# Patient Record
Sex: Male | Born: 1950 | Race: White | Hispanic: No | Marital: Married | Smoking: Former smoker
Health system: Southern US, Community
[De-identification: ages and names within clinical notes are randomized; demographics above are authoritative.]

## PROBLEM LIST (undated history)

## (undated) DIAGNOSIS — K219 Gastro-esophageal reflux disease without esophagitis: Secondary | ICD-10-CM

## (undated) DIAGNOSIS — D649 Anemia, unspecified: Secondary | ICD-10-CM

## (undated) DIAGNOSIS — E785 Hyperlipidemia, unspecified: Secondary | ICD-10-CM

## (undated) DIAGNOSIS — I10 Essential (primary) hypertension: Secondary | ICD-10-CM

## (undated) DIAGNOSIS — I499 Cardiac arrhythmia, unspecified: Secondary | ICD-10-CM

## (undated) DIAGNOSIS — C4490 Unspecified malignant neoplasm of skin, unspecified: Secondary | ICD-10-CM

## (undated) DIAGNOSIS — R011 Cardiac murmur, unspecified: Secondary | ICD-10-CM

## (undated) DIAGNOSIS — G319 Degenerative disease of nervous system, unspecified: Secondary | ICD-10-CM

## (undated) DIAGNOSIS — Z8719 Personal history of other diseases of the digestive system: Secondary | ICD-10-CM

## (undated) DIAGNOSIS — E119 Type 2 diabetes mellitus without complications: Secondary | ICD-10-CM

## (undated) HISTORY — DX: Gastro-esophageal reflux disease without esophagitis: K21.9

## (undated) HISTORY — DX: Unspecified malignant neoplasm of skin, unspecified: C44.90

## (undated) HISTORY — PX: ULNAR NERVE REPAIR: SHX2594

## (undated) HISTORY — PX: HEMORRHOID SURGERY: SHX153

## (undated) HISTORY — PX: CATARACT EXTRACTION, BILATERAL: SHX1313

## (undated) HISTORY — PX: APPENDECTOMY: SHX54

## (undated) HISTORY — DX: Anemia, unspecified: D64.9

## (undated) HISTORY — PX: POLYPECTOMY: SHX149

## (undated) HISTORY — PX: CHOLECYSTECTOMY: SHX55

## (undated) MED FILL — Iron Sucrose Inj 20 MG/ML (Fe Equiv): INTRAVENOUS | Qty: 10 | Status: AC

---

## 1999-04-03 ENCOUNTER — Ambulatory Visit: Admission: RE | Admit: 1999-04-03 | Discharge: 1999-04-03 | Payer: Self-pay

## 2004-06-12 ENCOUNTER — Emergency Department (HOSPITAL_COMMUNITY): Admission: EM | Admit: 2004-06-12 | Discharge: 2004-06-12 | Payer: Self-pay | Admitting: Emergency Medicine

## 2011-11-14 DIAGNOSIS — Z860100 Personal history of colon polyps, unspecified: Secondary | ICD-10-CM | POA: Insufficient documentation

## 2011-11-14 HISTORY — DX: Personal history of colon polyps, unspecified: Z86.0100

## 2011-11-14 HISTORY — DX: Personal history of colonic polyps: Z86.010

## 2012-08-28 DIAGNOSIS — I10 Essential (primary) hypertension: Secondary | ICD-10-CM

## 2012-08-28 DIAGNOSIS — M109 Gout, unspecified: Secondary | ICD-10-CM

## 2012-08-28 HISTORY — DX: Gout, unspecified: M10.9

## 2012-08-28 HISTORY — DX: Essential (primary) hypertension: I10

## 2015-10-06 DIAGNOSIS — R768 Other specified abnormal immunological findings in serum: Secondary | ICD-10-CM

## 2015-10-06 HISTORY — DX: Other specified abnormal immunological findings in serum: R76.8

## 2015-10-07 DIAGNOSIS — M65332 Trigger finger, left middle finger: Secondary | ICD-10-CM

## 2015-10-07 HISTORY — DX: Trigger finger, left middle finger: M65.332

## 2016-02-02 DIAGNOSIS — Z79899 Other long term (current) drug therapy: Secondary | ICD-10-CM

## 2016-02-02 HISTORY — DX: Other long term (current) drug therapy: Z79.899

## 2016-09-07 DIAGNOSIS — R269 Unspecified abnormalities of gait and mobility: Secondary | ICD-10-CM | POA: Insufficient documentation

## 2016-09-07 HISTORY — DX: Unspecified abnormalities of gait and mobility: R26.9

## 2016-09-08 DIAGNOSIS — G2581 Restless legs syndrome: Secondary | ICD-10-CM | POA: Insufficient documentation

## 2016-09-08 HISTORY — DX: Restless legs syndrome: G25.81

## 2017-11-06 DIAGNOSIS — R195 Other fecal abnormalities: Secondary | ICD-10-CM

## 2017-11-06 HISTORY — DX: Other fecal abnormalities: R19.5

## 2018-05-02 DIAGNOSIS — Z8719 Personal history of other diseases of the digestive system: Secondary | ICD-10-CM | POA: Insufficient documentation

## 2018-05-02 HISTORY — DX: Personal history of other diseases of the digestive system: Z87.19

## 2018-09-03 DIAGNOSIS — L304 Erythema intertrigo: Secondary | ICD-10-CM

## 2018-09-03 HISTORY — DX: Erythema intertrigo: L30.4

## 2020-03-04 DIAGNOSIS — E1169 Type 2 diabetes mellitus with other specified complication: Secondary | ICD-10-CM

## 2020-03-04 HISTORY — DX: Type 2 diabetes mellitus with other specified complication: E11.69

## 2021-10-27 DIAGNOSIS — Z8616 Personal history of COVID-19: Secondary | ICD-10-CM

## 2021-10-27 HISTORY — DX: Personal history of COVID-19: Z86.16

## 2021-11-10 ENCOUNTER — Emergency Department (HOSPITAL_COMMUNITY): Payer: Medicare PPO

## 2021-11-10 ENCOUNTER — Inpatient Hospital Stay (HOSPITAL_COMMUNITY): Payer: Medicare PPO

## 2021-11-10 ENCOUNTER — Other Ambulatory Visit: Payer: Self-pay

## 2021-11-10 ENCOUNTER — Encounter (HOSPITAL_COMMUNITY): Payer: Self-pay | Admitting: Emergency Medicine

## 2021-11-10 ENCOUNTER — Inpatient Hospital Stay (HOSPITAL_COMMUNITY)
Admission: EM | Admit: 2021-11-10 | Discharge: 2021-11-16 | DRG: 871 | Disposition: A | Discharge status: Home Health | Payer: Medicare PPO | Attending: Internal Medicine | Admitting: Internal Medicine

## 2021-11-10 DIAGNOSIS — J9601 Acute respiratory failure with hypoxia: Secondary | ICD-10-CM | POA: Diagnosis present

## 2021-11-10 DIAGNOSIS — R791 Abnormal coagulation profile: Secondary | ICD-10-CM | POA: Diagnosis present

## 2021-11-10 DIAGNOSIS — T380X5A Adverse effect of glucocorticoids and synthetic analogues, initial encounter: Secondary | ICD-10-CM | POA: Diagnosis present

## 2021-11-10 DIAGNOSIS — E785 Hyperlipidemia, unspecified: Secondary | ICD-10-CM | POA: Diagnosis present

## 2021-11-10 DIAGNOSIS — K219 Gastro-esophageal reflux disease without esophagitis: Secondary | ICD-10-CM | POA: Diagnosis present

## 2021-11-10 DIAGNOSIS — Z9109 Other allergy status, other than to drugs and biological substances: Secondary | ICD-10-CM

## 2021-11-10 DIAGNOSIS — A4189 Other specified sepsis: Principal | ICD-10-CM | POA: Diagnosis present

## 2021-11-10 DIAGNOSIS — K449 Diaphragmatic hernia without obstruction or gangrene: Secondary | ICD-10-CM | POA: Diagnosis present

## 2021-11-10 DIAGNOSIS — Z7982 Long term (current) use of aspirin: Secondary | ICD-10-CM

## 2021-11-10 DIAGNOSIS — Z993 Dependence on wheelchair: Secondary | ICD-10-CM | POA: Diagnosis not present

## 2021-11-10 DIAGNOSIS — G4733 Obstructive sleep apnea (adult) (pediatric): Secondary | ICD-10-CM | POA: Diagnosis present

## 2021-11-10 DIAGNOSIS — K2100 Gastro-esophageal reflux disease with esophagitis, without bleeding: Secondary | ICD-10-CM | POA: Diagnosis not present

## 2021-11-10 DIAGNOSIS — U071 COVID-19: Secondary | ICD-10-CM | POA: Diagnosis present

## 2021-11-10 DIAGNOSIS — Q453 Other congenital malformations of pancreas and pancreatic duct: Secondary | ICD-10-CM

## 2021-11-10 DIAGNOSIS — K8051 Calculus of bile duct without cholangitis or cholecystitis with obstruction: Secondary | ICD-10-CM | POA: Diagnosis not present

## 2021-11-10 DIAGNOSIS — G319 Degenerative disease of nervous system, unspecified: Secondary | ICD-10-CM | POA: Diagnosis present

## 2021-11-10 DIAGNOSIS — Z885 Allergy status to narcotic agent status: Secondary | ICD-10-CM

## 2021-11-10 DIAGNOSIS — R7401 Elevation of levels of liver transaminase levels: Secondary | ICD-10-CM | POA: Diagnosis present

## 2021-11-10 DIAGNOSIS — R197 Diarrhea, unspecified: Secondary | ICD-10-CM | POA: Diagnosis not present

## 2021-11-10 DIAGNOSIS — Z8719 Personal history of other diseases of the digestive system: Secondary | ICD-10-CM | POA: Diagnosis not present

## 2021-11-10 DIAGNOSIS — I480 Paroxysmal atrial fibrillation: Secondary | ICD-10-CM | POA: Diagnosis not present

## 2021-11-10 DIAGNOSIS — N400 Enlarged prostate without lower urinary tract symptoms: Secondary | ICD-10-CM | POA: Diagnosis present

## 2021-11-10 DIAGNOSIS — Z888 Allergy status to other drugs, medicaments and biological substances status: Secondary | ICD-10-CM

## 2021-11-10 DIAGNOSIS — R0902 Hypoxemia: Secondary | ICD-10-CM

## 2021-11-10 DIAGNOSIS — K8050 Calculus of bile duct without cholangitis or cholecystitis without obstruction: Secondary | ICD-10-CM | POA: Diagnosis not present

## 2021-11-10 DIAGNOSIS — R112 Nausea with vomiting, unspecified: Secondary | ICD-10-CM | POA: Diagnosis present

## 2021-11-10 DIAGNOSIS — E876 Hypokalemia: Secondary | ICD-10-CM

## 2021-11-10 DIAGNOSIS — R748 Abnormal levels of other serum enzymes: Secondary | ICD-10-CM | POA: Diagnosis not present

## 2021-11-10 DIAGNOSIS — D649 Anemia, unspecified: Secondary | ICD-10-CM | POA: Diagnosis present

## 2021-11-10 DIAGNOSIS — K8020 Calculus of gallbladder without cholecystitis without obstruction: Secondary | ICD-10-CM | POA: Diagnosis present

## 2021-11-10 DIAGNOSIS — I10 Essential (primary) hypertension: Secondary | ICD-10-CM | POA: Diagnosis present

## 2021-11-10 DIAGNOSIS — R609 Edema, unspecified: Secondary | ICD-10-CM | POA: Diagnosis not present

## 2021-11-10 DIAGNOSIS — Z7984 Long term (current) use of oral hypoglycemic drugs: Secondary | ICD-10-CM

## 2021-11-10 DIAGNOSIS — I517 Cardiomegaly: Secondary | ICD-10-CM | POA: Diagnosis present

## 2021-11-10 DIAGNOSIS — K8000 Calculus of gallbladder with acute cholecystitis without obstruction: Secondary | ICD-10-CM | POA: Diagnosis present

## 2021-11-10 DIAGNOSIS — R7989 Other specified abnormal findings of blood chemistry: Secondary | ICD-10-CM

## 2021-11-10 DIAGNOSIS — E1165 Type 2 diabetes mellitus with hyperglycemia: Secondary | ICD-10-CM | POA: Diagnosis present

## 2021-11-10 DIAGNOSIS — I4891 Unspecified atrial fibrillation: Secondary | ICD-10-CM | POA: Diagnosis present

## 2021-11-10 DIAGNOSIS — Z91040 Latex allergy status: Secondary | ICD-10-CM

## 2021-11-10 DIAGNOSIS — R001 Bradycardia, unspecified: Secondary | ICD-10-CM | POA: Diagnosis not present

## 2021-11-10 DIAGNOSIS — Z79899 Other long term (current) drug therapy: Secondary | ICD-10-CM

## 2021-11-10 DIAGNOSIS — E1169 Type 2 diabetes mellitus with other specified complication: Secondary | ICD-10-CM

## 2021-11-10 DIAGNOSIS — K8010 Calculus of gallbladder with chronic cholecystitis without obstruction: Secondary | ICD-10-CM | POA: Diagnosis not present

## 2021-11-10 DIAGNOSIS — I700 Atherosclerosis of aorta: Secondary | ICD-10-CM | POA: Diagnosis present

## 2021-11-10 HISTORY — DX: Calculus of gallbladder without cholecystitis without obstruction: K80.20

## 2021-11-10 HISTORY — DX: Essential (primary) hypertension: I10

## 2021-11-10 HISTORY — DX: Hyperlipidemia, unspecified: E78.5

## 2021-11-10 HISTORY — DX: Elevation of levels of liver transaminase levels: R74.01

## 2021-11-10 HISTORY — DX: Hypokalemia: E87.6

## 2021-11-10 HISTORY — DX: Cardiomegaly: I51.7

## 2021-11-10 HISTORY — DX: Type 2 diabetes mellitus without complications: E11.9

## 2021-11-10 HISTORY — DX: Degenerative disease of nervous system, unspecified: G31.9

## 2021-11-10 HISTORY — DX: Type 2 diabetes mellitus with other specified complication: E11.69

## 2021-11-10 LAB — CBC WITH DIFFERENTIAL/PLATELET
Abs Immature Granulocytes: 0.09 10*3/uL — ABNORMAL HIGH (ref 0.00–0.07)
Basophils Absolute: 0 10*3/uL (ref 0.0–0.1)
Basophils Relative: 0 %
Eosinophils Absolute: 0 10*3/uL (ref 0.0–0.5)
Eosinophils Relative: 0 %
HCT: 36.3 % — ABNORMAL LOW (ref 39.0–52.0)
Hemoglobin: 11.7 g/dL — ABNORMAL LOW (ref 13.0–17.0)
Immature Granulocytes: 1 %
Lymphocytes Relative: 4 %
Lymphs Abs: 0.6 10*3/uL — ABNORMAL LOW (ref 0.7–4.0)
MCH: 28.5 pg (ref 26.0–34.0)
MCHC: 32.2 g/dL (ref 30.0–36.0)
MCV: 88.5 fL (ref 80.0–100.0)
Monocytes Absolute: 1.2 10*3/uL — ABNORMAL HIGH (ref 0.1–1.0)
Monocytes Relative: 8 %
Neutro Abs: 12.8 10*3/uL — ABNORMAL HIGH (ref 1.7–7.7)
Neutrophils Relative %: 87 %
Platelets: 217 10*3/uL (ref 150–400)
RBC: 4.1 MIL/uL — ABNORMAL LOW (ref 4.22–5.81)
RDW: 14.6 % (ref 11.5–15.5)
WBC: 14.6 10*3/uL — ABNORMAL HIGH (ref 4.0–10.5)
nRBC: 0 % (ref 0.0–0.2)

## 2021-11-10 LAB — COMPREHENSIVE METABOLIC PANEL
ALT: 208 U/L — ABNORMAL HIGH (ref 0–44)
AST: 192 U/L — ABNORMAL HIGH (ref 15–41)
Albumin: 3.3 g/dL — ABNORMAL LOW (ref 3.5–5.0)
Alkaline Phosphatase: 86 U/L (ref 38–126)
Anion gap: 15 (ref 5–15)
BUN: 12 mg/dL (ref 8–23)
CO2: 28 mmol/L (ref 22–32)
Calcium: 8.4 mg/dL — ABNORMAL LOW (ref 8.9–10.3)
Chloride: 96 mmol/L — ABNORMAL LOW (ref 98–111)
Creatinine, Ser: 1.15 mg/dL (ref 0.61–1.24)
GFR, Estimated: >60 mL/min (ref >60)
Glucose, Bld: 191 mg/dL — ABNORMAL HIGH (ref 70–99)
Potassium: 3.3 mmol/L — ABNORMAL LOW (ref 3.5–5.1)
Sodium: 139 mmol/L (ref 135–145)
Total Bilirubin: 3.9 mg/dL — ABNORMAL HIGH (ref 0.3–1.2)
Total Protein: 7.4 g/dL (ref 6.5–8.1)

## 2021-11-10 LAB — FERRITIN: Ferritin: 60 ng/mL (ref 24–336)

## 2021-11-10 LAB — LACTIC ACID, PLASMA
Lactic Acid, Venous: 3.2 mmol/L (ref 0.5–1.9)
Lactic Acid, Venous: 1.3 mmol/L (ref 0.5–1.9)

## 2021-11-10 LAB — URINALYSIS, ROUTINE W REFLEX MICROSCOPIC
Glucose, UA: NEGATIVE mg/dL
Hgb urine dipstick: NEGATIVE
Ketones, ur: 40 mg/dL — AB
Leukocytes,Ua: NEGATIVE
Nitrite: NEGATIVE
Protein, ur: 30 mg/dL — AB
Specific Gravity, Urine: 1.015 (ref 1.005–1.030)
pH: 7.5 (ref 5.0–8.0)

## 2021-11-10 LAB — URINALYSIS, MICROSCOPIC (REFLEX): Bacteria, UA: NONE SEEN

## 2021-11-10 LAB — CBG MONITORING, ED: Glucose-Capillary: 181 mg/dL — ABNORMAL HIGH (ref 70–99)

## 2021-11-10 LAB — LACTATE DEHYDROGENASE: LDH: 228 U/L — ABNORMAL HIGH (ref 98–192)

## 2021-11-10 LAB — D-DIMER, QUANTITATIVE: D-Dimer, Quant: 2.44 ug/mL-FEU — ABNORMAL HIGH (ref 0.00–0.50)

## 2021-11-10 LAB — C-REACTIVE PROTEIN: CRP: 12.5 mg/dL — ABNORMAL HIGH (ref <1.0)

## 2021-11-10 LAB — GLUCOSE, CAPILLARY: Glucose-Capillary: 238 mg/dL — ABNORMAL HIGH (ref 70–99)

## 2021-11-10 LAB — FIBRINOGEN: Fibrinogen: 659 mg/dL — ABNORMAL HIGH (ref 210–475)

## 2021-11-10 LAB — TROPONIN I (HIGH SENSITIVITY)
Troponin I (High Sensitivity): 40 ng/L — ABNORMAL HIGH (ref <18)
Troponin I (High Sensitivity): 44 ng/L — ABNORMAL HIGH (ref <18)

## 2021-11-10 LAB — HEMOGLOBIN A1C
Hgb A1c MFr Bld: 7.3 % — ABNORMAL HIGH (ref 4.8–5.6)
Mean Plasma Glucose: 162.81 mg/dL

## 2021-11-10 LAB — RESP PANEL BY RT-PCR (FLU A&B, COVID) ARPGX2
Influenza A by PCR: NEGATIVE
Influenza B by PCR: NEGATIVE
SARS Coronavirus 2 by RT PCR: POSITIVE — AB

## 2021-11-10 LAB — PROCALCITONIN: Procalcitonin: 3.63 ng/mL

## 2021-11-10 LAB — PROTIME-INR
INR: 1 (ref 0.8–1.2)
Prothrombin Time: 13.4 seconds (ref 11.4–15.2)

## 2021-11-10 LAB — HEPATITIS B SURFACE ANTIGEN: Hepatitis B Surface Ag: NONREACTIVE

## 2021-11-10 LAB — BRAIN NATRIURETIC PEPTIDE: B Natriuretic Peptide: 426.9 pg/mL — ABNORMAL HIGH (ref 0.0–100.0)

## 2021-11-10 MED ORDER — SODIUM CHLORIDE 0.9% FLUSH
3.0000 mL | Freq: Two times a day (BID) | INTRAVENOUS | Status: DC
  Administered 2021-11-10 – 2021-11-16 (×12): 3 mL via INTRAVENOUS

## 2021-11-10 MED ORDER — SODIUM CHLORIDE 0.9 % IV SOLN
2.0000 g | INTRAVENOUS | Status: AC
  Administered 2021-11-10 – 2021-11-14 (×5): 2 g via INTRAVENOUS
  Filled 2021-11-10 (×6): qty 20

## 2021-11-10 MED ORDER — TIMOLOL MALEATE 0.5 % OP SOLN
1.0000 [drp] | Freq: Every day | OPHTHALMIC | Status: DC
  Administered 2021-11-10 – 2021-11-16 (×7): 1 [drp] via OPHTHALMIC
  Filled 2021-11-10 (×2): qty 5

## 2021-11-10 MED ORDER — SODIUM CHLORIDE 0.9 % IV SOLN
500.0000 mg | INTRAVENOUS | Status: DC
  Administered 2021-11-10 – 2021-11-11 (×2): 500 mg via INTRAVENOUS
  Filled 2021-11-10 (×4): qty 5

## 2021-11-10 MED ORDER — LACTATED RINGERS IV SOLN: INTRAVENOUS | Status: DC

## 2021-11-10 MED ORDER — ONDANSETRON HCL 4 MG/2ML IJ SOLN
4.0000 mg | Freq: Four times a day (QID) | INTRAMUSCULAR | Status: DC | PRN
  Administered 2021-11-10: 4 mg via INTRAVENOUS
  Filled 2021-11-10: qty 2

## 2021-11-10 MED ORDER — ALLOPURINOL 100 MG PO TABS
100.0000 mg | ORAL_TABLET | Freq: Every morning | ORAL | Status: DC
  Administered 2021-11-11 – 2021-11-16 (×6): 100 mg via ORAL
  Filled 2021-11-10 (×6): qty 1

## 2021-11-10 MED ORDER — ZINC SULFATE 220 (50 ZN) MG PO CAPS
220.0000 mg | ORAL_CAPSULE | Freq: Every day | ORAL | Status: DC
  Administered 2021-11-10 – 2021-11-16 (×7): 220 mg via ORAL
  Filled 2021-11-10 (×7): qty 1

## 2021-11-10 MED ORDER — LINAGLIPTIN 5 MG PO TABS
5.0000 mg | ORAL_TABLET | Freq: Every day | ORAL | Status: DC
  Administered 2021-11-10 – 2021-11-16 (×7): 5 mg via ORAL
  Filled 2021-11-10 (×7): qty 1

## 2021-11-10 MED ORDER — POTASSIUM CHLORIDE CRYS ER 20 MEQ PO TBCR
60.0000 meq | EXTENDED_RELEASE_TABLET | ORAL | Status: AC
  Administered 2021-11-10: 60 meq via ORAL
  Filled 2021-11-10: qty 3

## 2021-11-10 MED ORDER — GUAIFENESIN-DM 100-10 MG/5ML PO SYRP
10.0000 mL | ORAL_SOLUTION | ORAL | Status: DC | PRN
  Filled 2021-11-10: qty 10

## 2021-11-10 MED ORDER — INSULIN ASPART 100 UNIT/ML IJ SOLN
0.0000 [IU] | Freq: Three times a day (TID) | INTRAMUSCULAR | Status: DC
  Administered 2021-11-10: 2 [IU] via SUBCUTANEOUS
  Administered 2021-11-11: 3 [IU] via SUBCUTANEOUS
  Administered 2021-11-11: 2 [IU] via SUBCUTANEOUS
  Administered 2021-11-11: 1 [IU] via SUBCUTANEOUS
  Administered 2021-11-12: 5 [IU] via SUBCUTANEOUS
  Administered 2021-11-12: 1 [IU] via SUBCUTANEOUS
  Administered 2021-11-12: 2 [IU] via SUBCUTANEOUS
  Administered 2021-11-13: 1 [IU] via SUBCUTANEOUS
  Administered 2021-11-13: 3 [IU] via SUBCUTANEOUS

## 2021-11-10 MED ORDER — ACETAMINOPHEN 500 MG PO TABS
1000.0000 mg | ORAL_TABLET | Freq: Once | ORAL | Status: AC
  Administered 2021-11-10: 1000 mg via ORAL
  Filled 2021-11-10: qty 2

## 2021-11-10 MED ORDER — FUROSEMIDE 20 MG PO TABS
20.0000 mg | ORAL_TABLET | Freq: Every day | ORAL | Status: DC
  Administered 2021-11-11 – 2021-11-12 (×2): 20 mg via ORAL
  Filled 2021-11-10 (×2): qty 1

## 2021-11-10 MED ORDER — DEXAMETHASONE 6 MG PO TABS
6.0000 mg | ORAL_TABLET | Freq: Every day | ORAL | Status: DC
  Administered 2021-11-10 – 2021-11-12 (×3): 6 mg via ORAL
  Filled 2021-11-10: qty 1
  Filled 2021-11-10: qty 2
  Filled 2021-11-10: qty 1

## 2021-11-10 MED ORDER — ALBUTEROL SULFATE HFA 108 (90 BASE) MCG/ACT IN AERS
2.0000 | INHALATION_SPRAY | Freq: Four times a day (QID) | RESPIRATORY_TRACT | Status: DC
  Administered 2021-11-10 – 2021-11-15 (×17): 2 via RESPIRATORY_TRACT
  Filled 2021-11-10 (×2): qty 6.7

## 2021-11-10 MED ORDER — ASPIRIN EC 81 MG PO TBEC
81.0000 mg | DELAYED_RELEASE_TABLET | Freq: Every evening | ORAL | Status: DC
  Administered 2021-11-10 – 2021-11-13 (×4): 81 mg via ORAL
  Filled 2021-11-10 (×3): qty 1

## 2021-11-10 MED ORDER — TAMSULOSIN HCL 0.4 MG PO CAPS
0.4000 mg | ORAL_CAPSULE | Freq: Every day | ORAL | Status: DC
  Administered 2021-11-10 – 2021-11-16 (×7): 0.4 mg via ORAL
  Filled 2021-11-10 (×7): qty 1

## 2021-11-10 MED ORDER — FELODIPINE ER 5 MG PO TB24
10.0000 mg | ORAL_TABLET | Freq: Every day | ORAL | Status: DC
  Administered 2021-11-11 – 2021-11-16 (×6): 10 mg via ORAL
  Filled 2021-11-10 (×2): qty 2
  Filled 2021-11-10: qty 1
  Filled 2021-11-10 (×3): qty 2
  Filled 2021-11-10: qty 1
  Filled 2021-11-10: qty 2

## 2021-11-10 MED ORDER — ASCORBIC ACID 500 MG PO TABS
500.0000 mg | ORAL_TABLET | Freq: Every day | ORAL | Status: DC
  Administered 2021-11-10 – 2021-11-16 (×7): 500 mg via ORAL
  Filled 2021-11-10 (×7): qty 1

## 2021-11-10 MED ORDER — IRBESARTAN 300 MG PO TABS
300.0000 mg | ORAL_TABLET | Freq: Every day | ORAL | Status: DC
  Administered 2021-11-11 – 2021-11-12 (×2): 300 mg via ORAL
  Filled 2021-11-10 (×3): qty 1

## 2021-11-10 MED ORDER — ONDANSETRON HCL 4 MG PO TABS: 4.0000 mg | ORAL_TABLET | Freq: Four times a day (QID) | ORAL | Status: DC | PRN

## 2021-11-10 MED ORDER — CLONIDINE HCL 0.2 MG PO TABS
0.2000 mg | ORAL_TABLET | Freq: Two times a day (BID) | ORAL | Status: DC
  Administered 2021-11-10 – 2021-11-12 (×6): 0.2 mg via ORAL
  Filled 2021-11-10 (×7): qty 1

## 2021-11-10 MED ORDER — ENOXAPARIN SODIUM 40 MG/0.4ML IJ SOSY
40.0000 mg | PREFILLED_SYRINGE | Freq: Every day | INTRAMUSCULAR | Status: DC
Start: 2021-11-10 — End: 2021-11-14
  Administered 2021-11-10 – 2021-11-14 (×5): 40 mg via SUBCUTANEOUS
  Filled 2021-11-10 (×5): qty 0.4

## 2021-11-10 MED ORDER — IOHEXOL 350 MG/ML SOLN
100.0000 mL | Freq: Once | INTRAVENOUS | Status: AC | PRN
  Administered 2021-11-10: 100 mL via INTRAVENOUS

## 2021-11-10 MED ORDER — PANTOPRAZOLE SODIUM 40 MG PO TBEC
40.0000 mg | DELAYED_RELEASE_TABLET | Freq: Every day | ORAL | Status: DC
  Administered 2021-11-10 – 2021-11-16 (×7): 40 mg via ORAL
  Filled 2021-11-10 (×7): qty 1

## 2021-11-10 MED ORDER — INSULIN ASPART 100 UNIT/ML IJ SOLN
0.0000 [IU] | Freq: Every day | INTRAMUSCULAR | Status: DC
  Administered 2021-11-11 – 2021-11-13 (×3): 3 [IU] via SUBCUTANEOUS
  Administered 2021-11-14: 2 [IU] via SUBCUTANEOUS
  Administered 2021-11-15: 4 [IU] via SUBCUTANEOUS
  Administered 2021-11-15: 5 [IU] via SUBCUTANEOUS

## 2021-11-10 MED ORDER — GADOBUTROL 1 MMOL/ML IV SOLN
10.0000 mL | Freq: Once | INTRAVENOUS | Status: AC | PRN
  Administered 2021-11-10: 10 mL via INTRAVENOUS

## 2021-11-10 MED ORDER — LACTATED RINGERS IV BOLUS (SEPSIS)
1000.0000 mL | Freq: Once | INTRAVENOUS | Status: AC
  Administered 2021-11-10: 1000 mL via INTRAVENOUS

## 2021-11-10 MED ORDER — BENZONATATE 100 MG PO CAPS
100.0000 mg | ORAL_CAPSULE | Freq: Once | ORAL | Status: AC
  Administered 2021-11-10: 100 mg via ORAL
  Filled 2021-11-10: qty 1

## 2021-11-10 MED ORDER — CARVEDILOL 25 MG PO TABS
25.0000 mg | ORAL_TABLET | Freq: Two times a day (BID) | ORAL | Status: DC
  Administered 2021-11-10 – 2021-11-12 (×6): 25 mg via ORAL
  Filled 2021-11-10 (×5): qty 1
  Filled 2021-11-10: qty 2
  Filled 2021-11-10: qty 1

## 2021-11-10 NOTE — ED Triage Notes (Addendum)
Pt reports positive home covid test on Monday. Pt reports nasal congestion, headache, fever, SpO2 88% with home pulse oximeter, although denies shortness of breath. However, when talking to family member, she states that he had covid on 12/25, took five day course of Paxlovid, symptoms resolved, but since Monday patient has developed nausea, congestion, chest tightness, and headache.

## 2021-11-10 NOTE — H&P (Addendum)
History and Physical    Anthony Mueller UUV:[id number] DOB: 1951/02/17 DOA: 11/10/2021  Referring MD/NP/PA: Marye Round, MD PCP: Pcp, No  Patient coming from: Home  Chief Complaint: Cough and shortness of breath  I have personally briefly reviewed patient's old medical records in [place]   HPI: Anthony Mueller is a 71 y.o. 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  History is obtained from patient and family present at bedside.  He had tested positive initially on 12/25 with an at home test.  Reported having nonproductive cough.  He had notified his primary care provider and was started on Paxlovid and completed a 5-day course of the medication which he completed.  He initially started to feel better, but noted return of symptoms shortly thereafter.  His wife states that his cough progressively worsened although it is not productive.  Noted associated symptoms nausea, vomiting, diarrhea, dizziness, generalized weakness, and fevers up to 100 F yesterday at home.  He had been vaccinated and boosted x3.  Patient denied any complaints of any significant abdominal pain during this time.  They thought possibly had a urinary tract infection  ED course: Upon admission into the emergency department patient was seen to be febrile up to 101.6 F, pulse t 95-100, respirations 10-23, blood pressures elevated up to 174/77, and O2 saturations noted to be as low as 88% with improvement on 2 L nasal cannula oxygen.  Labs significant for WBC 14.6, hemoglobin 11.7, potassium 3.3, and lactic acid 1.3.  Urinalysis showed no signs of infection. Chest x-ray noted moderate cardiac enlargement increased from previous x-rays from 04/2016 with large hiatal hernia.  He had been treated with acetaminophen, Tessalon Perles, Rocephin,  azithromycin, and 1 L lactated Ringer's.   Review of Systems  Constitutional:  Positive for fever and malaise/fatigue.  HENT:  Positive for congestion.   Eyes:  Negative for photophobia and pain.  Respiratory:  Positive for cough, shortness of breath and wheezing.   Cardiovascular:  Negative for chest pain.  Gastrointestinal:  Positive for diarrhea, nausea and vomiting. Negative for abdominal pain.  Genitourinary:  Negative for dysuria.  Neurological:  Negative for weakness.  Psychiatric/Behavioral:  Negative for substance abuse.    Past Medical History:  Diagnosis Date   Cerebellar degeneration ([place])    Diabetes mellitus without complication ([place])    Hyperlipidemia    Hypertension      reports that he has never smoked. He has never used smokeless tobacco. He reports that he does not currently use alcohol. He reports that he does not use drugs.  Allergies  Allergen Reactions   Liraglutide     Other reaction(s): Other (See Comments) CAN NOT TAKE BECAUSE A SIDE EFFECT IS PANCREATITIS AND HE HAS A HISTORY Contraindicated due to pancreatitis    Hydrocodone     Other reaction(s): Other (See Comments) MAKES FEEL SPACEY AND LIGHT HEADED   Hydrocodone-Acetaminophen     Other reaction(s): Other (See Comments) Feels " orbity"   Other     Other reaction(s): Other (See Comments) BANDAID  CAUSES RASH   Tape Other (See Comments)    Skin irritation, welts   Latex Rash    Band-aids   Metronidazole Nausea And Vomiting    Other reaction(s): GI Upset (intolerance)  AND DEPRESSION ;  TABLETS Does fine w/ IV Flagyl     History  reviewed. No pertinent family history.  Prior to Admission medications   Medication Sig Start Date End Date Taking? Authorizing Provider  allopurinol (ZYLOPRIM) 100 MG tablet Take 100 mg by mouth in the morning.   Yes [provider]  allopurinol (ZYLOPRIM) 300 MG tablet Take 300 mg by mouth every evening. 10/29/21  Yes [provider]   aspirin EC 81 MG tablet Take 81 mg by mouth every evening. Swallow whole.   Yes [provider]  candesartan (ATACAND) 32 MG tablet Take 32 mg by mouth daily. 09/13/21  Yes [provider]  carvedilol (COREG) 25 MG tablet Take 25 mg by mouth 2 (two) times daily. 10/19/21  Yes [provider]  cloNIDine (CATAPRES) 0.2 MG tablet Take 0.2 mg by mouth 2 (two) times daily. 10/28/21  Yes [provider]  felodipine (PLENDIL) 10 MG 24 hr tablet Take 10 mg by mouth daily. 10/28/21  Yes [provider]  Ferrous Sulfate (IRON SLOW RELEASE) 143 (45 Fe) MG TBCR Take 143 mg by mouth every evening.   Yes [provider]  furosemide (LASIX) 20 MG tablet Take 20 mg by mouth daily. 08/30/21  Yes [provider]  lovastatin (MEVACOR) 40 MG tablet Take 40 mg by mouth every evening. 09/20/21  Yes [provider]  metFORMIN (GLUCOPHAGE) 500 MG tablet Take 1,000 mg by mouth 2 (two) times daily. 08/17/21  Yes [provider]  omeprazole (PRILOSEC) 20 MG capsule Take 20 mg by mouth daily. 11/04/21  Yes [provider]  tamsulosin (FLOMAX) 0.4 MG CAPS capsule Take 0.4 mg by mouth daily. 08/30/21  Yes [provider]  timolol (TIMOPTIC) 0.5 % ophthalmic solution Place 1 drop into both eyes daily. 09/13/21  Yes [provider]  TRADJENTA 5 MG TABS tablet Take 5 mg by mouth daily. 09/20/21  Yes [provider]    Physical Exam:  Constitutional: Elderly male who appears to be ill in no acute distress Vitals:   11/10/21 0945 11/10/21 1000 11/10/21 1015 11/10/21 1045  BP: (!) 147/81 132/85 (!) 135/94 (!) 147/96  Pulse: 98 95 100 97  Resp: 10 (!) 23 18 14   Temp:   99.4 F (37.4 C)   TempSrc:   Oral   SpO2: (!) 88% 91% 93% 95%  Weight:      Height:       Eyes: Disconjugate gaze appreciated ENMT: Mucous membranes are moist. Posterior pharynx clear of any exudate or lesions.  Neck: normal, supple, no  masses, no thyromegaly Respiratory: Lungs sound relatively clear with mild expiratory wheeze appreciated.  No significant rales or rhonchi.  Currently O2 saturations maintained on 2 L nasal cannula oxygen. Cardiovascular: Regular rate and rhythm, no murmurs / rubs / gallops.  1+ pitting lower extremity edema.   Abdomen: Protuberant abdomen without significant tenderness to palpation.  Bowel sounds present. Musculoskeletal: no clubbing / cyanosis.  Skin: no rashes, lesions, ulcers. No induration Neurologic: CN 2-12 grossly intact.  Psychiatric: Normal judgment and insight. Alert and oriented x 3. Normal mood.     Labs on Admission: I have personally reviewed following labs and imaging studies  CBC: Recent Labs  Lab 11/10/21 0806  WBC 14.6*  NEUTROABS 12.8*  HGB 11.7*  HCT 36.3*  MCV 88.5  PLT 914   Basic Metabolic Panel: Recent Labs  Lab 11/10/21 0806  NA 139  K 3.3*  CL 96*  CO2 28  GLUCOSE 191*  BUN 12  CREATININE 1.15  CALCIUM 8.4*  GFR: Estimated Creatinine Clearance: 77.8 mL/min (by C-G formula based on SCr of 1.15 mg/dL). Liver Function Tests: Recent Labs  Lab 11/10/21 0806  AST 192*  ALT 208*  ALKPHOS 86  BILITOT 3.9*  PROT 7.4  ALBUMIN 3.3*   No results for input(s): LIPASE, AMYLASE in the last 168 hours. No results for input(s): AMMONIA in the last 168 hours. Coagulation Profile: Recent Labs  Lab 11/10/21 0806  INR 1.0   Cardiac Enzymes: No results for input(s): CKTOTAL, CKMB, CKMBINDEX, TROPONINI in the last 168 hours. BNP (last 3 results) No results for input(s): PROBNP in the last 8760 hours. HbA1C: No results for input(s): HGBA1C in the last 72 hours. CBG: No results for input(s): GLUCAP in the last 168 hours. Lipid Profile: No results for input(s): CHOL, HDL, LDLCALC, TRIG, CHOLHDL, LDLDIRECT in the last 72 hours. Thyroid Function Tests: No results for input(s): TSH, T4TOTAL, FREET4, T3FREE, THYROIDAB in the last 72 hours. Anemia  Panel: No results for input(s): VITAMINB12, FOLATE, FERRITIN, TIBC, IRON, RETICCTPCT in the last 72 hours. Urine analysis: No results found for: COLORURINE, APPEARANCEUR, LABSPEC, PHURINE, GLUCOSEU, HGBUR, BILIRUBINUR, KETONESUR, PROTEINUR, UROBILINOGEN, NITRITE, LEUKOCYTESUR Sepsis Labs: Recent Results (from the past 240 hour(s))  Resp Panel by RT-PCR (Flu A&B, Covid) Nasopharyngeal Swab     Status: Abnormal   Collection Time: 11/10/21  7:56 AM   Specimen: Nasopharyngeal Swab; Nasopharyngeal(NP) swabs in vial transport medium  Result Value Ref Range Status   SARS Coronavirus 2 by RT PCR POSITIVE (A) NEGATIVE Final    Comment: (NOTE) SARS-CoV-2 target nucleic acids are DETECTED.  The SARS-CoV-2 RNA is generally detectable in upper respiratory specimens during the acute phase of infection. Positive results are indicative of the presence of the identified virus, but do not rule out bacterial infection or co-infection with other pathogens not detected by the test. Clinical correlation with patient history and other diagnostic information is necessary to determine patient infection status. The expected result is Negative.  Fact Sheet for Patients: EntrepreneurPulse.com.au  Fact Sheet for Healthcare Providers: IncredibleEmployment.be  This test is not yet approved or cleared by the [place] FDA and  has been authorized for detection and/or diagnosis of SARS-CoV-2 by FDA under an Emergency Use Authorization (EUA).  This EUA will remain in effect (meaning this test can be used) for the duration of  the COVID-19 declaration under Section 564(b)(1) of the A ct, 21 U.S.C. section 360bbb-3(b)(1), unless the authorization is terminated or revoked sooner.     Influenza A by PCR NEGATIVE NEGATIVE Final   Influenza B by PCR NEGATIVE NEGATIVE Final    Comment: (NOTE) The Xpert Xpress SARS-CoV-2/FLU/RSV plus assay is intended as an aid in the diagnosis  of influenza from Nasopharyngeal swab specimens and should not be used as a sole basis for treatment. Nasal washings and aspirates are unacceptable for Xpert Xpress SARS-CoV-2/FLU/RSV testing.  Fact Sheet for Patients: EntrepreneurPulse.com.au  Fact Sheet for Healthcare Providers: IncredibleEmployment.be  This test is not yet approved or cleared by the [place] FDA and has been authorized for detection and/or diagnosis of SARS-CoV-2 by FDA under an Emergency Use Authorization (EUA). This EUA will remain in effect (meaning this test can be used) for the duration of the COVID-19 declaration under Section 564(b)(1) of the Act, 21 U.S.C. section 360bbb-3(b)(1), unless the authorization is terminated or revoked.  Performed at [facility], [place] [address], [place], [place] [address]      Radiological Exams on Admission: DG Chest 2 View  Result Date: 11/10/2021 CLINICAL DATA:  Sepsis, shortness of breath and weakness. EXAM: CHEST - 2 VIEW COMPARISON:  05/19/2016 and CT chest 01/30/2015. FINDINGS: Trachea is midline. Heart is enlarged, increased from 05/19/2016. No airspace consolidation or pleural fluid. Large hiatal hernia. IMPRESSION: 1. No acute findings. 2. Moderate cardiac enlargement, increased from 05/19/2016, despite differences in technique. 3. Large hiatal hernia. Electronically Signed   By: Lorin Picket M.D.   On: 11/10/2021 08:23    EKG: Independently reviewed.  96 bpm with significant background artifact  Assessment/Plan Sepsis secondary to acute respiratory failure with hypoxia due to COVID-19: Patient presents after being initially diagnosed with COVID-19 at the end of December and had completed 5-day course of Paxlovid, but developed rebound symptoms with nausea, vomiting, diarrhea, and cough.  Fever up to 101.6 F with tachypnea and O2 saturations noted to be as low as 88% on room air with improvement on 2 L of nasal cannula  oxygen.  Labs noted WBC elevated at 14.6 meeting SIRS criteria.  Chest x-ray was otherwise noted to be clear with moderate cardiac enlargement.  COVID-19 screening was still positive.  Blood cultures have been obtained and patient has been started on empiric antibiotics. -Admit to telemetry bed -COVID-19 order set utilized -Continuous pulse oximetry to maintain O2 saturation greater than 92% -Check inflammatory markers -Follow-up CT angiogram of the chest -Albuterol inhaler -Decadron 6 mg daily -Vitamin C and -Acetaminophen as needed for fever -Antitussives as needed for cough -Continue empiric antibiotics of Rocephin and azithromycin   Nausea, vomiting, diarrhea: Acute.  Symptoms of nausea, vomiting, and diarrhea were thought initially to be secondary to possible rebound Covid-19 symptoms. -Monitor intake and output -Antiemetics as needed  Cardiomegaly: Acute.  Patient was insulin dentally noted to have an enlarged cardiac silhouette.  No prior history of heart failure reported. -Follow-up BNP -Check echocardiogram  Cholelithiasis: Acute.  Abdominal ultrasound with note of 11 mm density at the gallbladder neck with dilatation of the common bile duct concerning for possible obstruction and thickening of the gallbladder wall to suggest chronic cholecystitis without sonographic signs of acute cholecystitis.  Question possibility of obstruction as the reason for elevated liver enzymes. -Check MRCP -Dr. Ardis Hughs of [facility] consulted and states they will formally see in a.m.  Hypokalemia: Acute.  Potassium mildly low at 3.3.  Suspect secondary to patient's reports of nausea, vomiting, and diarrhea. -Give 60 mEq potassium chloride p.o -continue to monitor and replace as needed  Essential hypertension: Home blood pressure medication regimen includes candesartan 32 mg daily, Coreg 25 mg twice daily, clonidine 0.2 mg twice daily, felodipine 10 mg daily, and furosemide 20 mg daily. -Continue  current medication regimen  Diabetes mellitus type 2: On admission glucose elevated at 191.  Home medication regimen includes Tradjenta and metformin -Hypoglycemic protocols -Hold metformin -Continue Tradjenta -CBGs before every meal with sensitive SSI -Adjust insulin regimen as needed while on steroids  Cerebellar degeneration: At baseline patient is wheelchair-bound due to the history of cerebellar degeneration family cares for him at home with use of [employer] lifts. -PT consulted to eval and treat  Hyperlipidemia -Held statin due to elevated liver enzyme  Transaminitis and hyperbilirubinemia  cholelithiasis: On admission AST 192, ALT 208, and total bilirubin 3.9.  Suspect possibly secondary to acute blockage. -Recheck CMP tomorrow  BPH -Continue Flomax  GERD  hiatal hernia: Patient was noted to have large hiatal hernia on chest x-ray.  He also has history of dysphagia related to esophageal stricture requiring dilation. -Continue  pharmacy substitution of Protonix  DVT prophylaxis: Lovenox Code Status: Full Family Communication: Wife updated at bedside Disposition Plan: Hopefully discharge home once medically stable Consults called: GI Admission status: Inpatient, to require more than 2 midnight stay  Norval Morton MD [facility]   If 7PM-7AM, please contact night-coverage   11/10/2021, 11:23 AM

## 2021-11-10 NOTE — ED Notes (Signed)
Lactic acid 3.2

## 2021-11-10 NOTE — ED Provider Notes (Signed)
[facility] EMERGENCY DEPARTMENT Provider Note   CSN: [id number] Arrival date & time: 11/10/21  0744     History  Chief Complaint  Patient presents with   Fever    Anthony Mueller is a 71 y.o. male.   Fever  Patient has a history of diabetes, hypertension, hyperlipidemia, obstructive sleep apnea, anal neoplasM and a chronic neurologic condition that has left him wheelchair-bound.  Patient presents with increasing weakness and cough.  Patient symptoms started this past week.  Initially he tested positive for COVID on the 25th.  He took a 5-day course of paxlovid.  His symptoms started to get better but in the last 5 days they have returned..  Patient has been having persistent nasal congestion headaches and fevers.  He has become increasingly weak.  Normally he is able to push himself in the wheelchair but not today.  He also had difficulty just getting in the car.  They have been monitoring his oxygen at home and it has been dropping into the 80s so he came to the ED for evaluation.  Home Medications Prior to Admission medications   Medication Sig Start Date End Date Taking? Authorizing Provider  allopurinol (ZYLOPRIM) 100 MG tablet Take 100 mg by mouth in the morning.   Yes [provider]  allopurinol (ZYLOPRIM) 300 MG tablet Take 300 mg by mouth every evening. 10/29/21  Yes [provider]  aspirin EC 81 MG tablet Take 81 mg by mouth every evening. Swallow whole.   Yes [provider]  candesartan (ATACAND) 32 MG tablet Take 32 mg by mouth daily. 09/13/21  Yes [provider]  carvedilol (COREG) 25 MG tablet Take 25 mg by mouth 2 (two) times daily. 10/19/21  Yes [provider]  cloNIDine (CATAPRES) 0.2 MG tablet Take 0.2 mg by mouth 2 (two) times daily. 10/28/21  Yes [provider]  felodipine (PLENDIL) 10 MG 24 hr tablet Take 10 mg by mouth daily. 10/28/21  Yes [provider]  Ferrous Sulfate (IRON  SLOW RELEASE) 143 (45 Fe) MG TBCR Take 143 mg by mouth every evening.   Yes [provider]  furosemide (LASIX) 20 MG tablet Take 20 mg by mouth daily. 08/30/21  Yes [provider]  lovastatin (MEVACOR) 40 MG tablet Take 40 mg by mouth every evening. 09/20/21  Yes [provider]  metFORMIN (GLUCOPHAGE) 500 MG tablet Take 1,000 mg by mouth 2 (two) times daily. 08/17/21  Yes [provider]  omeprazole (PRILOSEC) 20 MG capsule Take 20 mg by mouth daily. 11/04/21  Yes [provider]  tamsulosin (FLOMAX) 0.4 MG CAPS capsule Take 0.4 mg by mouth daily. 08/30/21  Yes [provider]  timolol (TIMOPTIC) 0.5 % ophthalmic solution Place 1 drop into both eyes daily. 09/13/21  Yes [provider]  TRADJENTA 5 MG TABS tablet Take 5 mg by mouth daily. 09/20/21  Yes [provider]      Allergies    Liraglutide, Hydrocodone, Hydrocodone-acetaminophen, Other, Tape, Latex, and Metronidazole    Review of Systems   Review of Systems  Constitutional:  Positive for fever.   Physical Exam Updated Vital Signs BP (!) 147/96    Pulse 97    Temp 99.4 F (37.4 C) (Oral)    Resp 14    Ht 1.854 m (6\' 1" )    Wt 113.4 kg    SpO2 95%    BMI 32.98 kg/m  Physical Exam Vitals and nursing note reviewed.  Constitutional:  General: He is not in acute distress.    Appearance: He is well-developed.     Comments: Oxygen saturation in the low 90s at rest at the bedside  HENT:     Head: Normocephalic and atraumatic.     Right Ear: External ear normal.     Left Ear: External ear normal.  Eyes:     General: No scleral icterus.       Right eye: No discharge.        Left eye: No discharge.     Conjunctiva/sclera: Conjunctivae normal.  Neck:     Trachea: No tracheal deviation.  Cardiovascular:     Rate and Rhythm: Normal rate and regular rhythm.  Pulmonary:     Effort: Pulmonary effort is normal. No respiratory distress.     Breath sounds:  Normal breath sounds. No stridor. No wheezing or rales.     Comments: Frequent coughing Abdominal:     General: Bowel sounds are normal. There is no distension.     Palpations: Abdomen is soft.     Tenderness: There is no abdominal tenderness. There is no guarding or rebound.  Musculoskeletal:        General: No tenderness or deformity.     Cervical back: Neck supple.  Skin:    General: Skin is warm and dry.     Findings: No rash.  Neurological:     General: No focal deficit present.     Mental Status: He is alert.     Cranial Nerves: No cranial nerve deficit (no facial droop, extraocular movements intact, no slurred speech).     Sensory: No sensory deficit.     Motor: Weakness (Generalized) present. No abnormal muscle tone or seizure activity.     Coordination: Coordination normal.  Psychiatric:        Mood and Affect: Mood normal.    ED Results / Procedures / Treatments   Labs (all labs ordered are listed, but only abnormal results are displayed) Labs Reviewed  RESP PANEL BY RT-PCR (FLU A&B, COVID) ARPGX2 - Abnormal; Notable for the following components:      Result Value   SARS Coronavirus 2 by RT PCR POSITIVE (*)    All other components within normal limits  COMPREHENSIVE METABOLIC PANEL - Abnormal; Notable for the following components:   Potassium 3.3 (*)    Chloride 96 (*)    Glucose, Bld 191 (*)    Calcium 8.4 (*)    Albumin 3.3 (*)    AST 192 (*)    ALT 208 (*)    Total Bilirubin 3.9 (*)    All other components within normal limits  LACTIC ACID, PLASMA - Abnormal; Notable for the following components:   Lactic Acid, Venous 3.2 (*)    All other components within normal limits  CBC WITH DIFFERENTIAL/PLATELET - Abnormal; Notable for the following components:   WBC 14.6 (*)    RBC 4.10 (*)    Hemoglobin 11.7 (*)    HCT 36.3 (*)    Neutro Abs 12.8 (*)    Lymphs Abs 0.6 (*)    Monocytes Absolute 1.2 (*)    Abs Immature Granulocytes 0.09 (*)    All other  components within normal limits  CULTURE, BLOOD (ROUTINE X 2)  CULTURE, BLOOD (ROUTINE X 2)  PROTIME-INR  LACTIC ACID, PLASMA  URINALYSIS, ROUTINE W REFLEX MICROSCOPIC    EKG EKG Interpretation  Date/Time:  Wednesday November 10 2021 07:54:00 EST Ventricular Rate:  96 PR Interval:  300 QRS Duration: 90 QT Interval:  334 QTC Calculation: 421 R Axis:   64 Text Interpretation: Undetermined rhythm ST & T wave abnormality, consider inferolateral ischemia Abnormal ECG Poor data quality Confirmed by Dorie Rank [phone]) on 11/10/2021 8:58:13 AM  Radiology DG Chest 2 View  Result Date: 11/10/2021 CLINICAL DATA:  Sepsis, shortness of breath and weakness. EXAM: CHEST - 2 VIEW COMPARISON:  05/19/2016 and CT chest 01/30/2015. FINDINGS: Trachea is midline. Heart is enlarged, increased from 05/19/2016. No airspace consolidation or pleural fluid. Large hiatal hernia. IMPRESSION: 1. No acute findings. 2. Moderate cardiac enlargement, increased from 05/19/2016, despite differences in technique. 3. Large hiatal hernia. Electronically Signed   By: Lorin Picket M.D.   On: 11/10/2021 08:23    Procedures Procedures    Medications Ordered in ED Medications  lactated ringers infusion ( Intravenous New Bag/Given 11/10/21 1029)  lactated ringers bolus 1,000 mL (1,000 mLs Intravenous New Bag/Given 11/10/21 1027)  cefTRIAXone (ROCEPHIN) 2 g in sodium chloride 0.9 % 100 mL IVPB (2 g Intravenous New Bag/Given 11/10/21 1019)  azithromycin (ZITHROMAX) 500 mg in sodium chloride 0.9 % 250 mL IVPB (500 mg Intravenous New Bag/Given 11/10/21 1033)  acetaminophen (TYLENOL) tablet 1,000 mg (1,000 mg Oral Given 11/10/21 0757)  benzonatate (TESSALON) capsule 100 mg (100 mg Oral Given 11/10/21 1030)    ED Course/ Medical Decision Making/ A&P Clinical Course as of 11/10/21 1636  Wed Nov 10, 2021  1006 Oxygen saturation decreasing into the high 80s.  Now on 2 l [place]. [JK]  1016 CBC shows leukocytosis of 14.  Metabolic panel  shows elevated LFTs and hyperbilirubinemia [JK]  1020 Chest x-ray images and report reviewed.  No acute pneumonia noted.  Cardiomegaly and hiatal hernia noted [JK]  1057 COVID test positive [JK]  1130 Discussed with Dr Tamala Julian.  Will admit [JK]    Clinical Course User Index [JK] Dorie Rank, MD                           Medical Decision Making Problems Addressed: COVID-19 virus infection: complicated acute illness or injury    Details: Patient presents withPersistent cough and new oxygen requirement after COVID virus infection.  Patient is outside any acute treatment window.  No definite pneumonia on chest x-ray.  However with his persistent cough and oxygen requirement suspect either subclinical pneumonia or viral COVID-pneumonia causing his hypoxia.  I have started on empiric antibiotics with his elevated lactic acid level and fever.  No signs of severe sepsis at this time.  No hypotension.  Patient has been treated with IV antibiotics and IV fluids. Elevated LFTs: acute illness or injury    Details: Elevated LFTs noted.  Patient denies any abdominal tenderness.  We will proceed withAnd ultrasound.  Doubt cholecystitis cholelithiasis Exam at this time but will follow up on imaging results. Hypoxia: acute illness or injury    Details: New onset most likely due to his COVID infection.  Will order CT angiogram to rule out PE considering hisNew oxygen requirement and relatively normal chest x-ray  Amount and/or Complexity of Data Reviewed Labs: ordered. Decision-making details documented in ED Course. Radiology: ordered and independent interpretation performed. Decision-making details documented in ED Course. ECG/medicine tests: ordered. Decision-making details documented in ED Course.  Risk Drug therapy requiring intensive monitoring for toxicity. Decision regarding hospitalization.          Final Clinical Impression(s) / ED Diagnoses Final diagnoses:  COVID-19 virus infection  Hypoxia  Elevated LFTs     Dorie Rank, MD 11/10/21 [phone]

## 2021-11-11 ENCOUNTER — Inpatient Hospital Stay (HOSPITAL_COMMUNITY): Payer: Medicare PPO

## 2021-11-11 ENCOUNTER — Encounter (HOSPITAL_COMMUNITY): Payer: Self-pay | Admitting: Internal Medicine

## 2021-11-11 DIAGNOSIS — I517 Cardiomegaly: Secondary | ICD-10-CM | POA: Diagnosis not present

## 2021-11-11 DIAGNOSIS — U071 COVID-19: Secondary | ICD-10-CM | POA: Diagnosis not present

## 2021-11-11 DIAGNOSIS — R609 Edema, unspecified: Secondary | ICD-10-CM

## 2021-11-11 DIAGNOSIS — K8010 Calculus of gallbladder with chronic cholecystitis without obstruction: Secondary | ICD-10-CM

## 2021-11-11 DIAGNOSIS — K8050 Calculus of bile duct without cholangitis or cholecystitis without obstruction: Secondary | ICD-10-CM

## 2021-11-11 DIAGNOSIS — R748 Abnormal levels of other serum enzymes: Secondary | ICD-10-CM | POA: Diagnosis not present

## 2021-11-11 DIAGNOSIS — K2100 Gastro-esophageal reflux disease with esophagitis, without bleeding: Secondary | ICD-10-CM

## 2021-11-11 DIAGNOSIS — R7989 Other specified abnormal findings of blood chemistry: Secondary | ICD-10-CM | POA: Diagnosis not present

## 2021-11-11 LAB — COMPREHENSIVE METABOLIC PANEL
ALT: 136 U/L — ABNORMAL HIGH (ref 0–44)
AST: 79 U/L — ABNORMAL HIGH (ref 15–41)
Albumin: 2.6 g/dL — ABNORMAL LOW (ref 3.5–5.0)
Alkaline Phosphatase: 64 U/L (ref 38–126)
Anion gap: 11 (ref 5–15)
BUN: 14 mg/dL (ref 8–23)
CO2: 29 mmol/L (ref 22–32)
Calcium: 7.8 mg/dL — ABNORMAL LOW (ref 8.9–10.3)
Chloride: 98 mmol/L (ref 98–111)
Creatinine, Ser: 1.18 mg/dL (ref 0.61–1.24)
GFR, Estimated: >60 mL/min (ref >60)
Glucose, Bld: 222 mg/dL — ABNORMAL HIGH (ref 70–99)
Potassium: 3.7 mmol/L (ref 3.5–5.1)
Sodium: 138 mmol/L (ref 135–145)
Total Bilirubin: 1.5 mg/dL — ABNORMAL HIGH (ref 0.3–1.2)
Total Protein: 6 g/dL — ABNORMAL LOW (ref 6.5–8.1)

## 2021-11-11 LAB — ECHOCARDIOGRAM COMPLETE
AR max vel: 3.25 cm2
AV Peak grad: 6.1 mmHg
Ao pk vel: 1.24 m/s
Area-P 1/2: 7.82 cm2
Height: 73 in
MV M vel: 4.89 m/s
MV Peak grad: 95.6 mmHg
S' Lateral: 3.4 cm
Weight: 4000 oz

## 2021-11-11 LAB — CBC WITH DIFFERENTIAL/PLATELET
Abs Immature Granulocytes: 0.1 10*3/uL — ABNORMAL HIGH (ref 0.00–0.07)
Basophils Absolute: 0 10*3/uL (ref 0.0–0.1)
Basophils Relative: 0 %
Eosinophils Absolute: 0 10*3/uL (ref 0.0–0.5)
Eosinophils Relative: 0 %
HCT: 28.1 % — ABNORMAL LOW (ref 39.0–52.0)
Hemoglobin: 9.4 g/dL — ABNORMAL LOW (ref 13.0–17.0)
Immature Granulocytes: 1 %
Lymphocytes Relative: 7 %
Lymphs Abs: 0.9 10*3/uL (ref 0.7–4.0)
MCH: 29.5 pg (ref 26.0–34.0)
MCHC: 33.5 g/dL (ref 30.0–36.0)
MCV: 88.1 fL (ref 80.0–100.0)
Monocytes Absolute: 0.8 10*3/uL (ref 0.1–1.0)
Monocytes Relative: 6 %
Neutro Abs: 11.2 10*3/uL — ABNORMAL HIGH (ref 1.7–7.7)
Neutrophils Relative %: 86 %
Platelets: 171 10*3/uL (ref 150–400)
RBC: 3.19 MIL/uL — ABNORMAL LOW (ref 4.22–5.81)
RDW: 14.8 % (ref 11.5–15.5)
WBC: 13 10*3/uL — ABNORMAL HIGH (ref 4.0–10.5)
nRBC: 0 % (ref 0.0–0.2)

## 2021-11-11 LAB — LIPASE, BLOOD: Lipase: 46 U/L (ref 11–51)

## 2021-11-11 LAB — PHOSPHORUS: Phosphorus: 2.7 mg/dL (ref 2.5–4.6)

## 2021-11-11 LAB — GLUCOSE, CAPILLARY
Glucose-Capillary: 174 mg/dL — ABNORMAL HIGH (ref 70–99)
Glucose-Capillary: 148 mg/dL — ABNORMAL HIGH (ref 70–99)
Glucose-Capillary: 228 mg/dL — ABNORMAL HIGH (ref 70–99)
Glucose-Capillary: 251 mg/dL — ABNORMAL HIGH (ref 70–99)

## 2021-11-11 LAB — MAGNESIUM
Magnesium: 1.1 mg/dL — ABNORMAL LOW (ref 1.7–2.4)
Magnesium: 1.4 mg/dL — ABNORMAL LOW (ref 1.7–2.4)

## 2021-11-11 LAB — D-DIMER, QUANTITATIVE: D-Dimer, Quant: 2.22 ug/mL-FEU — ABNORMAL HIGH (ref 0.00–0.50)

## 2021-11-11 LAB — C-REACTIVE PROTEIN: CRP: 16.9 mg/dL — ABNORMAL HIGH (ref <1.0)

## 2021-11-11 MED ORDER — MAGNESIUM SULFATE 50 % IJ SOLN: 4.0000 g | Freq: Once | INTRAVENOUS | Status: DC

## 2021-11-11 MED ORDER — MAGNESIUM SULFATE 4 GM/100ML IV SOLN
4.0000 g | Freq: Once | INTRAVENOUS | Status: AC
  Administered 2021-11-11: 4 g via INTRAVENOUS
  Filled 2021-11-11: qty 100

## 2021-11-11 MED ORDER — MAGNESIUM SULFATE 2 GM/50ML IV SOLN
2.0000 g | Freq: Once | INTRAVENOUS | Status: AC
  Administered 2021-11-11: 2 g via INTRAVENOUS
  Filled 2021-11-11: qty 50

## 2021-11-11 NOTE — Assessment & Plan Note (Signed)
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

## 2021-11-11 NOTE — Assessment & Plan Note (Addendum)
-   Patient tested positive with COVID-19 on 12/25, has completed 5-day course of Paxlovid.  -CTA negative for acute PE, venous Dopplers negative for DVT. -Continue prednisone taper,  -much improved, currently not requiring any O2, O2 sats 99 200% on room air.  Did not meet criteria for home O2.

## 2021-11-11 NOTE — Hospital Course (Addendum)
Patient is a 71 year old male with hypertension, hyperlipidemia, diabetes mellitus type 2, OSA on CPAP, and cerebellar degeneration who is wheelchair-bound at baseline presented with shortness of breath, fevers, chills, worsening cough.  Also reported nausea vomiting diarrhea, dizziness, generalized weakness.  Febrile at the time of admission 101.6.  Currently, pulse 95 200, BP 174/77.  Hypoxia with O2 sats 88% on room air. Patient had tested positive for COVID on 12/25 and had completed 5-day course of Paxlovid.  Also noted to have transaminitis, concern for cholecystitis on abdominal ultrasound and MRCP.  GI, general surgery consulted.  No plans for surgical intervention, recommended outpatient follow-up Noted to have atrial fibrillation with bradycardia, sinus pauses during hospitalization.  Cardiology consulted. Started on apixaban Currently off O2, feels back to baseline.

## 2021-11-11 NOTE — Consult Note (Signed)
Anthony Mueller 1950/12/15  [id number].    Requesting MD: Dr. 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  He was started on Paxlovid by his PCP and began feeling better.  He then redeveloped symptoms of congestion, low grade fevers between 99-100.6, dizziness which led to some nausea, but no vomiting, diarrhea or SOB, despite O2 sats in the 85-88% range.  Because of this new findings, his PCP recommended he present to the ED for evaluation.  Upon arrival, he was still COVID +, his CTA of the chest was unremarkable, but he was noted to have a temp of 101.6, WBC of 13, and some elevation of his LFTs (TB, AST/ALT).  He underwent an [place] that confirmed cholelithiasis with some possible wall thickening and biliary obstruction could not be ruled out.  He then underwent an MRCP with no evidence of obstruction and really equivocal for cholecystitis.  The patient denies any biliary symptoms such as post prandial pain or nausea.  He has been eating well with no issues.  He denies any abdominal pain whatsoever as well.  His LFTs are down trending today.  His LFTs in care everywhere were normal in Oct of 2022.  We have been asked to see him for further evaluation.  ROS: ROS: Please see HPI, otherwise all other systems have been reviewed and are negative.  History reviewed. No pertinent family history.  Past Medical History:  Diagnosis Date   Cerebellar degeneration ([place])    Diabetes mellitus without complication ([place])    Hyperlipidemia    Hypertension     History reviewed. No pertinent surgical history.  Social History:  reports that he has never smoked. He has never used smokeless tobacco. He reports that he does not currently use alcohol. He reports that he does not use  drugs.  Allergies:  Allergies  Allergen Reactions   Liraglutide     Other reaction(s): Other (See Comments) CAN NOT TAKE BECAUSE A SIDE EFFECT IS PANCREATITIS AND HE HAS A HISTORY Contraindicated due to pancreatitis    Hydrocodone     Other reaction(s): Other (See Comments) MAKES FEEL SPACEY AND LIGHT HEADED   Hydrocodone-Acetaminophen     Other reaction(s): Other (See Comments) Feels " orbity"   Other     Other reaction(s): Other (See Comments) BANDAID  CAUSES RASH   Tape Other (See Comments)    Skin irritation, welts   Latex Rash    Band-aids   Metronidazole Nausea And Vomiting    Other reaction(s): GI Upset (intolerance)  AND DEPRESSION ;  TABLETS Does fine w/ IV Flagyl     Medications Prior to Admission  Medication Sig Dispense Refill   allopurinol (ZYLOPRIM) 100 MG tablet Take 100 mg by mouth in the morning.     allopurinol (ZYLOPRIM) 300 MG tablet Take 300 mg by mouth every evening.     aspirin EC 81 MG tablet Take 81 mg by mouth every evening. Swallow whole.     candesartan (ATACAND) 32 MG tablet Take 32 mg by mouth daily.     carvedilol (COREG) 25 MG tablet Take 25 mg by mouth 2 (two) times daily.     cloNIDine (CATAPRES) 0.2 MG tablet Take 0.2 mg by mouth 2 (two) times daily.     felodipine (  PLENDIL) 10 MG 24 hr tablet Take 10 mg by mouth daily.     Ferrous Sulfate (IRON SLOW RELEASE) 143 (45 Fe) MG TBCR Take 143 mg by mouth every evening.     furosemide (LASIX) 20 MG tablet Take 20 mg by mouth daily.     lovastatin (MEVACOR) 40 MG tablet Take 40 mg by mouth every evening.     metFORMIN (GLUCOPHAGE) 500 MG tablet Take 1,000 mg by mouth 2 (two) times daily.     omeprazole (PRILOSEC) 20 MG capsule Take 20 mg by mouth daily.     tamsulosin (FLOMAX) 0.4 MG CAPS capsule Take 0.4 mg by mouth daily.     timolol (TIMOPTIC) 0.5 % ophthalmic solution Place 1 drop into both eyes daily.     TRADJENTA 5 MG TABS tablet Take 5 mg by mouth daily.       Physical Exam: Blood  pressure 137/81, pulse 72, temperature 97.6 F (36.4 C), temperature source Oral, resp. rate 18, height 6\' 1"  (1.854 m), weight 113.4 kg, SpO2 94 %. General: pleasant, WD, WN white male who is laying in bed in NAD HEENT: head is normocephalic, atraumatic.  Sclera are noninjected.  PERRL.  Ears and nose without any masses or lesions.  Mouth is pink and moist Heart: regular, rate, and rhythm.  Normal s1,s2. No obvious murmurs, gallops, or rubs noted.  Palpable radial and pedal pulses bilaterally Lungs: CTAB, no wheezes, rhonchi, or rales noted.  Respiratory effort nonlabored on 2L [place] Abd: soft, completely NT, ND, +BS, no masses, hernias, or organomegaly.  + rectus diastasis. MS: all 4 extremities are symmetrical with no cyanosis, clubbing, or edema. Skin: warm and dry with no masses, lesions, or rashes Neuro: Cranial nerves 2-12 grossly intact, sensation is normal throughout Psych: A&Ox3 with an appropriate affect.   Results for orders placed or performed during the hospital encounter of 11/10/21 (from the past 48 hour(s))  Resp Panel by RT-PCR (Flu A&B, Covid) Nasopharyngeal Swab     Status: Abnormal   Collection Time: 11/10/21  7:56 AM   Specimen: Nasopharyngeal Swab; Nasopharyngeal(NP) swabs in vial transport medium  Result Value Ref Range   SARS Coronavirus 2 by RT PCR POSITIVE (A) NEGATIVE    Comment: (NOTE) SARS-CoV-2 target nucleic acids are DETECTED.  The SARS-CoV-2 RNA is generally detectable in upper respiratory specimens during the acute phase of infection. Positive results are indicative of the presence of the identified virus, but do not rule out bacterial infection or co-infection with other pathogens not detected by the test. Clinical correlation with patient history and other diagnostic information is necessary to determine patient infection status. The expected result is Negative.  Fact Sheet for Patients: EntrepreneurPulse.com.au  Fact Sheet for  Healthcare Providers: IncredibleEmployment.be  This test is not yet approved or cleared by the [place] FDA and  has been authorized for detection and/or diagnosis of SARS-CoV-2 by FDA under an Emergency Use Authorization (EUA).  This EUA will remain in effect (meaning this test can be used) for the duration of  the COVID-19 declaration under Section 564(b)(1) of the A ct, 21 U.S.C. section 360bbb-3(b)(1), unless the authorization is terminated or revoked sooner.     Influenza A by PCR NEGATIVE NEGATIVE   Influenza B by PCR NEGATIVE NEGATIVE    Comment: (NOTE) The Xpert Xpress SARS-CoV-2/FLU/RSV plus assay is intended as an aid in the diagnosis of influenza from Nasopharyngeal swab specimens and should not be used as a sole basis for treatment. Nasal washings  and aspirates are unacceptable for Xpert Xpress SARS-CoV-2/FLU/RSV testing.  Fact Sheet for Patients: EntrepreneurPulse.com.au  Fact Sheet for Healthcare Providers: IncredibleEmployment.be  This test is not yet approved or cleared by the [place] FDA and has been authorized for detection and/or diagnosis of SARS-CoV-2 by FDA under an Emergency Use Authorization (EUA). This EUA will remain in effect (meaning this test can be used) for the duration of the COVID-19 declaration under Section 564(b)(1) of the Act, 21 U.S.C. section 360bbb-3(b)(1), unless the authorization is terminated or revoked.  Performed at [facility], [place] [address] [place], [place] [address]   Comprehensive metabolic panel     Status: Abnormal   Collection Time: 11/10/21  8:06 AM  Result Value Ref Range   Sodium 139 135 - 145 mmol/L   Potassium 3.3 (L) 3.5 - 5.1 mmol/L   Chloride 96 (L) 98 - 111 mmol/L   CO2 28 22 - 32 mmol/L   Glucose, Bld 191 (H) 70 - 99 mg/dL    Comment: Glucose reference range applies only to samples taken after fasting for at least 8 hours.   BUN 12 8 -  23 mg/dL   Creatinine, Ser 1.15 0.61 - 1.24 mg/dL   Calcium 8.4 (L) 8.9 - 10.3 mg/dL   Total Protein 7.4 6.5 - 8.1 g/dL   Albumin 3.3 (L) 3.5 - 5.0 g/dL   AST 192 (H) 15 - 41 U/L   ALT 208 (H) 0 - 44 U/L   Alkaline Phosphatase 86 38 - 126 U/L   Total Bilirubin 3.9 (H) 0.3 - 1.2 mg/dL   GFR, Estimated >60 >60 mL/min    Comment: (NOTE) Calculated using the CKD-EPI Creatinine Equation (2021)    Anion gap 15 5 - 15    Comment: Performed at [facility], [place] [address] [place], [place] [address]  Lactic acid, plasma     Status: Abnormal   Collection Time: 11/10/21  8:06 AM  Result Value Ref Range   Lactic Acid, Venous 3.2 (HH) 0.5 - 1.9 mmol/L    Comment: CRITICAL RESULT CALLED TO, READ BACK BY AND VERIFIED WITH: S.COOKE,RN 11/10/2021 0850 DAVISB Performed at [facility], [place] [address], [place], [place] [address]   CBC with Differential     Status: Abnormal   Collection Time: 11/10/21  8:06 AM  Result Value Ref Range   WBC 14.6 (H) 4.0 - 10.5 K/uL   RBC 4.10 (L) 4.22 - 5.81 MIL/uL   Hemoglobin 11.7 (L) 13.0 - 17.0 g/dL   HCT 36.3 (L) 39.0 - 52.0 %   MCV 88.5 80.0 - 100.0 fL   MCH 28.5 26.0 - 34.0 pg   MCHC 32.2 30.0 - 36.0 g/dL   RDW 14.6 11.5 - 15.5 %   Platelets 217 150 - 400 K/uL   nRBC 0.0 0.0 - 0.2 %   Neutrophils Relative % 87 %   Neutro Abs 12.8 (H) 1.7 - 7.7 K/uL   Lymphocytes Relative 4 %   Lymphs Abs 0.6 (L) 0.7 - 4.0 K/uL   Monocytes Relative 8 %   Monocytes Absolute 1.2 (H) 0.1 - 1.0 K/uL   Eosinophils Relative 0 %   Eosinophils Absolute 0.0 0.0 - 0.5 K/uL   Basophils Relative 0 %   Basophils Absolute 0.0 0.0 - 0.1 K/uL   Immature Granulocytes 1 %   Abs Immature Granulocytes 0.09 (H) 0.00 - 0.07 K/uL    Comment: Performed at [facility]  [address] [place], [place] [address]  Protime-INR     Status: None   Collection Time: 11/10/21  8:06 AM  Result Value Ref Range   Prothrombin Time 13.4 11.4 - 15.2 seconds   INR 1.0 0.8 - 1.2     Comment: (NOTE) INR goal varies based on device and disease states. Performed at [facility], [place] [address] [place], [place] [address]   Culture, blood (Routine x 2)     Status: None (Preliminary result)   Collection Time: 11/10/21  8:06 AM   Specimen: BLOOD  Result Value Ref Range   Specimen Description BLOOD LEFT ANTECUBITAL    Special Requests      BOTTLES DRAWN AEROBIC AND ANAEROBIC Blood Culture results may not be optimal due to an excessive volume of blood received in culture bottles   Culture      NO GROWTH < 12 HOURS Performed at [facility] [address], [place], [place] [address]    Report Status PENDING   Lactic acid, plasma     Status: None   Collection Time: 11/10/21 10:17 AM  Result Value Ref Range   Lactic Acid, Venous 1.3 0.5 - 1.9 mmol/L    Comment: Performed at [facility] [address] [place], [place] [address]  Culture, blood (Routine x 2)     Status: None (Preliminary result)   Collection Time: 11/10/21 10:17 AM   Specimen: BLOOD RIGHT FOREARM  Result Value Ref Range   Specimen Description BLOOD RIGHT FOREARM    Special Requests      BOTTLES DRAWN AEROBIC AND ANAEROBIC Blood Culture adequate volume   Culture      NO GROWTH < 12 HOURS Performed at [facility], [place] [address] [place], [place] [address]    Report Status PENDING   Urinalysis, Routine w reflex microscopic     Status: Abnormal   Collection Time: 11/10/21 11:58 AM  Result Value Ref Range   Color, Urine YELLOW YELLOW   APPearance CLEAR CLEAR   Specific Gravity, Urine 1.015 1.005 - 1.030   pH 7.5 5.0 - 8.0   Glucose, UA NEGATIVE NEGATIVE mg/dL   Hgb urine dipstick NEGATIVE NEGATIVE   Bilirubin Urine MODERATE (A) NEGATIVE   Ketones, ur 40 (A) NEGATIVE mg/dL   Protein, ur 30 (A) NEGATIVE mg/dL   Nitrite NEGATIVE NEGATIVE   Leukocytes,Ua NEGATIVE NEGATIVE    Comment: Performed at [facility] [address] [place], [place] [address]  Urinalysis,  Microscopic (reflex)     Status: None   Collection Time: 11/10/21 11:58 AM  Result Value Ref Range   RBC / HPF 0-5 0 - 5 RBC/hpf   WBC, UA 0-5 0 - 5 WBC/hpf   Bacteria, UA NONE SEEN NONE SEEN   Squamous Epithelial / LPF 0-5 0 - 5    Comment: Performed at [facility], [place] [address], [place],  [address]  Brain natriuretic peptide     Status: Abnormal   Collection Time: 11/10/21 12:14 PM  Result Value Ref Range   B Natriuretic Peptide 426.9 (H) 0.0 - 100.0 pg/mL    Comment: Performed at [facility] [address], [place],  [address]  C-reactive protein     Status: Abnormal   Collection Time: 11/10/21 12:14 PM  Result Value Ref Range   CRP 12.5 (H) <1.0 mg/dL    Comment: Performed at [facility] [address], [place], [place]  [address]  D-dimer, quantitative     Status: Abnormal   Collection Time: 11/10/21 12:14 PM  Result Value Ref Range   D-Dimer, Quant 2.44 (H) 0.00 - 0.50 ug/mL-FEU    Comment: (NOTE) At the manufacturer cut-off value of 0.5 g/mL FEU, this assay has a negative predictive value of 95-100%.This assay is intended for use in conjunction with a clinical pretest probability (PTP) assessment model to exclude pulmonary embolism (PE) and deep venous thrombosis (DVT) in outpatients suspected of PE or DVT. Results should be correlated with clinical presentation. Performed at [facility], [place] [address], [place], [place] [address]   Fibrinogen     Status: Abnormal   Collection Time: 11/10/21 12:14 PM  Result Value Ref Range   Fibrinogen 659 (H) 210 - 475 mg/dL    Comment: (NOTE) Fibrinogen results may be underestimated in patients receiving thrombolytic therapy. Performed at [facility], [place] [address] [place], [place] [address]   Ferritin     Status: None   Collection Time: 11/10/21 12:14 PM  Result Value Ref Range   Ferritin 60 24 - 336 ng/mL    Comment: Performed at [facility] [address],  [place], [place] [address]  Hepatitis B surface antigen     Status: None   Collection Time: 11/10/21 12:14 PM  Result Value Ref Range   Hepatitis B Surface Ag NON REACTIVE NON REACTIVE    Comment: Performed at [facility] [address] [place], [place] [address]  Procalcitonin     Status: None   Collection Time: 11/10/21 12:14 PM  Result Value Ref Range   Procalcitonin 3.63 ng/mL    Comment:        Interpretation: PCT > 2 ng/mL: Systemic infection (sepsis) is likely, unless other causes are known. (NOTE)       Sepsis PCT Algorithm           Lower Respiratory Tract                                      Infection PCT Algorithm    ----------------------------     ----------------------------         PCT < 0.25 ng/mL                PCT < 0.10 ng/mL          Strongly encourage             Strongly discourage   discontinuation of antibiotics    initiation of antibiotics    ----------------------------     -----------------------------       PCT 0.25 - 0.50 ng/mL            PCT 0.10 - 0.25 ng/mL               OR       >80% decrease in PCT            Discourage initiation of                                            antibiotics      Encourage discontinuation           of antibiotics    ----------------------------     -----------------------------  PCT >= 0.50 ng/mL              PCT 0.26 - 0.50 ng/mL               AND       <80% decrease in PCT              Encourage initiation of                                             antibiotics       Encourage continuation           of antibiotics    ----------------------------     -----------------------------        PCT >= 0.50 ng/mL                  PCT > 0.50 ng/mL               AND         increase in PCT                  Strongly encourage                                      initiation of antibiotics    Strongly encourage escalation           of antibiotics                                      -----------------------------                                           PCT <= 0.25 ng/mL                                                 OR                                        > 80% decrease in PCT                                      Discontinue / Do not initiate                                             antibiotics  Performed at [facility] [address] [place], [place] [address]   Lactate dehydrogenase     Status: Abnormal   Collection Time: 11/10/21 12:14 PM  Result Value Ref Range   LDH 228 (H) 98 - 192 U/L    Comment: Performed at [facility], [place] [address] [place], [place] [address]  Troponin  I (High Sensitivity)     Status: Abnormal   Collection Time: 11/10/21 12:14 PM  Result Value Ref Range   Troponin I (High Sensitivity) 40 (H) <18 ng/L    Comment: (NOTE) Elevated high sensitivity troponin I (hsTnI) values and significant  changes across serial measurements may suggest ACS but many other  chronic and acute conditions are known to elevate hsTnI results.  Refer to the Links section for chest pain algorithms and additional  guidance. Performed at [facility], [place] [address] [place], [place] [address]   Hemoglobin A1c     Status: Abnormal   Collection Time: 11/10/21 12:14 PM  Result Value Ref Range   Hgb A1c MFr Bld 7.3 (H) 4.8 - 5.6 %    Comment: (NOTE) Pre diabetes:          5.7%-6.4%  Diabetes:              >6.4%  Glycemic control for   <7.0% adults with diabetes    Mean Plasma Glucose 162.81 mg/dL    Comment: Performed at [facility] [address], [place], [place] [address]  CBG monitoring, ED     Status: Abnormal   Collection Time: 11/10/21 12:28 PM  Result Value Ref Range   Glucose-Capillary 181 (H) 70 - 99 mg/dL    Comment: Glucose reference range applies only to samples taken after fasting for at least 8 hours.  Troponin I (High Sensitivity)     Status: Abnormal   Collection Time: 11/10/21  1:24  PM  Result Value Ref Range   Troponin I (High Sensitivity) 44 (H) <18 ng/L    Comment: (NOTE) Elevated high sensitivity troponin I (hsTnI) values and significant  changes across serial measurements may suggest ACS but many other  chronic and acute conditions are known to elevate hsTnI results.  Refer to the "Links" section for chest pain algorithms and additional  guidance. Performed at [facility], [place] [address], [place], [place] [address]   Glucose, capillary     Status: Abnormal   Collection Time: 11/10/21 10:32 PM  Result Value Ref Range   Glucose-Capillary 238 (H) 70 - 99 mg/dL    Comment: Glucose reference range applies only to samples taken after fasting for at least 8 hours.  CBC with Differential/Platelet     Status: Abnormal   Collection Time: 11/11/21  3:50 AM  Result Value Ref Range   WBC 13.0 (H) 4.0 - 10.5 K/uL   RBC 3.19 (L) 4.22 - 5.81 MIL/uL   Hemoglobin 9.4 (L) 13.0 - 17.0 g/dL   HCT 28.1 (L) 39.0 - 52.0 %   MCV 88.1 80.0 - 100.0 fL   MCH 29.5 26.0 - 34.0 pg   MCHC 33.5 30.0 - 36.0 g/dL   RDW 14.8 11.5 - 15.5 %   Platelets 171 150 - 400 K/uL   nRBC 0.0 0.0 - 0.2 %   Neutrophils Relative % 86 %   Neutro Abs 11.2 (H) 1.7 - 7.7 K/uL   Lymphocytes Relative 7 %   Lymphs Abs 0.9 0.7 - 4.0 K/uL   Monocytes Relative 6 %   Monocytes Absolute 0.8 0.1 - 1.0 K/uL   Eosinophils Relative 0 %   Eosinophils Absolute 0.0 0.0 - 0.5 K/uL   Basophils Relative 0 %   Basophils Absolute 0.0 0.0 - 0.1 K/uL   Immature Granulocytes 1 %   Abs Immature Granulocytes 0.10 (H) 0.00 - 0.07 K/uL    Comment: Performed  at [facility], [place] [address] [place], [place] [address]  Comprehensive metabolic panel     Status: Abnormal   Collection Time: 11/11/21  3:50 AM  Result Value Ref Range   Sodium 138 135 - 145 mmol/L   Potassium 3.7 3.5 - 5.1 mmol/L   Chloride 98 98 - 111 mmol/L   CO2 29 22 - 32 mmol/L   Glucose, Bld 222 (H) 70 - 99 mg/dL    Comment: Glucose reference  range applies only to samples taken after fasting for at least 8 hours.   BUN 14 8 - 23 mg/dL   Creatinine, Ser 1.18 0.61 - 1.24 mg/dL   Calcium 7.8 (L) 8.9 - 10.3 mg/dL   Total Protein 6.0 (L) 6.5 - 8.1 g/dL   Albumin 2.6 (L) 3.5 - 5.0 g/dL   AST 79 (H) 15 - 41 U/L   ALT 136 (H) 0 - 44 U/L   Alkaline Phosphatase 64 38 - 126 U/L   Total Bilirubin 1.5 (H) 0.3 - 1.2 mg/dL   GFR, Estimated >60 >60 mL/min    Comment: (NOTE) Calculated using the CKD-EPI Creatinine Equation (2021)    Anion gap 11 5 - 15    Comment: Performed at [facility] [address], [place], [place] [address]  C-reactive protein     Status: Abnormal   Collection Time: 11/11/21  3:50 AM  Result Value Ref Range   CRP 16.9 (H) <1.0 mg/dL    Comment: Performed at [facility] [address], [place], [place] [address]  D-dimer, quantitative     Status: Abnormal   Collection Time: 11/11/21  3:50 AM  Result Value Ref Range   D-Dimer, Quant 2.22 (H) 0.00 - 0.50 ug/mL-FEU    Comment: (NOTE) At the manufacturer cut-off value of 0.5 g/mL FEU, this assay has a negative predictive value of 95-100%.This assay is intended for use in conjunction with a clinical pretest probability (PTP) assessment model to exclude pulmonary embolism (PE) and deep venous thrombosis (DVT) in outpatients suspected of PE or DVT. Results should be correlated with clinical presentation. Performed at [facility], [place] [address], [place], [place] [address]   Magnesium     Status: Abnormal   Collection Time: 11/11/21  3:50 AM  Result Value Ref Range   Magnesium 1.1 (L) 1.7 - 2.4 mg/dL    Comment: Performed at [facility] [address], [place], [place] [address]  Phosphorus     Status: None   Collection Time: 11/11/21  3:50 AM  Result Value Ref Range   Phosphorus 2.7 2.5 - 4.6 mg/dL    Comment: Performed at [facility] [address] [place], [place] [address]  Lipase, blood     Status: None   Collection  Time: 11/11/21  3:50 AM  Result Value Ref Range   Lipase 46 11 - 51 U/L    Comment: Performed at [facility] [address] [place], [place] [address]  Glucose, capillary     Status: Abnormal   Collection Time: 11/11/21  7:59 AM  Result Value Ref Range   Glucose-Capillary 174 (H) 70 - 99 mg/dL    Comment: Glucose reference range applies only to samples taken after fasting for at least 8 hours.   DG Chest 2 View  Result Date: 11/10/2021 CLINICAL DATA:  Sepsis, shortness of breath and weakness. EXAM: CHEST - 2 VIEW COMPARISON:  05/19/2016 and CT chest 01/30/2015.  FINDINGS: Trachea is midline. Heart is enlarged, increased from 05/19/2016. No airspace consolidation or pleural fluid. Large hiatal hernia. IMPRESSION: 1. No acute findings. 2. Moderate cardiac enlargement, increased from 05/19/2016, despite differences in technique. 3. Large hiatal hernia. Electronically Signed   By: Lorin Picket M.D.   On: 11/10/2021 08:23   CT Angio Chest PE W and/or Wo Contrast  Result Date: 11/10/2021 CLINICAL DATA:  Acute respiratory failure with hypoxia. Pulmonary embolism (PE) suspected, high prob EXAM: CT ANGIOGRAPHY CHEST WITH CONTRAST TECHNIQUE: Multidetector CT imaging of the chest was performed using the standard protocol during bolus administration of intravenous contrast. Multiplanar CT image reconstructions and MIPs were obtained to evaluate the vascular anatomy. RADIATION DOSE REDUCTION: This exam was performed according to the departmental dose-optimization program which includes automated exposure control, adjustment of the mA and/or kV according to patient size and/or use of iterative reconstruction technique. CONTRAST:  176mL OMNIPAQUE IOHEXOL 350 MG/ML SOLN COMPARISON:  Chest radiograph from earlier today. FINDINGS: Cardiovascular: The study is moderate quality for the evaluation of pulmonary embolism, with some motion degradation. There are no filling defects in the central, lobar, segmental  or subsegmental pulmonary artery branches to suggest acute pulmonary embolism. Atherosclerotic nonaneurysmal thoracic aorta. Normal caliber pulmonary arteries. Mild cardiomegaly. No significant pericardial fluid/thickening. Mediastinum/Nodes: No discrete thyroid nodules. Unremarkable esophagus. No pathologically enlarged axillary, mediastinal or hilar lymph nodes. Lungs/Pleura: No pneumothorax. No pleural effusion. Mild hypoventilatory changes in the dependent lungs bilaterally. No acute consolidative airspace disease, lung masses or significant pulmonary nodules. Upper abdomen: Large hiatal hernia. Stomach is nondistended. Cholelithiasis. Musculoskeletal: No aggressive appearing focal osseous lesions. Mild thoracic spondylosis. Review of the MIP images confirms the above findings. IMPRESSION: 1. No evidence of acute pulmonary embolism. 2. Mild hypoventilatory changes in the dependent lungs bilaterally. Otherwise no active pulmonary disease. 3. Mild cardiomegaly. 4. Large hiatal hernia. 5. Cholelithiasis. 6. Aortic Atherosclerosis (ICD10-I70.0). Electronically Signed   By: Ilona Sorrel M.D.   On: 11/10/2021 16:45   MR ABDOMEN MRCP W WO CONTAST  Result Date: 11/10/2021 CLINICAL DATA:  Inpatient. Cholelithiasis, gallbladder wall thickening and dilated common bile duct. Acute respiratory failure with hypoxia. Abnormal liver function tests. EXAM: MRI ABDOMEN WITHOUT AND WITH CONTRAST (INCLUDING MRCP) TECHNIQUE: Multiplanar multisequence MR imaging of the abdomen was performed both before and after the administration of intravenous contrast. Heavily T2-weighted images of the biliary and pancreatic ducts were obtained, and three-dimensional MRCP images were rendered by post processing. CONTRAST:  9mL GADAVIST GADOBUTROL 1 MMOL/ML IV SOLN COMPARISON:  11/10/2021 abdominal sonogram and chest CT angiogram. FINDINGS: Lower chest: Compressive atelectasis from the large hiatal hernia at the medial lung bases.  Hepatobiliary: Normal liver size and configuration. No significant hepatic steatosis. No liver mass. Right ring 8 mm gallstone in the gallbladder with borderline mild gallbladder distension and borderline mild diffuse gallbladder wall thickening. No pericholecystic fluid. No biliary ductal dilatation. Common bile duct diameter 6 mm. No compelling evidence of choledocholithiasis on the motion degraded MRCP sequence. No biliary masses, strictures or beading. 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 Pancreas: No pancreatic mass or duct dilation.  No pancreas divisum. Spleen: Normal size. No mass. Adrenals/Urinary Tract: Normal adrenals. No hydronephrosis. Subcentimeter simple posterior upper right renal cortical cyst. No suspicious renal masses. Stomach/Bowel: Large hiatal hernia. Stomach is nondistended and otherwise normal. Visualized small and large bowel is normal caliber, with no bowel wall thickening. Vascular/Lymphatic: Normal caliber abdominal aorta. Patent portal, splenic, hepatic and renal veins. No pathologically enlarged lymph nodes in the abdomen.  Other: No abdominal ascites or focal fluid collection. Musculoskeletal: No aggressive appearing focal osseous lesions. IMPRESSION: 1. Cholelithiasis. Borderline mild gallbladder distension and borderline mild diffuse gallbladder wall thickening. No pericholecystic fluid. Findings are equivocal for acute cholecystitis. Hepatobiliary scintigraphy study may be obtained as clinically warranted. 2. No biliary ductal dilatation. CBD diameter 6 mm. No compelling evidence of choledocholithiasis on the motion degraded MRCP sequence. 3. 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 4. Large hiatal hernia. Electronically Signed   By: Ilona Sorrel M.D.   On: 11/10/2021 20:56   US Abdomen Limited RUQ (LIVER/GB)  Result Date: 11/10/2021 CLINICAL DATA:  Abnormal liver function tests EXAM: ULTRASOUND ABDOMEN LIMITED RIGHT UPPER QUADRANT COMPARISON:   01/28/2015 FINDINGS: Gallbladder: There is 11 mm hyperechoic focus in the dependent portion of gallbladder neck. There is no demonstrable acoustic shadowing. There is no demonstrable intraluminal mobility in the submitted images. There is wall thickening in gallbladder measuring up to 4.7 mm. There is no fluid around the gallbladder. Technologist did not identify definite focal tenderness. Common bile duct: Diameter: 9 mm. Distal common bile duct is not adequately visualized. There is no dilation of intrahepatic bile ducts. Liver: There is increased echogenicity. No focal abnormality is seen in the visualized portions of liver. Portal vein is patent on color Doppler imaging with normal direction of blood flow towards the liver. Other: None. IMPRESSION: There is 11 mm echogenic focus in the dependent portion of neck of the gallbladder suggesting possible gallbladder stone or sludge. There is no definite demonstrable intraluminal mobility of this echogenic structure. There is wall thickening in gallbladder, possibly suggesting chronic cholecystitis. There are no definite sonographic signs of acute cholecystitis. There is prominence of proximal common bile duct measuring 9 mm. Please correlate with laboratory findings to evaluate for possible obstruction of distal common bile duct. Increased echogenicity liver suggests fatty infiltration. Electronically Signed   By: Elmer Picker M.D.   On: 11/10/2021 12:38      Assessment/Plan Cholelithiasis, transaminitis The patient has been seen and examined as well as his chart reviewed and care everywhere.  MRCP/US findings noted.  His LFTs are currently down trending.  The patient has no clinical symptoms of physical exam findings concerning for cholecystitis.  His abdominal exam is completely benign.  It is unclear if his LFT elevation is secondary to passing a stone, or secondary to his COVID/medication/illness.  Given he has been eating well with no symptoms and  he is otherwise asymptomatic on exam, no further work up such as a HIDA scan is warranted at this time.  We would recommend continue medical treatment for his COVID.  We recommend the patient can follow up with [place] in the office in 1 month to re-evaluate him and make sure he is not having any biliary symptoms in the interim.  From a surgical standpoint, he may have a diet as tolerates.  No further surgical work up or evaluation warranted at this time.  We will sign off.   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

## 2021-11-11 NOTE — Plan of Care (Signed)
°  Problem: Education: Goal: Knowledge of risk factors and measures for prevention of condition will improve Outcome: Progressing   Problem: Respiratory: Goal: Will maintain a patent airway Outcome: Progressing   Problem: Coping: Goal: Psychosocial and spiritual needs will be supported Outcome: Progressing

## 2021-11-11 NOTE — Assessment & Plan Note (Addendum)
-   NIDDM, on Tradjenta and metformin outpatient -Hemoglobin A1c 7.3. -Patient was placed on insulin while inpatient.  He is on prednisone taper and CBGs are expected to improve. Patient recommended to continue glipizide 2.5 mg daily while on prednisone.  He checks his CBGs daily in a.m. and with meals.

## 2021-11-11 NOTE — Assessment & Plan Note (Addendum)
-   No acute abdominal pain, has transaminitis.  Abd US showed possible obstruction and thickening of gallbladder wall to suggest chronic cholecystitis -MRCP showed cholelithiasis, mild gallbladder distention and borderline mild diffuse gallbladder wall thickening, findings equivocal for acute cholecystitis.  No biliary duct dilatation - No plan for surgical intervention at this time. -Outpatient follow-up with general surgery

## 2021-11-11 NOTE — Assessment & Plan Note (Addendum)
-   Presented with hypoxia, O2 sats 88% on room air, leukocytosis, shortness of breath, nonproductive cough, fevers.  Patient is vaccinated and had boosters for COVID.  Chest x-ray showed moderate cardiac enlargement, increased from previous x-rays, large hiatal hernia.  UA negative for UTI. -Tested COVID-19 positive on 12/25, has completed 5-day course of Paxlovid outpatient. -Received IV Lasix 40 mg on 1/14 and 1/15.  Now off O2, lung sounds clear.  Resume outpatient oral Lasix 20 mg daily -Transitioned to oral prednisone with a taper upon discharge

## 2021-11-11 NOTE — Assessment & Plan Note (Addendum)
-   Likely due to possible rebound COVID-19 symptoms, improving -Improved, tolerating diet, continue supportive treatment

## 2021-11-11 NOTE — Progress Notes (Signed)
Placed patient on CPAP for the night via auto-mode with oxygen set at 3lpm.   

## 2021-11-11 NOTE — Progress Notes (Incomplete)
{  Select Note:[id number]} 

## 2021-11-11 NOTE — Consult Note (Signed)
Referring Provider: [facility] PCP: Pcp, No  Gastroenterologist: Dr. Jonathon Mueller Reason for consultation:    abnormal liver chemistries.                ASSESSMENT / PLAN   #   Abnormal liver chemistries , mixed pattern but mainly hepatocellular. He has cholelithiasis. MRCP was equivocal for acute cholecystitis but no concern for CBD obstruction in the motion degraded study. General Surgery has evaluated.    Of note his LFTs have historically been normal in Care Everywhere.  Suspect liver test abnormalities related to drug induced liver injury related to Paxlovid ( completed 5 days course last week). Additionally COVID19 possibly contributing to liver test abnormalities. In absence of abdominal pain and unremarkable abdominal exam acute cholecystitis seems unlikely but will defer to [facility].  --Liver enzymes and bilirubin improved overnight ( probably due in part to IVF hydration). Will check am LFTs.  --Unsure why INR elevated. No concerns for ALF. Mentation is normal.  --Avoid hepatoxic medications  # COVID19 / acute respiratory failure / sepsis. Completed course of Paxlovid last week. Febrile but improving. WBC count improved. On empiric antibiotics.   #  Chronic  Anthony Mueller anemia. Baseline Hgb mid 12 ( Care Everywhere). Hgb was down 1 gram below baseline on admission ( 11.7). Hgb 9.4 today after IVF. He is already established with GI - [place]   # Elevated INR, 2.22, elevated fibrinogen and D-dimer. CTA chest without evidence for PE  # Anal mass, benign. Plan is for surgical resection by Colorectal Surgeon Dr. Drue Mueller.   # Additional medical history listed below.   HISTORY OF PRESENT ILLNESS                                                                                                                         Chief Complaint:  none from patient.   Anthony Mueller is a 71 y.o. male with a past medical history significant for HTN, HLD, DM2, GERD, large hiatal hernia, IDA,  duodenal diverticulum, pancreatic divisum,colon polyps,  diverticulosis, cholelithiasis, OSA on CPAP,  cerebellar degeneration, wheelchair boundSee PMH for any additional medical problems.    ED course:  Patient presented to ED yesterday with positive home COVID test, nasal congestion, headaches and fever. Sp 02 88% with home pulse oximeter. Lactic acid elevated.  SARS positive. AST 192, ALT 208, Tbili 3.9, normal alk phos WBC 14.6, hgb 11.7. Ferritin 60. Troponin 40 , CRP 12.6. UA not suspicious. CXR >> large HH. Gives IVF, IV antibiotics. Admitted with sepsis / respiratory failure with hypoxia from COVID19.    RUQ [place] There is 11 mm echogenic focus in the dependent portion of neck of the gallbladder suggesting possible gallbladder stone or sludge. There is no definite demonstrable intraluminal mobility of this echogenic structure. There is wall thickening in gallbladder, possibly suggesting chronic cholecystitis. There are no definite sonographic signs of acute cholecystitis. There is prominence of proximal common bile duct measuring 9 mm. Please correlate  with laboratory findings to evaluate for possible obstruction    MRCP  IMPRESSION: 1. Cholelithiasis. Borderline mild gallbladder distension and borderline mild diffuse gallbladder wall thickening. No pericholecystic fluid. Findings are equivocal for acute cholecystitis. Hepatobiliary scintigraphy study may be obtained as clinically warranted. 2. No biliary ductal dilatation. CBD diameter 6 mm. No compelling evidence of choledocholithiasis on the motion degraded MRCP sequence. 3. 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 4. Large hiatal hernia.    Today's labs:  Alk phos still normal lipase 46 AST 102 >>> 79 ALT 208 >> 136 T bili 3.9 >> 1.5. CRP 12.5 >> 16.9 Lactic acid 3.2 >> 1.3  WBC 14.5 >> 13  Mr. Anthony Mueller took [employer] through Sunday. He felt better but then quickly relapsed with worsening headache  / congestion /  fever. He has been found to have elevated liver tests. Other than Paxlovid and a couple of doses of Tylenol he hasn't started any new medications. He says that he hasn't had ANY abdominal pain. He denies N/V t(though it was reported on admission apparently) .    EKG EKG Interpretation   Date/Time:                  Wednesday November 10 2021 07:54:00 EST Ventricular Rate:         96 PR Interval:                 300 QRS Duration: 90 QT Interval:                 334 QTC Calculation:        421 R Axis:                         64  Text Interpretation:      Undetermined rhythm ST & T wave abnormality, consider inferolateral ischemia Abnormal ECG Poor data quality Confirmed by Anthony Mueller [phone]) on 11/10/2021 8:58:13 AM   IMAGING:  DG Chest 2 View  Result Date: 11/10/2021 CLINICAL DATA:  Sepsis, shortness of breath and weakness. EXAM: CHEST - 2 VIEW COMPARISON:  05/19/2016 and CT chest 01/30/2015. FINDINGS: Trachea is midline. Heart is enlarged, increased from 05/19/2016. No airspace consolidation or pleural fluid. Large hiatal hernia. IMPRESSION: 1. No acute findings. 2. Moderate cardiac enlargement, increased from 05/19/2016, despite differences in technique. 3. Large hiatal hernia. Electronically Signed   By: Anthony Mueller M.D.   On: 11/10/2021 08:23   CT Angio Chest PE W and/or Wo Contrast  Result Date: 11/10/2021 CLINICAL DATA:  Acute respiratory failure with hypoxia. Pulmonary embolism (PE) suspected, high prob EXAM: CT ANGIOGRAPHY CHEST WITH CONTRAST TECHNIQUE: Multidetector CT imaging of the chest was performed using the standard protocol during bolus administration of intravenous contrast. Multiplanar CT image reconstructions and MIPs were obtained to evaluate the vascular anatomy. RADIATION DOSE REDUCTION: This exam was performed according to the departmental dose-optimization program which includes automated exposure control, adjustment of the mA and/or kV according to patient size and/or  use of iterative reconstruction technique. CONTRAST:  122m OMNIPAQUE IOHEXOL 350 MG/ML SOLN COMPARISON:  Chest radiograph from earlier today. FINDINGS: Cardiovascular: The study is moderate quality for the evaluation of pulmonary embolism, with some motion degradation. There are no filling defects in the central, lobar, segmental or subsegmental pulmonary artery branches to suggest acute pulmonary embolism. Atherosclerotic nonaneurysmal thoracic aorta. Normal caliber pulmonary arteries. Mild cardiomegaly. No significant pericardial fluid/thickening. Mediastinum/Nodes: No discrete thyroid nodules. Unremarkable esophagus. No pathologically enlarged axillary,  mediastinal or hilar lymph nodes. Lungs/Pleura: No pneumothorax. No pleural effusion. Mild hypoventilatory changes in the dependent lungs bilaterally. No acute consolidative airspace disease, lung masses or significant pulmonary nodules. Upper abdomen: Large hiatal hernia. Stomach is nondistended. Cholelithiasis. Musculoskeletal: No aggressive appearing focal osseous lesions. Mild thoracic spondylosis. Review of the MIP images confirms the above findings. IMPRESSION: 1. No evidence of acute pulmonary embolism. 2. Mild hypoventilatory changes in the dependent lungs bilaterally. Otherwise no active pulmonary disease. 3. Mild cardiomegaly. 4. Large hiatal hernia. 5. Cholelithiasis. 6. Aortic Atherosclerosis (ICD10-I70.0). Electronically Signed   By: Ilona Sorrel M.D.   On: 11/10/2021 16:45   MR ABDOMEN MRCP W WO CONTAST  Result Date: 11/10/2021 CLINICAL DATA:  Inpatient. Cholelithiasis, gallbladder wall thickening and dilated common bile duct. Acute respiratory failure with hypoxia. Abnormal liver function tests. EXAM: MRI ABDOMEN WITHOUT AND WITH CONTRAST (INCLUDING MRCP) TECHNIQUE: Multiplanar multisequence MR imaging of the abdomen was performed both before and after the administration of intravenous contrast. Heavily T2-weighted images of the biliary and  pancreatic ducts were obtained, and three-dimensional MRCP images were rendered by post processing. CONTRAST:  33m GADAVIST GADOBUTROL 1 MMOL/ML IV SOLN COMPARISON:  11/10/2021 abdominal sonogram and chest CT angiogram. FINDINGS: Lower chest: Compressive atelectasis from the large hiatal hernia at the medial lung bases. Hepatobiliary: Normal liver size and configuration. No significant hepatic steatosis. No liver mass. Right ring 8 mm gallstone in the gallbladder with borderline mild gallbladder distension and borderline mild diffuse gallbladder wall thickening. No pericholecystic fluid. No biliary ductal dilatation. Common bile duct diameter 6 mm. No compelling evidence of choledocholithiasis on the motion degraded MRCP sequence. No biliary masses, strictures or beading. 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 Pancreas: No pancreatic mass or duct dilation.  No pancreas divisum. Spleen: Normal size. No mass. Adrenals/Urinary Tract: Normal adrenals. No hydronephrosis. Subcentimeter simple posterior upper right renal cortical cyst. No suspicious renal masses. Stomach/Bowel: Large hiatal hernia. Stomach is nondistended and otherwise normal. Visualized small and large bowel is normal caliber, with no bowel wall thickening. Vascular/Lymphatic: Normal caliber abdominal aorta. Patent portal, splenic, hepatic and renal veins. No pathologically enlarged lymph nodes in the abdomen. Other: No abdominal ascites or focal fluid collection. Musculoskeletal: No aggressive appearing focal osseous lesions. IMPRESSION: 1. Cholelithiasis. Borderline mild gallbladder distension and borderline mild diffuse gallbladder wall thickening. No pericholecystic fluid. Findings are equivocal for acute cholecystitis. Hepatobiliary scintigraphy study may be obtained as clinically warranted. 2. No biliary ductal dilatation. CBD diameter 6 mm. No compelling evidence of choledocholithiasis on the motion degraded MRCP sequence. 3.  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 4. Large hiatal hernia. Electronically Signed   By: JIlona SorrelM.D.   On: 11/10/2021 20:56   [place] Limited RUQ (LIVER/GB)  Result Date: 11/10/2021 CLINICAL DATA:  Abnormal liver function tests EXAM: ULTRASOUND ABDOMEN LIMITED RIGHT UPPER QUADRANT COMPARISON:  01/28/2015 FINDINGS: Gallbladder: There is 11 mm hyperechoic focus in the dependent portion of gallbladder neck. There is no demonstrable acoustic shadowing. There is no demonstrable intraluminal mobility in the submitted images. There is wall thickening in gallbladder measuring up to 4.7 mm. There is no fluid around the gallbladder. Technologist did not identify definite focal tenderness. Common bile duct: Diameter: 9 mm. Distal common bile duct is not adequately visualized. There is no dilation of intrahepatic bile ducts. Liver: There is increased echogenicity. No focal abnormality is seen in the visualized portions of liver. Portal vein is patent on color Doppler imaging with normal direction of blood flow towards the  liver. Other: None. IMPRESSION: There is 11 mm echogenic focus in the dependent portion of neck of the gallbladder suggesting possible gallbladder stone or sludge. There is no definite demonstrable intraluminal mobility of this echogenic structure. There is wall thickening in gallbladder, possibly suggesting chronic cholecystitis. There are no definite sonographic signs of acute cholecystitis. There is prominence of proximal common bile duct measuring 9 mm. Please correlate with laboratory findings to evaluate for possible obstruction of distal common bile duct. Increased echogenicity liver suggests fatty infiltration. Electronically Signed   By: Elmer Picker M.D.   On: 11/10/2021 12:38       [place] Aug 2022 Colonoscopy - [facility]  --Findings Two sessile polyps measuring from 5 mm up to 8 mm in the ascending colon  and sigmoid  colon; completely removed en bloc by cold snare and retrieved  specimen  Moderate left sided diverticulosis was noted.  Medium Internal hemorrhoids were seen on rectal retroflection.  A 3-4 cm soft mass like lesion was seen at the anal opening and seemed to  be originating in the anal canal.  [facility] Sept 2022 EUS:  Findings  On rectal exam there is a large pink nodular soft but friable mass  emanating from the anus.  I was unable to reduce this into the rectum.    The endoscope was introduced into the rectum and retroflexed views no  masses seen.  The endoscope was then slowly withdrawn through the anal  canal.  It was clear then that the base of the lesion was proximal to (on  the colon side) of the dentate line.  On rectal ultrasound the lesion did  not appear to be invasive but contained very thickened layers of mucosa  and submucosa.  The lesion was not biopsied as it was completely external.   Otherwise the rectum and distal sigmoid are normal.    Impression  Rectal prolapse   Recommendation  Follow-up with referring     Past Medical History:  Diagnosis Date   Cerebellar degeneration ([place])    Diabetes mellitus without complication ([place])    Hyperlipidemia    Hypertension     History reviewed. No pertinent surgical history.  Prior to Admission medications   Medication Sig Start Date End Date Taking? Authorizing Provider  allopurinol (ZYLOPRIM) 100 MG tablet Take 100 mg by mouth in the morning.   Yes [provider]  allopurinol (ZYLOPRIM) 300 MG tablet Take 300 mg by mouth every evening. 10/29/21  Yes [provider]  aspirin EC 81 MG tablet Take 81 mg by mouth every evening. Swallow whole.   Yes [provider]  candesartan (ATACAND) 32 MG tablet Take 32 mg by mouth daily. 09/13/21  Yes [provider]  carvedilol (COREG) 25 MG tablet Take 25 mg by mouth 2 (two) times daily. 10/19/21  Yes [provider]  cloNIDine  (CATAPRES) 0.2 MG tablet Take 0.2 mg by mouth 2 (two) times daily. 10/28/21  Yes [provider]  felodipine (PLENDIL) 10 MG 24 hr tablet Take 10 mg by mouth daily. 10/28/21  Yes [provider]  Ferrous Sulfate (IRON SLOW RELEASE) 143 (45 Fe) MG TBCR Take 143 mg by mouth every evening.   Yes [provider]  furosemide (LASIX) 20 MG tablet Take 20 mg by mouth daily. 08/30/21  Yes [provider]  lovastatin (MEVACOR) 40 MG tablet Take 40 mg by mouth every evening. 09/20/21  Yes [provider]  metFORMIN (GLUCOPHAGE) 500 MG tablet Take 1,000 mg by mouth 2 (two) times daily. 08/17/21  Yes [provider]  omeprazole (PRILOSEC) 20 MG capsule Take 20 mg by mouth daily. 11/04/21  Yes [provider]  tamsulosin (FLOMAX) 0.4 MG CAPS capsule Take 0.4 mg by mouth daily. 08/30/21  Yes [provider]  timolol (TIMOPTIC) 0.5 % ophthalmic solution Place 1 drop into both eyes daily. 09/13/21  Yes [provider]  TRADJENTA 5 MG TABS tablet Take 5 mg by mouth daily. 09/20/21  Yes [provider]    Current Facility-Administered Medications  Medication Dose Route Frequency Provider Last Rate Last Admin   albuterol (VENTOLIN HFA) 108 (90 Base) MCG/ACT inhaler 2 puff  2 puff Inhalation Q6H Smith, Rondell A, MD   2 puff at 11/10/21 1309   allopurinol (ZYLOPRIM) tablet 100 mg  100 mg Oral q AM Fuller Plan A, MD       ascorbic acid (VITAMIN C) tablet 500 mg  500 mg Oral Daily Tamala Julian, Rondell A, MD   500 mg at 11/10/21 1306   aspirin EC tablet 81 mg  81 mg Oral QPM Smith, Rondell A, MD   81 mg at 11/10/21 1850   azithromycin (ZITHROMAX) 500 mg in sodium chloride 0.9 % 250 mL IVPB  500 mg Intravenous Q24H Anthony Rank, MD   Stopped at 11/10/21 1139   carvedilol (COREG) tablet 25 mg  25 mg Oral BID Fuller Plan A, MD   25 mg at 11/10/21 2223   cefTRIAXone (ROCEPHIN) 2 g in sodium chloride 0.9 % 100 mL IVPB  2 g Intravenous Q24H  Anthony Rank, MD   Stopped at 11/10/21 1139   cloNIDine (CATAPRES) tablet 0.2 mg  0.2 mg Oral BID Fuller Plan A, MD   0.2 mg at 11/10/21 2223   dexamethasone (DECADRON) tablet 6 mg  6 mg Oral Daily Fuller Plan A, MD   6 mg at 11/10/21 1307   enoxaparin (LOVENOX) injection 40 mg  40 mg Subcutaneous Daily Fuller Plan A, MD   40 mg at 11/10/21 1305   felodipine (PLENDIL) 24 hr tablet 10 mg  10 mg Oral Daily Smith, Rondell A, MD       furosemide (LASIX) tablet 20 mg  20 mg Oral Daily Smith, Rondell A, MD       guaiFENesin-dextromethorphan (ROBITUSSIN DM) 100-10 MG/5ML syrup 10 mL  10 mL Oral Q4H PRN Smith, Rondell A, MD       insulin aspart (novoLOG) injection 0-5 Units  0-5 Units Subcutaneous QHS Smith, Rondell A, MD       insulin aspart (novoLOG) injection 0-9 Units  0-9 Units Subcutaneous TID WC Smith, Rondell A, MD   2 Units at 11/10/21 1255   irbesartan (AVAPRO) tablet 300 mg  300 mg Oral Daily Smith, Rondell A, MD       linagliptin (TRADJENTA) tablet 5 mg  5 mg Oral Daily Smith, Rondell A, MD   5 mg at 11/10/21 1307   magnesium sulfate IVPB 2 g 50 mL  2 g Intravenous Once Fuller Plan A, MD       ondansetron (ZOFRAN) tablet 4 mg  4 mg Oral Q6H PRN Fuller Plan A, MD       Or   ondansetron (ZOFRAN) injection 4 mg  4 mg Intravenous Q6H PRN Fuller Plan A, MD   4 mg at 11/10/21 1535   pantoprazole (PROTONIX) EC tablet 40 mg  40 mg Oral Daily Norval Morton, MD  40 mg at 11/10/21 1306   sodium chloride flush (NS) 0.9 % injection 3 mL  3 mL Intravenous Q12H Smith, Rondell A, MD   3 mL at 11/10/21 1154   tamsulosin (FLOMAX) capsule 0.4 mg  0.4 mg Oral Daily Smith, Rondell A, MD   0.4 mg at 11/10/21 1306   timolol (TIMOPTIC) 0.5 % ophthalmic solution 1 drop  1 drop Both Eyes Daily Tamala Julian, Rondell A, MD   1 drop at 11/10/21 1308   zinc sulfate capsule 220 mg  220 mg Oral Daily Tamala Julian, Rondell A, MD   220 mg at 11/10/21 1308    Allergies as of 11/10/2021 - Review Complete 11/10/2021   Allergen Reaction Noted   Liraglutide  05/26/2015   Hydrocodone  08/05/2015   Hydrocodone-acetaminophen  08/28/2012   Other  08/05/2015   Tape Other (See Comments) 09/03/2011   Latex Rash 05/01/2012   Metronidazole Nausea And Vomiting 08/28/2012    History reviewed. No pertinent family history.  Social History   Socioeconomic History   Marital status: Married    Spouse name: Not on file   Number of children: Not on file   Years of education: Not on file   Highest education level: Not on file  Occupational History   Not on file  Tobacco Use   Smoking status: Never   Smokeless tobacco: Never  Substance and Sexual Activity   Alcohol use: Not Currently   Drug use: Never   Sexual activity: Not on file  Other Topics Concern   Not on file  Social History Narrative   Not on file   Social Determinants of Health   Financial Resource Strain: Not on file  Food Insecurity: Not on file  Transportation Needs: Not on file  Physical Activity: Not on file  Stress: Not on file  Social Connections: Not on file  Intimate Partner Violence: Not on file    Review of Systems: Positive for fatigue, shortness of breath, cough, congestion. All systems reviewed and negative except where noted in HPI.   OBJECTIVE    Physical Exam: Vital signs in last 24 hours: Temp:  [97.6 F (36.4 C)-98.4 F (36.9 C)] 97.6 F (36.4 C) (01/12 0800) Pulse Rate:  [72-97] 72 (01/12 0800) Resp:  [11-25] 18 (01/12 0800) BP: (124-154)/(74-114) 137/81 (01/12 0800) SpO2:  [90 %-96 %] 94 % (01/12 0800)    General:  Alert well developed male in NAD Psych:  Pleasant, cooperative. Normal mood and affect Eyes: Pupils equal, no icterus. Conjunctive pink Ears:  Normal auditory acuity Nose: No deformity, discharge or lesions Neck:  Supple, no masses felt Lungs:  Clear to auscultation.  Heart:  Regular rate, regular rhythm. No lower extremity edema Abdomen:  Soft, nondistended, nontender, active bowel  sounds, no masses felt Rectal :  Deferred Msk: Symmetrical without gross deformities.  Neurologic:  Alert, oriented, grossly normal neurologically Skin:  Intact without significant lesions.    Scheduled inpatient medications  albuterol  2 puff Inhalation Q6H   allopurinol  100 mg Oral q AM   vitamin C  500 mg Oral Daily   aspirin EC  81 mg Oral QPM   carvedilol  25 mg Oral BID   cloNIDine  0.2 mg Oral BID   dexamethasone  6 mg Oral Daily   enoxaparin (LOVENOX) injection  40 mg Subcutaneous Daily   felodipine  10 mg Oral Daily   furosemide  20 mg Oral Daily   insulin aspart  0-5 Units Subcutaneous QHS  insulin aspart  0-9 Units Subcutaneous TID WC   irbesartan  300 mg Oral Daily   linagliptin  5 mg Oral Daily   pantoprazole  40 mg Oral Daily   sodium chloride flush  3 mL Intravenous Q12H   tamsulosin  0.4 mg Oral Daily   timolol  1 drop Both Eyes Daily   zinc sulfate  220 mg Oral Daily      Intake/Output from previous day: 01/11 0701 - 01/12 0700 In: 243 [P.O.:240; I.V.:3] Out: 300 [Urine:300] Intake/Output this shift: No intake/output data recorded.   Lab Results: Recent Labs    11/10/21 0806 11/11/21 0350  WBC 14.6* 13.0*  HGB 11.7* 9.4*  HCT 36.3* 28.1*  PLT 217 171   BMET Recent Labs    11/10/21 0806 11/11/21 0350  NA 139 138  K 3.3* 3.7  CL 96* 98  CO2 28 29  GLUCOSE 191* 222*  BUN 12 14  CREATININE 1.15 1.18  CALCIUM 8.4* 7.8*   LFTs Recent Labs    11/11/21 0350  PROT 6.0*  ALBUMIN 2.6*  AST 79*  ALT 136*  ALKPHOS 64  BILITOT 1.5*   PT/INR Recent Labs    11/10/21 0806  LABPROT 13.4  INR 1.0   Hepatitis Panel Recent Labs    11/10/21 1214  HEPBSAG NON REACTIVE     . CBC Latest Ref Rng & Units 11/11/2021 11/10/2021  WBC 4.0 - 10.5 K/uL 13.0(H) 14.6(H)  Hemoglobin 13.0 - 17.0 g/dL 9.4(L) 11.7(L)  Hematocrit 39.0 - 52.0 % 28.1(L) 36.3(L)  Platelets 150 - 400 K/uL 171 217    . CMP Latest Ref Rng & Units 11/11/2021  11/10/2021  Glucose 70 - 99 mg/dL 222(H) 191(H)  BUN 8 - 23 mg/dL 14 12  Creatinine 0.61 - 1.24 mg/dL 1.18 1.15  Sodium 135 - 145 mmol/L 138 139  Potassium 3.5 - 5.1 mmol/L 3.7 3.3(L)  Chloride 98 - 111 mmol/L 98 96(L)  CO2 22 - 32 mmol/L 29 28  Calcium 8.9 - 10.3 mg/dL 7.8(L) 8.4(L)  Total Protein 6.5 - 8.1 g/dL 6.0(L) 7.4  Total Bilirubin 0.3 - 1.2 mg/dL 1.5(H) 3.9(H)  Alkaline Phos 38 - 126 U/L 64 86  AST 15 - 41 U/L 79(H) 192(H)  ALT 0 - 44 U/L 136(H) 208(H)     Principal Problem:   Acute respiratory failure with hypoxia ([facility]) Active Problems:   Cardiomegaly   Cerebellar degeneration ([facility])   Type 2 diabetes mellitus with hyperlipidemia ([facility])   Hypokalemia   Nausea vomiting and diarrhea   Cholelithiasis   COVID-19 virus infection   Transaminitis   GERD (gastroesophageal reflux disease)    Tye Savoy, NP-C @  11/11/2021, 10:23 AM

## 2021-11-11 NOTE — Progress Notes (Signed)
Lower extremity venous has been completed.   Preliminary results in CV Proc.   Anthony Mueller 11/11/2021 3:12 PM

## 2021-11-11 NOTE — Progress Notes (Signed)
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  Also reported nausea vomiting diarrhea, dizziness, generalized weakness.  Febrile at the time of admission 101.6.  Currently, pulse 95 200, BP 174/77.  Hypoxia with O2 sats 88% on room air. Patient had tested positive for COVID on 12/25 and had completed 5-day course of Paxlovid.  Also noted to have transaminitis, concern for cholecystitis on abdominal ultrasound and MRCP.  GI, general surgery consulted.    Assessment and Plan * Acute respiratory failure with hypoxia ([place])- (present on admission) - Presented with hypoxia, O2 sats 88% on room air, leukocytosis, shortness of breath, nonproductive cough, fevers.  Patient is vaccinated and had boosters for COVID.  Chest x-ray showed moderate cardiac enlargement, increased from previous x-rays, large hiatal hernia.  UA negative for UTI. -Tested COVID-19 positive on 12/25, has completed 5-day course of Paxlovid outpatient. -Wean O2 as tolerated, home O2 evaluation prior to discharge.  Transaminitis- (present on admission) - Possibly due to COVID-19 and paxlovid.  LFTs are now improving.  - Appreciate GI and general surgery recommendations  COVID-19 virus infection- (present on admission) - Patient tested positive with COVID-19 on 12/25, has completed 5-day course of Paxlovid.  -Due to hypoxia, will continue Decadron.  Procalcitonin 3.63.  Continue antibiotics. -CTA negative for acute PE.  Elevated D-dimer, has lower extremity edema, will check venous Dopplers to rule out any DVT.  Cholelithiasis- (present on admission) - No acute abdominal  pain, has transaminitis.  Abdominal ultrasound showed possible obstruction and thickening of gallbladder wall to suggest chronic cholecystitis -MRCP showed cholelithiasis, mild gallbladder distention and borderline mild diffuse gallbladder wall thickening, findings equivocal for acute cholecystitis.  No biliary duct dilatation -Appreciate GI and general surgery recommendations.  No plan for surgical intervention at this time. -Currently n.p.o., will await GI recommendations if no invasive intervention planned, will advance diet.  LFTs improving.  Nausea vomiting and diarrhea- (present on admission) - Likely due to possible rebound COVID-19 symptoms -Continue supportive treatment  Hypokalemia- (present on admission) - Replaced, now improved to 3.7  Type 2 diabetes mellitus with hyperlipidemia ([place])- (present on admission) - NIDDM, on Tradjenta and metformin outpatient -Follow CBGs closely, expected to rise with steroids.  Hemoglobin A1c 7.3. -Continue sliding scale insulin, added basal insulin 7 units daily Recent Labs    11/10/21 1228 11/10/21 2232 11/11/21 0759 11/11/21 1128  GLUCAP 181* 238* 174* 148*    Cardiomegaly- (present on admission) - Also noted to have elevated BNP, follow 2D echo  GERD (gastroesophageal reflux disease)- (present on admission) Continue PPI  Cerebellar degeneration ([facility]) - History of cerebellar degeneration, wheelchair-bound at baseline    Subjective: Still has productive cough, shortness of breath.  Currently no acute abdominal pain.  Afebrile this morning.  Still on 2 L O2  Objective BP 133/76 (BP Location: Left Arm)    Pulse 71    Temp 98.6 F (37 C) (Oral)    Resp 18    Ht 6\' 1"  (1.854 m)    Wt 113.4 kg    SpO2 92%    BMI 32.98 kg/m    Physical Exam General: Alert and oriented x 3, NAD Cardiovascular: S1 S2 clear, RRR.  No pedal edema b/l Respiratory: Mild expiratory wheezing bilaterally Gastrointestinal: Soft, nontender, nondistended,  NBS Ext: 1+ pedal edema bilaterally, Rt >Lt Neuro: no new deficits Skin: No rashes Psych: Normal affect and demeanor, alert and oriented x3    Data Reviewed: CMP Latest Ref Rng & Units 11/11/2021 11/10/2021  Glucose 70 - 99 mg/dL 222(H) 191(H)  BUN 8 - 23 mg/dL 14 12  Creatinine 0.61 - 1.24 mg/dL 1.18 1.15  Sodium 135 - 145 mmol/L 138 139  Potassium 3.5 - 5.1 mmol/L 3.7 3.3(L)  Chloride 98 - 111 mmol/L 98 96(L)  CO2 22 - 32 mmol/L 29 28  Calcium 8.9 - 10.3 mg/dL 7.8(L) 8.4(L)  Total Protein 6.5 - 8.1 g/dL 6.0(L) 7.4  Total Bilirubin 0.3 - 1.2 mg/dL 1.5(H) 3.9(H)  Alkaline Phos 38 - 126 U/L 64 86  AST 15 - 41 U/L 79(H) 192(H)  ALT 0 - 44 U/L 136(H) 208(H)    CBC Latest Ref Rng & Units 11/11/2021 11/10/2021  WBC 4.0 - 10.5 K/uL 13.0(H) 14.6(H)  Hemoglobin 13.0 - 17.0 g/dL 9.4(L) 11.7(L)  Hematocrit 39.0 - 52.0 % 28.1(L) 36.3(L)  Platelets 150 - 400 K/uL 171 217    Imaging reviewed independently.   Family Communication: Discussed with patient in detail. Called patient's wife on the phone [phone] however was unable to make contact.  Disposition: Status is: Inpatient  Remains inpatient appropriate because: Ongoing management for acute COVID-19, PNA, cardiomegaly.  DVT prophylaxis: Lovenox    Time spent: 35 minutes  Author: Estill Cotta, MD 11/11/2021 12:16 PM  For on call review [email or web address].

## 2021-11-11 NOTE — Plan of Care (Signed)
  Problem: Education: Goal: Knowledge of risk factors and measures for prevention of condition will improve Outcome: Progressing   Problem: Coping: Goal: Psychosocial and spiritual needs will be supported Outcome: Progressing   Problem: Respiratory: Goal: Will maintain a patent airway Outcome: Progressing   

## 2021-11-11 NOTE — Plan of Care (Signed)
°  Problem: Education: Goal: Knowledge of risk factors and measures for prevention of condition will improve Outcome: Progressing   Problem: Coping: Goal: Psychosocial and spiritual needs will be supported Outcome: Progressing   Problem: Respiratory: Goal: Will maintain a patent airway Outcome: Progressing Goal: Complications related to the disease process, condition or treatment will be avoided or minimized Outcome: Progressing   Problem: Education: Goal: Knowledge of General Education information will improve Description: Including pain rating scale, medication(s)/side effects and non-pharmacologic comfort measures Outcome: Progressing   Problem: Clinical Measurements: Goal: Ability to maintain clinical measurements within normal limits will improve Outcome: Progressing Goal: Will remain free from infection Outcome: Progressing Goal: Diagnostic test results will improve Outcome: Progressing Goal: Respiratory complications will improve Outcome: Progressing Goal: Cardiovascular complication will be avoided Outcome: Progressing   Problem: Activity: Goal: Risk for activity intolerance will decrease Outcome: Progressing

## 2021-11-11 NOTE — Evaluation (Signed)
Physical Therapy Evaluation Patient Details Name: Anthony Mueller MRN: [id number] DOB: Dec 27, 1950 Today's Date: 11/11/2021  History of Present Illness  71 yo male presents to [facility] on 1/11, covid + as of 12/25 with x5 day course of paxlovid but had resumption of symptoms 1/9. 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  Clinical Impression   Pt presents with generalized weakness, incoordination which is pt's baseline, min difficulty performing transfers, and decreased activity tolerance vs baseline. Pt to benefit from acute PT to address deficits. At baseline, pt is mod I for transfers in/out of wheelchair, propels wheelchair himself, and likes to be as independent as possible. Pt currently requiring light physical assist for into/out of wheelchair, anticipate pt will progress well with daily mobility while acute and with support of wife at home. Of note, pt has an IndeeLift to get him off the floor if he falls, does NOT have a hoyer lift for transfers and is independent with squat pivot transfers at baseline. PT to progress mobility as tolerated, and will continue to follow acutely.      SpO2 maintained >90% on RA for a majority of session, x1 dip to 82% but recovered to 90s with rest. 1LO2 replaced at end of session.   Recommendations for follow up therapy are one component of a multi-disciplinary discharge planning process, led by the attending physician.  Recommendations may be updated based on patient status, additional functional criteria and insurance authorization.  Follow Up Recommendations Home health PT    Assistance Recommended at Discharge Frequent or constant Supervision/Assistance  Patient can return home with the following  A little help with walking and/or transfers;Assist for transportation;A little help with bathing/dressing/bathroom    Equipment Recommendations None recommended by PT   Recommendations for Other Services       Functional Status Assessment Patient has had a recent decline in their functional status and demonstrates the ability to make significant improvements in function in a reasonable and predictable amount of time.     Precautions / Restrictions Precautions Precautions: Fall Restrictions Weight Bearing Restrictions: No      Mobility  Bed Mobility Overal bed mobility: Needs Assistance Bed Mobility: Supine to Sit;Sit to Supine;Rolling Rolling: Min guard   Supine to sit: Min guard;HOB elevated Sit to supine: Min assist;HOB elevated   General bed mobility comments: close guard for rolling bilat for pad placement, light assist for transitioning LEs back into bed and repositioning. Pt with use of bedrails, uses bedposts at home    Transfers Overall transfer level: Needs assistance Equipment used: Rolling walker (2 wheels) Transfers: Sit to/from Stand;Bed to chair/wheelchair/BSC Sit to Stand: Min assist;From elevated surface     Squat pivot transfers: Min assist     General transfer comment: assist for steadying upon standing and safe pivot to destination surface bed<>wheelchair. Pt performs nearly 180* squat pivot to/from bed, has more difficulty going from wheelchair to bed.    Ambulation/Gait                  Stairs            Wheelchair Mobility    Modified Rankin (Stroke Patients Only)       Balance Overall balance assessment: Needs assistance;History of Falls ("slides to floor") Sitting-balance support: Feet supported;No upper extremity supported Sitting balance-Leahy Scale: Good Sitting balance - Comments: able to sit EOB and scoot bilat with good weight shifting   Standing balance support: Bilateral upper extremity  supported;During functional activity Standing balance-Leahy Scale: Poor Standing balance comment: reliant on UEs                             Pertinent Vitals/Pain Pain Assessment:  Faces Faces Pain Scale: No hurt Pain Intervention(s): Monitored during session    Home Living Family/patient expects to be discharged to:: Private residence Living Arrangements: Spouse/significant other Available Help at Discharge: Family;Available 24 hours/day Type of Home: House Home Access: Ramped entrance       Home Layout: One level Home Equipment: Wheelchair - manual;Other (comment);Grab bars - tub/shower;Rolling Environmental consultant (2 wheels);Wheelchair - power Additional Comments: IndeeLift to get him off the floor if he falls, does NOT have a hoyer lift for transfers and is independent with squat pivot transfers at baseline    Prior Function Prior Level of Function : Needs assist       Physical Assist : ADLs (physical);Mobility (physical) Mobility (physical): Transfers ADLs (physical): Bathing Mobility Comments: occasionally uses recumbant bike; transfers self from w/c to toilet, gets in and out of bed without assist of wife. ADLs Comments: has assist from wife for showering and transfer into shower (shower has a glass door, pt states he puts his hands on wife's shoulders to boost up, then grabs the frame of the shower door to complete transfer - may benefit from a safer transfer)     Hand Dominance   Dominant Hand: Right    Extremity/Trunk Assessment   Upper Extremity Assessment Upper Extremity Assessment: Defer to OT evaluation    Lower Extremity Assessment Lower Extremity Assessment: Generalized weakness (incoordination L>R)    Cervical / Trunk Assessment Cervical / Trunk Assessment: Normal  Communication   Communication: Other (comment) (some slurred speech secondary to cerebellar pathology)  Cognition Arousal/Alertness: Awake/alert Behavior During Therapy: WFL for tasks assessed/performed Overall Cognitive Status: Within Functional Limits for tasks assessed                                          General Comments General comments (skin  integrity, edema, etc.): SpO2 with dip to 82% on RA during return to supine, quickly rebounded to >90s% on RA but placed back on 1LO2 for pt safety    Exercises     Assessment/Plan    PT Assessment Patient needs continued PT services  PT Problem List Decreased strength;Decreased mobility;Decreased safety awareness;Decreased balance;Decreased knowledge of use of DME;Pain;Decreased activity tolerance;Decreased cognition;Decreased coordination       PT Treatment Interventions DME instruction;Therapeutic activities;Gait training;Therapeutic exercise;Patient/family education;Balance training;Stair training;Functional mobility training;Neuromuscular re-education    PT Goals (Current goals can be found in the Care Plan section)  Acute Rehab PT Goals Patient Stated Goal: home PT Goal Formulation: With patient/family Time For Goal Achievement: 11/25/21 Potential to Achieve Goals: Good    Frequency Min 3X/week     Co-evaluation               AM-PAC PT "6 Clicks" Mobility  Outcome Measure Help needed turning from your back to your side while in a flat bed without using bedrails?: A Little Help needed moving from lying on your back to sitting on the side of a flat bed without using bedrails?: A Little Help needed moving to and from a bed to a chair (including a wheelchair)?: A Little Help needed standing up from a chair using your arms (  e.g., wheelchair or bedside chair)?: A Little Help needed to walk in hospital room?: Total Help needed climbing 3-5 steps with a railing? : Total 6 Click Score: 14    End of Session Equipment Utilized During Treatment: Oxygen (O2 removed for session with spo2 >90% for most of session, except for one dip to 82%; reapplied at end of session) Activity Tolerance: Patient tolerated treatment well Patient left: in bed;with call bell/phone within reach;with bed alarm set Nurse Communication: Mobility status PT Visit Diagnosis: Other abnormalities of gait  and mobility (R26.89);Muscle weakness (generalized) (M62.81)    Time: 4098-1191 PT Time Calculation (min) (ACUTE ONLY): 42 min   Charges:   PT Evaluation $PT Eval Low Complexity: 1 Low PT Treatments $Therapeutic Activity: 23-37 mins       Stacie Glaze, PT DPT Acute Rehabilitation Services Pager [phone]  Office [phone]   Louis Matte 11/11/2021, 5:38 PM

## 2021-11-11 NOTE — Assessment & Plan Note (Addendum)
-   2D echo showed EF of 55 to 60%, no regional WMA -Resume Lasix 20 mg daily (outpatient dose)

## 2021-11-11 NOTE — Assessment & Plan Note (Addendum)
-   Possibly due to COVID-19 and paxlovid.  - LFTs improving - Appreciate GI and general surgery recommendations, outpatient follow-up recommended

## 2021-11-11 NOTE — Assessment & Plan Note (Addendum)
Resolved

## 2021-11-11 NOTE — Progress Notes (Signed)
Echocardiogram 2D Echocardiogram has been performed.  Anthony Mueller 11/11/2021, 10:00 AM

## 2021-11-11 NOTE — Assessment & Plan Note (Signed)
Continue PPI ?

## 2021-11-12 LAB — COMPREHENSIVE METABOLIC PANEL
ALT: 116 U/L — ABNORMAL HIGH (ref 0–44)
AST: 51 U/L — ABNORMAL HIGH (ref 15–41)
Albumin: 2.8 g/dL — ABNORMAL LOW (ref 3.5–5.0)
Alkaline Phosphatase: 69 U/L (ref 38–126)
Anion gap: 12 (ref 5–15)
BUN: 22 mg/dL (ref 8–23)
CO2: 29 mmol/L (ref 22–32)
Calcium: 8.1 mg/dL — ABNORMAL LOW (ref 8.9–10.3)
Chloride: 97 mmol/L — ABNORMAL LOW (ref 98–111)
Creatinine, Ser: 1.45 mg/dL — ABNORMAL HIGH (ref 0.61–1.24)
GFR, Estimated: 52 mL/min — ABNORMAL LOW (ref >60)
Glucose, Bld: 187 mg/dL — ABNORMAL HIGH (ref 70–99)
Potassium: 3.9 mmol/L (ref 3.5–5.1)
Sodium: 138 mmol/L (ref 135–145)
Total Bilirubin: 0.8 mg/dL (ref 0.3–1.2)
Total Protein: 6.6 g/dL (ref 6.5–8.1)

## 2021-11-12 LAB — GLUCOSE, CAPILLARY
Glucose-Capillary: 147 mg/dL — ABNORMAL HIGH (ref 70–99)
Glucose-Capillary: 161 mg/dL — ABNORMAL HIGH (ref 70–99)
Glucose-Capillary: 254 mg/dL — ABNORMAL HIGH (ref 70–99)
Glucose-Capillary: 259 mg/dL — ABNORMAL HIGH (ref 70–99)

## 2021-11-12 LAB — PHOSPHORUS: Phosphorus: 3.3 mg/dL (ref 2.5–4.6)

## 2021-11-12 LAB — D-DIMER, QUANTITATIVE: D-Dimer, Quant: 2.2 ug/mL-FEU — ABNORMAL HIGH (ref 0.00–0.50)

## 2021-11-12 LAB — CBC WITH DIFFERENTIAL/PLATELET
Abs Immature Granulocytes: 0.11 10*3/uL — ABNORMAL HIGH (ref 0.00–0.07)
Basophils Absolute: 0 10*3/uL (ref 0.0–0.1)
Basophils Relative: 0 %
Eosinophils Absolute: 0 10*3/uL (ref 0.0–0.5)
Eosinophils Relative: 0 %
HCT: 28.9 % — ABNORMAL LOW (ref 39.0–52.0)
Hemoglobin: 9.4 g/dL — ABNORMAL LOW (ref 13.0–17.0)
Immature Granulocytes: 1 %
Lymphocytes Relative: 7 %
Lymphs Abs: 1.2 10*3/uL (ref 0.7–4.0)
MCH: 29 pg (ref 26.0–34.0)
MCHC: 32.5 g/dL (ref 30.0–36.0)
MCV: 89.2 fL (ref 80.0–100.0)
Monocytes Absolute: 1.2 10*3/uL — ABNORMAL HIGH (ref 0.1–1.0)
Monocytes Relative: 7 %
Neutro Abs: 15.3 10*3/uL — ABNORMAL HIGH (ref 1.7–7.7)
Neutrophils Relative %: 85 %
Platelets: 226 10*3/uL (ref 150–400)
RBC: 3.24 MIL/uL — ABNORMAL LOW (ref 4.22–5.81)
RDW: 14.9 % (ref 11.5–15.5)
WBC: 17.8 10*3/uL — ABNORMAL HIGH (ref 4.0–10.5)
nRBC: 0 % (ref 0.0–0.2)

## 2021-11-12 LAB — MAGNESIUM: Magnesium: 2.4 mg/dL (ref 1.7–2.4)

## 2021-11-12 LAB — C-REACTIVE PROTEIN: CRP: 9.9 mg/dL — ABNORMAL HIGH (ref <1.0)

## 2021-11-12 MED ORDER — METHYLPREDNISOLONE SODIUM SUCC 40 MG IJ SOLR
40.0000 mg | INTRAMUSCULAR | Status: DC
  Administered 2021-11-12 – 2021-11-14 (×3): 40 mg via INTRAVENOUS
  Filled 2021-11-12 (×3): qty 1

## 2021-11-12 MED ORDER — AZITHROMYCIN 500 MG PO TABS
500.0000 mg | ORAL_TABLET | Freq: Every day | ORAL | Status: AC
  Administered 2021-11-12 – 2021-11-14 (×3): 500 mg via ORAL
  Filled 2021-11-12 (×3): qty 1

## 2021-11-12 NOTE — Evaluation (Signed)
Occupational Therapy Evaluation Patient Details Name: Anthony Mueller MRN: [id number] DOB: January 29, 1951 Today's Date: 11/12/2021   History of Present Illness 71 yo male presents to [facility] on 1/11, covid + as of 12/25 with x5 day course of paxlovid but had resumption of symptoms 1/9. 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   Clinical Impression   Pt lives with his supportive wife, squat pivot transfers and repels w/c independently. He can dress and toilet independently and is assisted for shower transfer. Pt currently requires min assist for transfers and set up to min assist for ADL. Will follow acutely. Pt and wife are interested in bathroom assessment by [facility] and community resources for renovation.      Recommendations for follow up therapy are one component of a multi-disciplinary discharge planning process, led by the attending physician.  Recommendations may be updated based on patient status, additional functional criteria and insurance authorization.   Follow Up Recommendations  Home health OT    Assistance Recommended at Discharge Intermittent Supervision/Assistance  Patient can return home with the following A little help with walking and/or transfers;A little help with bathing/dressing/bathroom;Assistance with cooking/housework;Assist for transportation;Help with stairs or ramp for entrance    Functional Status Assessment  Patient has had a recent decline in their functional status and/or demonstrates limited ability to make significant improvements in function in a reasonable and predictable amount of time  Equipment Recommendations  None recommended by OT    Recommendations for Other Services       Precautions / Restrictions Precautions Precautions: Fall      Mobility Bed Mobility Overal bed mobility: Modified Independent             General bed mobility comments: returned to  bed without assist    Transfers Overall transfer level: Needs assistance Equipment used: 1 person hand held assist Transfers: Bed to chair/wheelchair/BSC    Squat pivot transfers: Min assist       General transfer comment: steadying assist      Balance Overall balance assessment: Needs assistance;History of Falls Sitting-balance support: Feet supported;No upper extremity supported Sitting balance-Leahy Scale: Good                                     ADL either performed or assessed with clinical judgement   ADL Overall ADL's : Needs assistance/impaired Eating/Feeding: Independent;Bed level   Grooming: Set up;Sitting   Upper Body Bathing: Set up;Sitting   Lower Body Bathing: Minimal assistance;Sitting/lateral leans;Sit to/from stand   Upper Body Dressing : Set up;Sitting   Lower Body Dressing: Minimal assistance;Sit to/from stand Lower Body Dressing Details (indicate cue type and reason): can don socks crossing foot over opposite knee Toilet Transfer: Minimal assistance;Stand-pivot;Comfort height toilet;Grab bars   Toileting- Clothing Manipulation and Hygiene: Sit to/from stand;Moderate assistance       Functional mobility during ADLs: Wheelchair;Modified independent       Vision         Perception     Praxis      Pertinent Vitals/Pain Pain Assessment: No/denies pain     Hand Dominance Right   Extremity/Trunk Assessment Upper Extremity Assessment Upper Extremity Assessment: Overall WFL for tasks assessed   Lower Extremity Assessment Lower Extremity Assessment: Defer to PT evaluation       Communication Communication Communication: Expressive difficulties   Cognition Arousal/Alertness: Awake/alert Behavior During Therapy: [facility] for  tasks assessed/performed Overall Cognitive Status: Within Functional Limits for tasks assessed                                       General Comments       Exercises      Shoulder Instructions      Home Living Family/patient expects to be discharged to:: Private residence Living Arrangements: Spouse/significant other Available Help at Discharge: Family;Available 24 hours/day Type of Home: House Home Access: Ramped entrance     Home Layout: One level     Bathroom Shower/Tub: Occupational psychologist: Handicapped height     Home Equipment: Wheelchair - Education administrator (comment);Grab bars - tub/shower;Rolling Environmental consultant (2 wheels);Wheelchair - power   Additional Comments: IndeeLift to get him off the floor if he falls, does NOT have a hoyer lift for transfers and is independent with squat pivot transfers at baseline      Prior Functioning/Environment Prior Level of Function : Needs assist             Mobility Comments: occasionally uses recumbant bike; transfers self from w/c to toilet, gets in and out of bed without assist of wife. ADLs Comments: has assist from wife for showering and transfer into shower (shower has a glass door, pt states he puts his hands on wife's shoulders to boost up, then grabs the frame of the shower)        OT Problem List: Impaired balance (sitting and/or standing);Decreased coordination;Other (comment) (decreased activity tolerance)      OT Treatment/Interventions: Self-care/ADL training;Patient/family education;Balance training;Therapeutic activities    OT Goals(Current goals can be found in the care plan section) Acute Rehab OT Goals OT Goal Formulation: With patient Time For Goal Achievement: 11/26/21 Potential to Achieve Goals: Good ADL Goals Pt Will Perform Lower Body Bathing: with supervision;sit to/from stand;sitting/lateral leans Pt Will Perform Lower Body Dressing: with supervision;sitting/lateral leans;sit to/from stand Pt Will Transfer to Toilet: with supervision;squat pivot transfer;grab bars Pt Will Perform Toileting - Clothing Manipulation and hygiene: with supervision;sitting/lateral  leans;sit to/from stand  OT Frequency: Min 2X/week    Co-evaluation              AM-PAC OT "6 Clicks" Daily Activity     Outcome Measure Help from another person eating meals?: None Help from another person taking care of personal grooming?: A Little Help from another person toileting, which includes using toliet, bedpan, or urinal?: A Little Help from another person bathing (including washing, rinsing, drying)?: A Lot Help from another person to put on and taking off regular upper body clothing?: A Little Help from another person to put on and taking off regular lower body clothing?: A Lot 6 Click Score: 17   End of Session Equipment Utilized During Treatment: Oxygen  Activity Tolerance: Patient tolerated treatment well Patient left: in bed;with call bell/phone within reach;with family/visitor present  OT Visit Diagnosis: Unsteadiness on feet (R26.81)                Time: 8338-2505 OT Time Calculation (min): 33 min Charges:  OT General Charges $OT Visit: 1 Visit OT Evaluation $OT Eval Moderate Complexity: 1 Mod OT Treatments $Self Care/Home Management : 8-22 mins  Nestor Lewandowsky, OTR/L Acute Rehabilitation Services Pager: [phone] Office: [phone]  Malka So 11/12/2021, 3:31 PM

## 2021-11-12 NOTE — Plan of Care (Signed)
°  Problem: Education: Goal: Knowledge of risk factors and measures for prevention of condition will improve Outcome: Progressing   Problem: Coping: Goal: Psychosocial and spiritual needs will be supported Outcome: Progressing   Problem: Respiratory: Goal: Will maintain a patent airway Outcome: Progressing Goal: Complications related to the disease process, condition or treatment will be avoided or minimized Outcome: Progressing   Problem: Education: Goal: Knowledge of General Education information will improve Description: Including pain rating scale, medication(s)/side effects and non-pharmacologic comfort measures Outcome: Progressing   Problem: Health Behavior/Discharge Planning: Goal: Ability to manage health-related needs will improve Outcome: Progressing   Problem: Clinical Measurements: Goal: Ability to maintain clinical measurements within normal limits will improve Outcome: Progressing Goal: Will remain free from infection Outcome: Progressing Goal: Diagnostic test results will improve Outcome: Progressing

## 2021-11-12 NOTE — Progress Notes (Signed)
PHARMACIST - PHYSICIAN COMMUNICATION DR:   Tana Coast CONCERNING: Antibiotic IV to Oral Route Change Policy  RECOMMENDATION: This patient is receiving azithromycin by the intravenous route.  Based on criteria approved by the Pharmacy and Therapeutics Committee, the antibiotic(s) is/are being converted to the equivalent oral dose form(s).   DESCRIPTION: These criteria include: Patient being treated for a respiratory tract infection, urinary tract infection, cellulitis or clostridium difficile associated diarrhea if on metronidazole The patient is not neutropenic and does not exhibit a GI malabsorption state The patient is eating (either orally or via tube) and/or has been taking other orally administered medications for a least 24 hours The patient is improving clinically and has a Tmax < 100.5  If you have questions about this conversion, please contact the Pharmacy Department  []   [phone] )  [facility] []   [phone] )  [facility] [x]   [phone] )  [facility] []   [phone] )  [facility] []   [phone] )  [facility]

## 2021-11-12 NOTE — Progress Notes (Signed)
Not physically seen today, checked chart. Consulted on yesterday for abnormal liver chemistries.   Tbili has normalized. Alk phos remains normal. Pattern of injury was mainly hepatocellular. Suspect DILI related to Paxlovid.Also, COVID19 may have been contributing factor.  There was question of acute cholecystitis but [facility] has evaluated and agree this does seem unlikely in absence of abdominal pain / normal WBC.   Recommend patient get follow up labs with his primary of even his gastroenterologist in next couple of weeks to ensure normalization of liver tests.   GI will sign off. Call for questions.

## 2021-11-12 NOTE — Progress Notes (Signed)
Progress Note   Patient: Anthony Mueller ZTI:[id number] DOB: October 16, 1951 DOA: 11/10/2021     2 DOS: the patient was seen and examined on 11/12/2021   Brief hospital course: Patient is a 71 year old male with hypertension, hyperlipidemia, diabetes mellitus type 2, OSA on CPAP, and cerebellar degeneration who is wheelchair-bound at baseline presented with shortness of breath, fevers, chills, worsening cough.  Also reported nausea vomiting diarrhea, dizziness, generalized weakness.  Febrile at the time of admission 101.6.  Currently, pulse 95 200, BP 174/77.  Hypoxia with O2 sats 88% on room air. Patient had tested positive for COVID on 12/25 and had completed 5-day course of Paxlovid.  Also noted to have transaminitis, concern for cholecystitis on abdominal ultrasound and MRCP.  GI, general surgery consulted.  No plans for surgical intervention, recommended outpatient follow-up      Assessment and Plan * Acute respiratory failure with hypoxia ([place])- (present on admission) - Presented with hypoxia, O2 sats 88% on room air, leukocytosis, shortness of breath, nonproductive cough, fevers.  Patient is vaccinated and had boosters for COVID.  Chest x-ray showed moderate cardiac enlargement, increased from previous x-rays, large hiatal hernia.  UA negative for UTI. -Tested COVID-19 positive on 12/25, has completed 5-day course of Paxlovid outpatient. -O2 sats stable in mid 80s to high 80s on room air, placed back on 3 L O2 -Will place on IV Solu-Medrol today, home O2 evaluation in a.m.  Transaminitis- (present on admission) - Possibly due to COVID-19 and paxlovid.  - LFTs improving - Appreciate GI and general surgery recommendations  COVID-19 virus infection- (present on admission) - Patient tested positive with COVID-19 on 12/25, has completed 5-day course of Paxlovid.  -Due to persistent hypoxia, placed on IV Solu-Medrol, continue antibiotics  -CRP improving -CTA negative for acute PE, venous  Dopplers negative for DVT.    Cholelithiasis- (present on admission) - No acute abdominal pain, has transaminitis.  Abd US showed possible obstruction and thickening of gallbladder wall to suggest chronic cholecystitis -MRCP showed cholelithiasis, mild gallbladder distention and borderline mild diffuse gallbladder wall thickening, findings equivocal for acute cholecystitis.  No biliary duct dilatation -Appreciate GI and general surgery recommendations.  No plan for surgical intervention at this time. -Tolerating advanced diet, outpatient follow-up with GI and surgery  Nausea vomiting and diarrhea- (present on admission) - Likely due to possible rebound COVID-19 symptoms, improving -Continue supportive treatment  Hypokalemia- (present on admission) - Stable  Type 2 diabetes mellitus with hyperlipidemia ([place])- (present on admission) - NIDDM, on Tradjenta and metformin outpatient -Follow CBGs closely, expected to rise with steroids.  Hemoglobin A1c 7.3. -Continue SSI, increase to moderate, continue Tradjenta  Recent Labs    11/10/21 1228 11/10/21 2232 11/11/21 0759 11/11/21 1128  GLUCAP 181* 238* 174* 148*    Cardiomegaly- (present on admission) - 2D echo showed EF of 55 to 60%, no regional WMA -Continue Lasix 20 mg daily -Hold ARB due to mild acute renal insufficiency  GERD (gastroesophageal reflux disease)- (present on admission) Continue PPI  Cerebellar degeneration (HCC) - History of cerebellar degeneration, wheelchair-bound at baseline    Subjective: Still having persistent hypoxia, O2 sats 85 to 88% on room air, improved to high 90s on O2 3 L.,  Still having some cough and wheezing.  No abdominal pain  Objective BP 115/84    Pulse 74    Temp (!) 97.5 F (36.4 C) (Oral)    Resp 18    Ht 6\' 1"  (1.854 m)    Wt 113.4 kg  SpO2 93%    BMI 32.98 kg/m   Physical Exam General: Alert and oriented x 3, NAD Cardiovascular: S1 S2 clear, RRR. No pedal edema b/l Respiratory:  Mild scattered wheezing Gastrointestinal: Soft, nontender, nondistended, NBS Ext: no pedal edema bilaterally Neuro: no new deficits Skin: No rashes Psych: Normal affect and demeanor, alert and oriented x3    Data Reviewed:  BMP Latest Ref Rng & Units 11/12/2021 11/11/2021 11/10/2021  Glucose 70 - 99 mg/dL 187(H) 222(H) 191(H)  BUN 8 - 23 mg/dL 22 14 12   Creatinine 0.61 - 1.24 mg/dL 1.45(H) 1.18 1.15  Sodium 135 - 145 mmol/L 138 138 139  Potassium 3.5 - 5.1 mmol/L 3.9 3.7 3.3(L)  Chloride 98 - 111 mmol/L 97(L) 98 96(L)  CO2 22 - 32 mmol/L 29 29 28   Calcium 8.9 - 10.3 mg/dL 8.1(L) 7.8(L) 8.4(L)    CBC Latest Ref Rng & Units 11/12/2021 11/11/2021 11/10/2021  WBC 4.0 - 10.5 K/uL 17.8(H) 13.0(H) 14.6(H)  Hemoglobin 13.0 - 17.0 g/dL 9.4(L) 9.4(L) 11.7(L)  Hematocrit 39.0 - 52.0 % 28.9(L) 28.1(L) 36.3(L)  Platelets 150 - 400 K/uL 226 171 217     Family Communication: Updated patient's wife at the bedside  Disposition: Status is: Inpatient  Remains inpatient appropriate because: Still having persistent hypoxia today, changed to IV Solu-Medrol.  Hopefully DC home in 24 to 48 hours      Time spent: 35 minutes   Author: Estill Cotta, MD 11/12/2021 3:29 PM  For on call review [email or web address].

## 2021-11-12 NOTE — Plan of Care (Signed)
°  Problem: Education: Goal: Knowledge of risk factors and measures for prevention of condition will improve Outcome: Progressing   Problem: Activity: Goal: Risk for activity intolerance will decrease Outcome: Progressing   Problem: Safety: Goal: Ability to remain free from injury will improve Outcome: Progressing   Problem: Skin Integrity: Goal: Risk for impaired skin integrity will decrease Outcome: Progressing

## 2021-11-12 NOTE — Plan of Care (Signed)
°  Problem: Education: Goal: Knowledge of General Education information will improve Description: Including pain rating scale, medication(s)/side effects and non-pharmacologic comfort measures Outcome: Progressing   Problem: Clinical Measurements: Goal: Will remain free from infection Outcome: Progressing   Problem: Activity: Goal: Risk for activity intolerance will decrease Outcome: Progressing   Problem: Elimination: Goal: Will not experience complications related to bowel motility Outcome: Progressing

## 2021-11-12 NOTE — Progress Notes (Signed)
Attending Anthony Mueller notified via Amion that patient had 2.14 pause. Patient is asleep and resting well at this time.

## 2021-11-12 NOTE — Progress Notes (Signed)
Patient has had second pause within the past hour. Most recent 2.59. Second page sent to floor coverage X. Blount. Awaiting response.

## 2021-11-13 DIAGNOSIS — I480 Paroxysmal atrial fibrillation: Secondary | ICD-10-CM

## 2021-11-13 DIAGNOSIS — I4819 Other persistent atrial fibrillation: Secondary | ICD-10-CM | POA: Insufficient documentation

## 2021-11-13 DIAGNOSIS — I4891 Unspecified atrial fibrillation: Secondary | ICD-10-CM | POA: Diagnosis present

## 2021-11-13 DIAGNOSIS — J9601 Acute respiratory failure with hypoxia: Secondary | ICD-10-CM

## 2021-11-13 HISTORY — DX: Other persistent atrial fibrillation: I48.19

## 2021-11-13 HISTORY — DX: Unspecified atrial fibrillation: I48.91

## 2021-11-13 LAB — CBC WITH DIFFERENTIAL/PLATELET
Abs Immature Granulocytes: 0.11 10*3/uL — ABNORMAL HIGH (ref 0.00–0.07)
Basophils Absolute: 0 10*3/uL (ref 0.0–0.1)
Basophils Relative: 0 %
Eosinophils Absolute: 0 10*3/uL (ref 0.0–0.5)
Eosinophils Relative: 0 %
HCT: 29.6 % — ABNORMAL LOW (ref 39.0–52.0)
Hemoglobin: 9.9 g/dL — ABNORMAL LOW (ref 13.0–17.0)
Immature Granulocytes: 1 %
Lymphocytes Relative: 9 %
Lymphs Abs: 0.9 10*3/uL (ref 0.7–4.0)
MCH: 29.4 pg (ref 26.0–34.0)
MCHC: 33.4 g/dL (ref 30.0–36.0)
MCV: 87.8 fL (ref 80.0–100.0)
Monocytes Absolute: 0.6 10*3/uL (ref 0.1–1.0)
Monocytes Relative: 6 %
Neutro Abs: 9 10*3/uL — ABNORMAL HIGH (ref 1.7–7.7)
Neutrophils Relative %: 84 %
Platelets: 240 10*3/uL (ref 150–400)
RBC: 3.37 MIL/uL — ABNORMAL LOW (ref 4.22–5.81)
RDW: 14.7 % (ref 11.5–15.5)
WBC: 10.7 10*3/uL — ABNORMAL HIGH (ref 4.0–10.5)
nRBC: 0 % (ref 0.0–0.2)

## 2021-11-13 LAB — COMPREHENSIVE METABOLIC PANEL
ALT: 104 U/L — ABNORMAL HIGH (ref 0–44)
AST: 39 U/L (ref 15–41)
Albumin: 2.7 g/dL — ABNORMAL LOW (ref 3.5–5.0)
Alkaline Phosphatase: 66 U/L (ref 38–126)
Anion gap: 11 (ref 5–15)
BUN: 22 mg/dL (ref 8–23)
CO2: 28 mmol/L (ref 22–32)
Calcium: 8.4 mg/dL — ABNORMAL LOW (ref 8.9–10.3)
Chloride: 99 mmol/L (ref 98–111)
Creatinine, Ser: 1.13 mg/dL (ref 0.61–1.24)
GFR, Estimated: >60 mL/min (ref >60)
Glucose, Bld: 190 mg/dL — ABNORMAL HIGH (ref 70–99)
Potassium: 3.7 mmol/L (ref 3.5–5.1)
Sodium: 138 mmol/L (ref 135–145)
Total Bilirubin: 0.8 mg/dL (ref 0.3–1.2)
Total Protein: 6.4 g/dL — ABNORMAL LOW (ref 6.5–8.1)

## 2021-11-13 LAB — PHOSPHORUS: Phosphorus: 4 mg/dL (ref 2.5–4.6)

## 2021-11-13 LAB — MAGNESIUM: Magnesium: 2 mg/dL (ref 1.7–2.4)

## 2021-11-13 LAB — D-DIMER, QUANTITATIVE: D-Dimer, Quant: 1.76 ug/mL-FEU — ABNORMAL HIGH (ref 0.00–0.50)

## 2021-11-13 LAB — GLUCOSE, CAPILLARY
Glucose-Capillary: 229 mg/dL — ABNORMAL HIGH (ref 70–99)
Glucose-Capillary: 132 mg/dL — ABNORMAL HIGH (ref 70–99)
Glucose-Capillary: 221 mg/dL — ABNORMAL HIGH (ref 70–99)
Glucose-Capillary: 254 mg/dL — ABNORMAL HIGH (ref 70–99)

## 2021-11-13 LAB — C-REACTIVE PROTEIN: CRP: 8.9 mg/dL — ABNORMAL HIGH (ref <1.0)

## 2021-11-13 MED ORDER — INSULIN ASPART 100 UNIT/ML IJ SOLN
0.0000 [IU] | Freq: Three times a day (TID) | INTRAMUSCULAR | Status: DC
  Administered 2021-11-13: 5 [IU] via SUBCUTANEOUS
  Administered 2021-11-14 (×2): 8 [IU] via SUBCUTANEOUS
  Administered 2021-11-14: 2 [IU] via SUBCUTANEOUS
  Administered 2021-11-15 (×3): 5 [IU] via SUBCUTANEOUS
  Administered 2021-11-16: 2 [IU] via SUBCUTANEOUS

## 2021-11-13 MED ORDER — FUROSEMIDE 10 MG/ML IJ SOLN
40.0000 mg | Freq: Once | INTRAMUSCULAR | Status: AC
Start: 2021-11-13 — End: 2021-11-13
  Administered 2021-11-13: 40 mg via INTRAVENOUS
  Filled 2021-11-13: qty 4

## 2021-11-13 MED ORDER — INSULIN GLARGINE-YFGN 100 UNIT/ML ~~LOC~~ SOLN
7.0000 [IU] | Freq: Every day | SUBCUTANEOUS | Status: DC
  Administered 2021-11-13: 7 [IU] via SUBCUTANEOUS
  Filled 2021-11-13 (×2): qty 0.07

## 2021-11-13 MED ORDER — CLONIDINE HCL 0.2 MG PO TABS
0.2000 mg | ORAL_TABLET | Freq: Every day | ORAL | Status: DC
  Administered 2021-11-13 – 2021-11-16 (×4): 0.2 mg via ORAL
  Filled 2021-11-13 (×4): qty 1

## 2021-11-13 MED ORDER — HYDRALAZINE HCL 20 MG/ML IJ SOLN
10.0000 mg | Freq: Four times a day (QID) | INTRAMUSCULAR | Status: DC | PRN
  Administered 2021-11-13 – 2021-11-15 (×4): 10 mg via INTRAVENOUS
  Filled 2021-11-13 (×4): qty 1

## 2021-11-13 NOTE — Assessment & Plan Note (Addendum)
-   Patient noted to be in atrial fibrillation, bradycardia with sinus pauses -Per cardiology, started on apixaban, will need to be stopped prior to his upcoming rectal surgery (stop 5 days prior to the surgery and then resume once cleared by surgery service). -Coreg resumed 25 mg p.o. twice daily, continue eliquis

## 2021-11-13 NOTE — Progress Notes (Addendum)
Patient has had 2.38 pause. Attending Jeannette Corpus was made aware.  73: Patient has had a second pause of 2.4. Patient is resting at this time.

## 2021-11-13 NOTE — Progress Notes (Signed)
Provider made aware BP 163/96, BO medication given this afternoon.  Provider ordered PRN hydralazine order.

## 2021-11-13 NOTE — Progress Notes (Signed)
Patient HR dipped to 30's, did not sustain patient asymp. MD made aware. See MD notes

## 2021-11-13 NOTE — Plan of Care (Signed)
°  Problem: Respiratory: Goal: Will maintain a patent airway Outcome: Progressing Goal: Complications related to the disease process, condition or treatment will be avoided or minimized Outcome: Progressing   Problem: Clinical Measurements: Goal: Ability to maintain clinical measurements within normal limits will improve Outcome: Progressing Goal: Will remain free from infection Outcome: Progressing Goal: Diagnostic test results will improve Outcome: Progressing Goal: Respiratory complications will improve Outcome: Progressing Goal: Cardiovascular complication will be avoided Outcome: Progressing   Problem: Activity: Goal: Risk for activity intolerance will decrease Outcome: Progressing   Problem: Nutrition: Goal: Adequate nutrition will be maintained Outcome: Progressing   Problem: Elimination: Goal: Will not experience complications related to bowel motility Outcome: Progressing Goal: Will not experience complications related to urinary retention Outcome: Progressing   Problem: Pain Managment: Goal: General experience of comfort will improve Outcome: Progressing   Problem: Safety: Goal: Ability to remain free from injury will improve Outcome: Progressing    Problem: Skin Integrity: Goal: Risk for impaired skin integrity will decrease Outcome: Progressing

## 2021-11-13 NOTE — Plan of Care (Signed)
°  Problem: Education: Goal: Knowledge of risk factors and measures for prevention of condition will improve Outcome: Progressing   Problem: Safety: Goal: Ability to remain free from injury will improve Outcome: Progressing   Problem: Skin Integrity: Goal: Risk for impaired skin integrity will decrease Outcome: Progressing

## 2021-11-13 NOTE — Consult Note (Signed)
Cardiology Admission History and Physical:   Patient ID: Anthony Mueller MRN: [id number]; DOB: 1951-05-12   Admission date: 11/10/2021  PCP:  Anthony Berger, MD   [facility] Providers Cardiologist:  None       Chief Complaint:  Asymptomatic bradycardia  Patient Profile:   Anthony Mueller is a 71 y.o. male with HTN with DM and HLD with DM, Cerebellar degeneration who is being seen 11/13/2021 for the evaluation of bradycardia.  History of Present Illness:   Mr. Anthony Mueller notes that he is feeling fine presently.  Patient has initially presented with fevers, chills, SOB,a nd cough.  Found to be COVID-19.  He has started on oxygen and has had a 5 day course of Paxlovid.   (Originally diagnosed 10/24/22)  Had has new atrial fibrillation this admission.  Has asymptomatic heart rate to 30s this AM- cardiology was called. His home coreg has since been discontinued. Has has new cholecystitis and is awaiting MRCP.  Has had no chest pain, chest pressure, chest tightness, chest stinging.  Had no symptoms when he had low heart rate.  He is wheelchair bound but with normal mentation.No shortness of breath, DOE (is still on Ox and as received lasix IV X1.  No PND or orthopnea.  No syncope or near syncope . Notes  no palpitations or funny heart beats.      Past Medical History:  Diagnosis Date   Cerebellar degeneration ([place])    Diabetes mellitus without complication ([place])    Hyperlipidemia    Hypertension     History reviewed. No pertinent surgical history.   Medications Prior to Admission: Prior to Admission medications   Medication Sig Start Date End Date Taking? Authorizing Provider  allopurinol (ZYLOPRIM) 100 MG tablet Take 100 mg by mouth in the morning.   Yes [provider]  allopurinol (ZYLOPRIM) 300 MG tablet Take 300 mg by mouth every evening. 10/29/21  Yes [provider]  aspirin EC 81 MG tablet Take 81 mg by mouth every evening. Swallow whole.   Yes [provider]  candesartan (ATACAND) 32 MG tablet Take 32 mg by mouth daily. 09/13/21  Yes [provider]  carvedilol (COREG) 25 MG tablet Take 25 mg by mouth 2 (two) times daily. 10/19/21  Yes [provider]  cloNIDine (CATAPRES) 0.2 MG tablet Take 0.2 mg by mouth 2 (two) times daily. 10/28/21  Yes [provider]  felodipine (PLENDIL) 10 MG 24 hr tablet Take 10 mg by mouth daily. 10/28/21  Yes [provider]  Ferrous Sulfate (IRON SLOW RELEASE) 143 (45 Fe) MG TBCR Take 143 mg by mouth every evening.   Yes [provider]  furosemide (LASIX) 20 MG tablet Take 20 mg by mouth daily. 08/30/21  Yes [provider]  lovastatin (MEVACOR) 40 MG tablet Take 40 mg by mouth every evening. 09/20/21  Yes [provider]  metFORMIN (GLUCOPHAGE) 500 MG tablet Take 1,000 mg by mouth 2 (two) times daily. 08/17/21  Yes [provider]  omeprazole (PRILOSEC) 20 MG capsule Take 20 mg by mouth daily. 11/04/21  Yes [provider]  tamsulosin (FLOMAX) 0.4 MG CAPS capsule Take 0.4 mg by mouth daily. 08/30/21  Yes [provider]  timolol (TIMOPTIC) 0.5 % ophthalmic solution Place 1 drop into both eyes daily. 09/13/21  Yes [provider]  TRADJENTA 5 MG TABS tablet Take 5 mg by mouth daily. 09/20/21  Yes [provider]     Allergies:    Allergies  Allergen Reactions   Liraglutide     Other reaction(s): Other (See Comments) CAN NOT TAKE BECAUSE A SIDE EFFECT IS PANCREATITIS AND HE HAS A HISTORY Contraindicated due to pancreatitis    Hydrocodone     Other reaction(s): Other (See Comments) MAKES FEEL SPACEY AND LIGHT HEADED   Hydrocodone-Acetaminophen     Other reaction(s): Other (See Comments) Feels " orbity"   Other     Other reaction(s): Other (See Comments) BANDAID  CAUSES RASH   Tape Other (See Comments)    Skin irritation, welts   Latex Rash    Band-aids   Metronidazole Nausea And Vomiting     Other reaction(s): GI Upset (intolerance)  AND DEPRESSION ;  TABLETS Does fine w/ IV Flagyl     Social History:   Social History   Socioeconomic History   Marital status: Married    Spouse name: Not on file   Number of children: Not on file   Years of education: Not on file   Highest education level: Not on file  Occupational History   Not on file  Tobacco Use   Smoking status: Never   Smokeless tobacco: Never  Substance and Sexual Activity   Alcohol use: Not Currently   Drug use: Never   Sexual activity: Not on file  Other Topics Concern   Not on file  Social History Narrative   Not on file   Social Determinants of Health   Financial Resource Strain: Not on file  Food Insecurity: Not on file  Transportation Needs: Not on file  Physical Activity: Not on file  Stress: Not on file  Social Connections: Not on file  Intimate Partner Violence: Not on file    Family History:   History of coronary artery disease notable for no members. History of heart failure notable for no members. History of arrhythmia notable for no members.  ROS:  Please see the history of present illness.  All other ROS reviewed and negative.     Physical Exam/Data:   Vitals:   11/13/21 0541 11/13/21 0700 11/13/21 1000 11/13/21 1153  BP:    (!) 147/93  Pulse:    71  Resp:      Temp: 97.7 F (36.5 C)   98 F (36.7 C)  TempSrc: Oral   Oral  SpO2:  97% 99% 99%  Weight:      Height:        Intake/Output Summary (Last 24 hours) at 11/13/2021 1236 Last data filed at 11/12/2021 2207 Gross per 24 hour  Intake 360 ml  Output 650 ml  Net -290 ml   Last 3 Weights 11/10/2021  Weight (lbs) 250 lb  Weight (kg) 113.399 kg     Body mass index is 32.98 kg/m.  General:  Well nourished, well developed, in no acute distress HEENT: normal Neck: thick neck Vascular: No carotid bruits Cardiac:  normal S1, S2; IRIR Lungs:  decreased breath sounds Abd: soft, nontender, no hepatomegaly  Ext:  trace non pitting edema Musculoskeletal:  No deformities moves arms without incident Skin: warm and dry  Psych:  Normal affect   EKG:  The ECG that was done  was personally reviewed and demonstrates  11/13/21: AF 71  Transthoracic Echocardiogram: Date: 11/11/21 Results:  1. Left ventricular ejection fraction, by estimation, is 55 to 60%. The  left ventricle has normal function. The left ventricle has no regional  wall motion abnormalities. There is mild left ventricular hypertrophy.  Left ventricular diastolic parameters  were normal.  2. Right ventricular systolic function is normal. The right ventricular  size is normal. There is mildly elevated pulmonary artery systolic  pressure. The estimated right ventricular systolic pressure is 17.7 mmHg.   3. Left atrial size was moderately dilated.   4. Right atrial size was mildly dilated.   5. The mitral valve is normal in structure. Mild mitral valve  regurgitation. No evidence of mitral stenosis.   6. The aortic valve is tricuspid. Aortic valve regurgitation is trivial.  Aortic valve sclerosis/calcification is present, without any evidence of  aortic stenosis.   7. The inferior vena cava is dilated in size with <50% respiratory  variability, suggesting right atrial pressure of 15 mmHg.   CTPE: Date: 11/10/21 Results: CAC and Aortic atherosclerosis no PE   Laboratory Data:  High Sensitivity Troponin:   Recent Labs  Lab 11/10/21 1214 11/10/21 1324  TROPONINIHS 40* 44*      Chemistry Recent Labs  Lab 11/12/21 0226 11/13/21 0149  NA 138 138  K 3.9 3.7  CL 97* 99  CO2 29 28  GLUCOSE 187* 190*  BUN 22 22  CREATININE 1.45* 1.13  CALCIUM 8.1* 8.4*  MG 2.4 2.0  GFRNONAA 52* >60  ANIONGAP 12 11    Recent Labs  Lab 11/12/21 0226 11/13/21 0149  PROT 6.6 6.4*  ALBUMIN 2.8* 2.7*  AST 51* 39  ALT 116* 104*  ALKPHOS 69 66  BILITOT 0.8 0.8   Lipids No results for input(s): CHOL, TRIG, HDL, LABVLDL, LDLCALC, CHOLHDL  in the last 168 hours. Hematology Recent Labs  Lab 11/12/21 0226 11/13/21 0149  WBC 17.8* 10.7*  RBC 3.24* 3.37*  HGB 9.4* 9.9*  HCT 28.9* 29.6*  MCV 89.2 87.8  MCH 29.0 29.4  MCHC 32.5 33.4  RDW 14.9 14.7  PLT 226 240   Thyroid No results for input(s): TSH, FREET4 in the last 168 hours. BNP Recent Labs  Lab 11/10/21 1214  BNP 426.9*    DDimer  Recent Labs  Lab 11/13/21 0149  DDIMER 1.76*     Radiology/Studies:  No results found.   Assessment and Plan:   PAF duration unknown (asymptomatic) CHASDVASC is 3 HTN Hx of MVP AF pause off of  - yearly stroke risk is 3%; when patient has finished work up for elevated liver enzymes (no plan for ERCP or procedural interventions), will stop ASA and start DOAC (eliquis 5 mg PO BID or based on preferred pharmacy) - this can be stopped prior to his upcoming rectal surgery - I suspect his BP will rebound and we may need to restart clonidine  - he had an asymptomatic pause AF pause < 5 s; will likely start back coreg at lower dose 11/14/21  - outpatient LDL therapy based on liver enzymes as he has aortic atherosclerosis and CAC - will follow tomorrow to help solidify BP/AC plan  Patient can follow once with me as outpatient, he lives in [place] and ideally would like to follow in [place]   For questions or updates, please contact [place] Please consult [email or web address] for contact info under     Signed, Werner Lean, MD  11/13/2021 12:36 PM

## 2021-11-13 NOTE — Progress Notes (Signed)
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  Also reported nausea vomiting diarrhea, dizziness, generalized weakness.  Febrile at the time of admission 101.6.  Currently, pulse 95 200, BP 174/77.  Hypoxia with O2 sats 88% on room air. Patient had tested positive for COVID on 12/25 and had completed 5-day course of Paxlovid.  Also noted to have transaminitis, concern for cholecystitis on abdominal ultrasound and MRCP.  GI, general surgery consulted.  No plans for surgical intervention, recommended outpatient follow-up    Assessment and Plan * Acute respiratory failure with hypoxia ([place])- (present on admission) - Presented with hypoxia, O2 sats 88% on room air, leukocytosis, shortness of breath, nonproductive cough, fevers.  Patient is vaccinated and had boosters for COVID.  Chest x-ray showed moderate cardiac enlargement, increased from previous x-rays, large hiatal hernia.  UA negative for UTI. -Tested COVID-19 positive on 12/25, has completed 5-day course of Paxlovid outpatient. -O2 sats in high on 6 L O2 this morning, wean O2 as tolerated.  - will also give Lasix 40 mg IV x1 given elevated BNP on admission, creatinine now stable -Continue IV Solu-Medrol  Unspecified atrial fibrillation (HCC) with sinus pauses- (present on admission) - Patient noted to be in atrial fibrillation, bradycardia with sinus pauses, longest 2.4s this a.m., 2.7s yesterday -For now hold Coreg, clonidine.  Consulted cardiology, (d/w Dr Percival Spanish), will await recommendations  Transaminitis- (present on admission) - Possibly due to COVID-19 and paxlovid.  - LFTs  improving - Appreciate GI and general surgery recommendations  COVID-19 virus infection- (present on admission) - Patient tested positive with COVID-19 on 12/25, has completed 5-day course of Paxlovid.  -Continue IV Solu-Medrol, antibiotics, still has persistent hypoxia, on 6 L O2 via [place] this AM, wean O2 as tolerated -CTA negative for acute PE, venous Dopplers negative for DVT.    Cholelithiasis- (present on admission) - No acute abdominal pain, has transaminitis.  Abd US showed possible obstruction and thickening of gallbladder wall to suggest chronic cholecystitis -MRCP showed cholelithiasis, mild gallbladder distention and borderline mild diffuse gallbladder wall thickening, findings equivocal for acute cholecystitis.  No biliary duct dilatation -Appreciate GI and general surgery recommendations.  No plan for surgical intervention at this time. -Tolerating advanced diet, outpatient follow-up with GI and surgery  Nausea vomiting and diarrhea- (present on admission) - Likely due to possible rebound COVID-19 symptoms, improving -Improved, tolerating diet, continue supportive treatment  Hypokalemia- (present on admission) - Stable  Type 2 diabetes mellitus with hyperlipidemia ([place])- (present on admission) - NIDDM, on Tradjenta and metformin outpatient -Hemoglobin A1c 7.3. -CBGs uncontrolled in 200s, increase NovoLog sliding scale insulin to moderate, added Semglee 7 units daily, continue Tradjenta  Recent Labs    11/12/21 0718 11/12/21 1141 11/12/21 1613 11/12/21 2205 11/13/21 0748 11/13/21 1134  GLUCAP 147* 161* 254* 259* 229* 132*      Cardiomegaly- (present on admission) - 2D echo showed EF of 55 to 60%, no regional WMA -Hold Lasix p.o., will give IV 40 mg x 1 -Hold ARB due to mild acute renal insufficiency  GERD (gastroesophageal reflux disease)- (present on admission) Continue PPI  Cerebellar degeneration (HCC) - History of cerebellar degeneration, wheelchair-bound  at baseline  Subjective: Still having hypoxia on 6 L O2 via [place] this a.m. on exam.  No chest pain, still feels winded on exertion.  Wife at the bedside.  Overnight had sinus pauses x2, longest to 2.4s this AM.  Denied any dizziness, lightheadedness, syncope.  No fevers or chills.  Objective Vitals:   11/13/21 1000 11/13/21 1153  BP:  (!) 147/93  Pulse:  71  Resp:    Temp:  98 F (36.7 C)  SpO2: 99% 99%    Physical Exam General: Alert and oriented x 3, NAD Cardiovascular: S1 S2 clear, RRR.  Trace pedal edema b/l Respiratory: Diminished breath sound at the bases Gastrointestinal: Soft, nontender, nondistended, NBS Ext: trace pedal edema bilaterally Neuro: no new deficits   Data Reviewed:   Family Communication: Discussed with wife at the bedside  Disposition: Status is: Inpatient  Remains inpatient appropriate because: Persistent hypoxia, new atrial fibrillation, bradycardia, sinus pauses    Time spent: 35 minutes   Author: Estill Cotta, MD 11/13/2021 1:11 PM  For on call review [email or web address].

## 2021-11-14 ENCOUNTER — Other Ambulatory Visit: Payer: Self-pay | Admitting: Internal Medicine

## 2021-11-14 LAB — GLUCOSE, CAPILLARY
Glucose-Capillary: 131 mg/dL — ABNORMAL HIGH (ref 70–99)
Glucose-Capillary: 271 mg/dL — ABNORMAL HIGH (ref 70–99)
Glucose-Capillary: 296 mg/dL — ABNORMAL HIGH (ref 70–99)
Glucose-Capillary: 219 mg/dL — ABNORMAL HIGH (ref 70–99)

## 2021-11-14 LAB — CBC WITH DIFFERENTIAL/PLATELET
Abs Immature Granulocytes: 0.38 10*3/uL — ABNORMAL HIGH (ref 0.00–0.07)
Basophils Absolute: 0.1 10*3/uL (ref 0.0–0.1)
Basophils Relative: 0 %
Eosinophils Absolute: 0 10*3/uL (ref 0.0–0.5)
Eosinophils Relative: 0 %
HCT: 34.2 % — ABNORMAL LOW (ref 39.0–52.0)
Hemoglobin: 11 g/dL — ABNORMAL LOW (ref 13.0–17.0)
Immature Granulocytes: 3 %
Lymphocytes Relative: 14 %
Lymphs Abs: 1.7 10*3/uL (ref 0.7–4.0)
MCH: 28.3 pg (ref 26.0–34.0)
MCHC: 32.2 g/dL (ref 30.0–36.0)
MCV: 87.9 fL (ref 80.0–100.0)
Monocytes Absolute: 1.1 10*3/uL — ABNORMAL HIGH (ref 0.1–1.0)
Monocytes Relative: 9 %
Neutro Abs: 9.4 10*3/uL — ABNORMAL HIGH (ref 1.7–7.7)
Neutrophils Relative %: 74 %
Platelets: 325 10*3/uL (ref 150–400)
RBC: 3.89 MIL/uL — ABNORMAL LOW (ref 4.22–5.81)
RDW: 14.7 % (ref 11.5–15.5)
WBC: 12.6 10*3/uL — ABNORMAL HIGH (ref 4.0–10.5)
nRBC: 0.2 % (ref 0.0–0.2)

## 2021-11-14 LAB — COMPREHENSIVE METABOLIC PANEL
ALT: 112 U/L — ABNORMAL HIGH (ref 0–44)
AST: 42 U/L — ABNORMAL HIGH (ref 15–41)
Albumin: 2.9 g/dL — ABNORMAL LOW (ref 3.5–5.0)
Alkaline Phosphatase: 76 U/L (ref 38–126)
Anion gap: 8 (ref 5–15)
BUN: 15 mg/dL (ref 8–23)
CO2: 30 mmol/L (ref 22–32)
Calcium: 8.6 mg/dL — ABNORMAL LOW (ref 8.9–10.3)
Chloride: 100 mmol/L (ref 98–111)
Creatinine, Ser: 1.07 mg/dL (ref 0.61–1.24)
GFR, Estimated: >60 mL/min (ref >60)
Glucose, Bld: 167 mg/dL — ABNORMAL HIGH (ref 70–99)
Potassium: 4.2 mmol/L (ref 3.5–5.1)
Sodium: 138 mmol/L (ref 135–145)
Total Bilirubin: 0.8 mg/dL (ref 0.3–1.2)
Total Protein: 7.4 g/dL (ref 6.5–8.1)

## 2021-11-14 LAB — D-DIMER, QUANTITATIVE: D-Dimer, Quant: 1.76 ug/mL-FEU — ABNORMAL HIGH (ref 0.00–0.50)

## 2021-11-14 LAB — MAGNESIUM: Magnesium: 1.6 mg/dL — ABNORMAL LOW (ref 1.7–2.4)

## 2021-11-14 LAB — C-REACTIVE PROTEIN: CRP: 4.5 mg/dL — ABNORMAL HIGH (ref <1.0)

## 2021-11-14 LAB — PHOSPHORUS: Phosphorus: 3 mg/dL (ref 2.5–4.6)

## 2021-11-14 MED ORDER — MELATONIN 3 MG PO TABS
3.0000 mg | ORAL_TABLET | Freq: Every day | ORAL | Status: DC
  Administered 2021-11-14 – 2021-11-15 (×2): 3 mg via ORAL
  Filled 2021-11-14 (×2): qty 1

## 2021-11-14 MED ORDER — APIXABAN 5 MG PO TABS
5.0000 mg | ORAL_TABLET | Freq: Two times a day (BID) | ORAL | Status: DC
  Administered 2021-11-14 – 2021-11-16 (×5): 5 mg via ORAL
  Filled 2021-11-14 (×5): qty 1

## 2021-11-14 MED ORDER — INSULIN GLARGINE-YFGN 100 UNIT/ML ~~LOC~~ SOLN
10.0000 [IU] | Freq: Every day | SUBCUTANEOUS | Status: DC
  Administered 2021-11-14: 10 [IU] via SUBCUTANEOUS
  Filled 2021-11-14 (×2): qty 0.1

## 2021-11-14 MED ORDER — CARVEDILOL 6.25 MG PO TABS
6.2500 mg | ORAL_TABLET | Freq: Two times a day (BID) | ORAL | Status: DC
  Administered 2021-11-14: 6.25 mg via ORAL
  Filled 2021-11-14 (×2): qty 1

## 2021-11-14 MED ORDER — FUROSEMIDE 10 MG/ML IJ SOLN
40.0000 mg | Freq: Once | INTRAMUSCULAR | Status: AC
  Administered 2021-11-14: 40 mg via INTRAVENOUS
  Filled 2021-11-14: qty 4

## 2021-11-14 MED ORDER — MAGNESIUM SULFATE 50 % IJ SOLN
3.0000 g | Freq: Once | INTRAVENOUS | Status: AC
  Administered 2021-11-14: 3 g via INTRAVENOUS
  Filled 2021-11-14: qty 6

## 2021-11-14 NOTE — Progress Notes (Signed)
Progress Note  Patient Name: Anthony Mueller Date of Encounter: 11/14/2021  Primary Cardiologist: [place] Lives in [place], eventually wishes to follow up in [place]   Discussed with patient and wife.  Doing well Is using IS and is presently sating 94% off O2. No further puses  Inpatient Medications    Scheduled Meds:  albuterol  2 puff Inhalation Q6H   allopurinol  100 mg Oral q AM   vitamin C  500 mg Oral Daily   aspirin EC  81 mg Oral QPM   carvedilol  6.25 mg Oral BID WC   cloNIDine  0.2 mg Oral Daily   enoxaparin (LOVENOX) injection  40 mg Subcutaneous Daily   felodipine  10 mg Oral Daily   insulin aspart  0-15 Units Subcutaneous TID WC   insulin aspart  0-5 Units Subcutaneous QHS   insulin glargine-yfgn  7 Units Subcutaneous QHS   linagliptin  5 mg Oral Daily   melatonin  3 mg Oral QHS   methylPREDNISolone (SOLU-MEDROL) injection  40 mg Intravenous Q24H   pantoprazole  40 mg Oral Daily   sodium chloride flush  3 mL Intravenous Q12H   tamsulosin  0.4 mg Oral Daily   timolol  1 drop Both Eyes Daily   zinc sulfate  220 mg Oral Daily   Continuous Infusions:  cefTRIAXone (ROCEPHIN)  IV 2 g (11/13/21 0943)   magnesium sulfate LVP 250-500 ml 3 g (11/14/21 0941)   PRN Meds: guaiFENesin-dextromethorphan, hydrALAZINE, ondansetron **OR** ondansetron (ZOFRAN) IV   Vital Signs    Vitals:   11/13/21 2017 11/14/21 0418 11/14/21 0615 11/14/21 0757  BP: (!) 155/93 (!) 174/114 (!) 162/91 (!) 183/106  Pulse: 81   83  Resp: 17   18  Temp: 98.8 F (37.1 C)   97.8 F (36.6 C)  TempSrc: Oral   Oral  SpO2: 97% 97%  96%  Weight:      Height:        Intake/Output Summary (Last 24 hours) at 11/14/2021 1123 Last data filed at 11/13/2021 1745 Gross per 24 hour  Intake --  Output 400 ml  Net -400 ml   Filed Weights   11/10/21 0755  Weight: 113.4 kg    Telemetry    AF - Personally Reviewed  ECG    AF - Personally Reviewed  Physical Exam    Gen: no distress  Neck: No JVD,  Ears: bilateral Frank Sign Cardiac: No Rubs or Gallops, no Murmur, IRIR Respiratory: Decreased breath sounds GI: Soft, nontender, non-distended  MS: No  edema; BUE strength in tact Integument: Skin feels warm Neuro:  At time of evaluation, alert and oriented to person/place/time/situation  Psych: Normal affect, patient feels well   Labs    Chemistry Recent Labs  Lab 11/12/21 0226 11/13/21 0149 11/14/21 0231  NA 138 138 138  K 3.9 3.7 4.2  CL 97* 99 100  CO2 29 28 30   GLUCOSE 187* 190* 167*  BUN 22 22 15   CREATININE 1.45* 1.13 1.07  CALCIUM 8.1* 8.4* 8.6*  PROT 6.6 6.4* 7.4  ALBUMIN 2.8* 2.7* 2.9*  AST 51* 39 42*  ALT 116* 104* 112*  ALKPHOS 69 66 76  BILITOT 0.8 0.8 0.8  GFRNONAA 52* >60 >60  ANIONGAP 12 11 8      Hematology Recent Labs  Lab 11/12/21 0226 11/13/21 0149 11/14/21 0231  WBC 17.8* 10.7* 12.6*  RBC 3.24* 3.37* 3.89*  HGB 9.4* 9.9* 11.0*  HCT 28.9* 29.6* 34.2*  MCV 89.2 87.8 87.9  MCH 29.0 29.4 28.3  MCHC 32.5 33.4 32.2  RDW 14.9 14.7 14.7  PLT 226 240 325    Cardiac EnzymesNo results for input(s): TROPONINI in the last 168 hours. No results for input(s): TROPIPOC in the last 168 hours.   BNP Recent Labs  Lab 11/10/21 1214  BNP 426.9*     DDimer  Recent Labs  Lab 11/12/21 0226 11/13/21 0149 11/14/21 0231  DDIMER 2.20* 1.76* 1.76*     Radiology    No results found.  Cardiac Studies   EF persevered no MVP on last echo  1. Left ventricular ejection fraction, by estimation, is 55 to 60%. The  left ventricle has normal function. The left ventricle has no regional  wall motion abnormalities. There is mild left ventricular hypertrophy.  Left ventricular diastolic parameters  were normal.   2. Right ventricular systolic function is normal. The right ventricular  size is normal. There is mildly elevated pulmonary artery systolic  pressure. The estimated right ventricular systolic pressure is  96.2 mmHg.   3. Left atrial size was moderately dilated.   4. Right atrial size was mildly dilated.   5. The mitral valve is normal in structure. Mild mitral valve  regurgitation. No evidence of mitral stenosis.   6. The aortic valve is tricuspid. Aortic valve regurgitation is trivial.  Aortic valve sclerosis/calcification is present, without any evidence of  aortic stenosis.   7. The inferior vena cava is dilated in size with <50% respiratory  variability, suggesting right atrial pressure of 15 mmHg.   Patient Profile     71 y.o. male with HTN with DM and HLD with DM, Cerebellar degeneration who is being seen 11/13/2021 for the evaluation of bradycardia found to have AF  Assessment & Plan    PAF duration unknown (asymptomatic) CHASDVASC is 3 HTN AF pause 11/13/21 COVID-19 with persistent hypoxic respiratory failure - I do not suspect there is plan for procedural intervention for transaminitis, will confirm with Dr. Tana Coast and start DOAC - this can be stopped prior to his upcoming rectal surgery (5 days prior then resumed when OK by surgery - clonidine has been returned, will start low dose coreg 6.25 mg PO BID today - multi-factorial SOB, received additional  IV lasix X1 - outpatient LDL therapy based on liver enzymes as he has aortic atherosclerosis and CAC  IF BP control, potential SO 11/15/21  For questions or updates, please contact [facility] Please consult [email or web address] for contact info under Cardiology/STEMI.      Signed, Werner Lean, MD  11/14/2021, 11:23 AM

## 2021-11-14 NOTE — Plan of Care (Signed)
°  Problem: Education: Goal: Knowledge of risk factors and measures for prevention of condition will improve Outcome: Progressing   Problem: Coping: Goal: Psychosocial and spiritual needs will be supported Outcome: Progressing   Problem: Respiratory: Goal: Will maintain a patent airway Outcome: Progressing   Problem: Education: Goal: Knowledge of General Education information will improve Description: Including pain rating scale, medication(s)/side effects and non-pharmacologic comfort measures Outcome: Progressing   Problem: Clinical Measurements: Goal: Ability to maintain clinical measurements within normal limits will improve Outcome: Progressing Goal: Will remain free from infection Outcome: Progressing Goal: Diagnostic test results will improve Outcome: Progressing   Problem: Activity: Goal: Risk for activity intolerance will decrease Outcome: Progressing   Problem: Nutrition: Goal: Adequate nutrition will be maintained Outcome: Progressing   Problem: Coping: Goal: Level of anxiety will decrease Outcome: Progressing   Problem: Elimination: Goal: Will not experience complications related to bowel motility Outcome: Progressing   Problem: Pain Managment: Goal: General experience of comfort will improve Outcome: Progressing   Problem: Safety: Goal: Ability to remain free from injury will improve Outcome: Progressing   Problem: Skin Integrity: Goal: Risk for impaired skin integrity will decrease Outcome: Progressing

## 2021-11-14 NOTE — Progress Notes (Signed)
Progress Note   Patient: Anthony Mueller [id number]:[id number] DOB: 1951/05/12 DOA: 11/10/2021     4 DOS: the patient was seen and examined on 11/14/2021   Brief hospital course: Patient is a 71 year old male with hypertension, hyperlipidemia, diabetes mellitus type 2, OSA on CPAP, and cerebellar degeneration who is wheelchair-bound at baseline presented with shortness of breath, fevers, chills, worsening cough.  Also reported nausea vomiting diarrhea, dizziness, generalized weakness.  Febrile at the time of admission 101.6.  Currently, pulse 95 200, BP 174/77.  Hypoxia with O2 sats 88% on room air. Patient had tested positive for COVID on 12/25 and had completed 5-day course of Paxlovid.  Also noted to have transaminitis, concern for cholecystitis on abdominal ultrasound and MRCP.  GI, general surgery consulted.  No plans for surgical intervention, recommended outpatient follow-up 1/14: Noted to have atrial fibrillation with bradycardia, sinus pauses.  Cardiology consulted Started on apixaban     Assessment and Plan * Acute respiratory failure with hypoxia ([place])- (present on admission) - Presented with hypoxia, O2 sats 88% on room air, leukocytosis, shortness of breath, nonproductive cough, fevers.  Patient is vaccinated and had boosters for COVID.  Chest x-ray showed moderate cardiac enlargement, increased from previous x-rays, large hiatal hernia.  UA negative for UTI. -Tested COVID-19 positive on 12/25, has completed 5-day course of Paxlovid outpatient. -Received Lasix 40 mg IV x1 on 1/14, states had a good output and feels better, negative balance of 242.9cc - will repeat Lasix 40 mg IV x1 again today, continue IV Solu-Medrol  Unspecified atrial fibrillation (HCC) with sinus pauses- (present on admission) - Patient noted to be in atrial fibrillation, bradycardia with sinus pauses -Appreciate cardiology recommendation, resuming Coreg 6.25 mg twice daily today, clonidine had to be resumed due to  rebound elevated BP readings -Per cardiology, started on apixaban, will need to be stopped prior to his upcoming rectal surgery (stop 5 days prior to the surgery and then resume once cleared by surgery service).  Transaminitis- (present on admission) - Possibly due to COVID-19 and paxlovid.  - LFTs improving - Appreciate GI and general surgery recommendations  COVID-19 virus infection- (present on admission) - Patient tested positive with COVID-19 on 12/25, has completed 5-day course of Paxlovid.  -CTA negative for acute PE, venous Dopplers negative for DVT. -Feels mildly improving today, will do home O2 evaluation, repeat Lasix 40 mg IV x1 again, continue IV Solu-Medrol    Cholelithiasis- (present on admission) - No acute abdominal pain, has transaminitis.  Abd US showed possible obstruction and thickening of gallbladder wall to suggest chronic cholecystitis -MRCP showed cholelithiasis, mild gallbladder distention and borderline mild diffuse gallbladder wall thickening, findings equivocal for acute cholecystitis.  No biliary duct dilatation - No plan for surgical intervention at this time. -Tolerating advanced diet, outpatient follow-up with GI and surgery  Nausea vomiting and diarrhea- (present on admission) - Likely due to possible rebound COVID-19 symptoms, improving -Improved, tolerating diet, continue supportive treatment  Hypokalemia- (present on admission) - Resolved  Type 2 diabetes mellitus with hyperlipidemia ([place])- (present on admission) - NIDDM, on Tradjenta and metformin outpatient -Hemoglobin A1c 7.3. -CBGs elevated due to steroids, continue NovoLog SSI, moderate, increase Semglee 10 units daily, Tradjenta  Recent Labs    11/13/21 0748 11/13/21 1134 11/13/21 1600 11/13/21 2122 11/14/21 0654 11/14/21 1131  GLUCAP 229* 132* 221* 254* 131* 271*       Cardiomegaly- (present on admission) - 2D echo showed EF of 55 to 60%, no regional WMA -Lasix 40 mg IV  x1  again today, reassess in a.m.  GERD (gastroesophageal reflux disease)- (present on admission) Continue PPI  Cerebellar degeneration ([place]) - History of cerebellar degeneration, wheelchair-bound at baseline    Subjective: Feeling somewhat better had good output after IV Lasix.  No chest pain, dizziness, lightheadedness.  Wife at the bedside  Objective Vitals:   11/14/21 0615 11/14/21 0757  BP: (!) 162/91 (!) 183/106  Pulse:  83  Resp:  18  Temp:  97.8 F (36.6 C)  SpO2:  96%    Physical Exam General: Alert and oriented x 3, NAD Cardiovascular: S1 S2 clear, RRR. No pedal edema b/l Respiratory: Decreased breath sounds but no coarse rhonchi or wheezing Gastrointestinal: Soft, nontender, nondistended, NBS Ext: no pedal edema bilaterally   Data Reviewed:  CBC Latest Ref Rng & Units 11/14/2021 11/13/2021 11/12/2021  WBC 4.0 - 10.5 K/uL 12.6(H) 10.7(H) 17.8(H)  Hemoglobin 13.0 - 17.0 g/dL 11.0(L) 9.9(L) 9.4(L)  Hematocrit 39.0 - 52.0 % 34.2(L) 29.6(L) 28.9(L)  Platelets 150 - 400 K/uL 325 240 226    BMP Latest Ref Rng & Units 11/14/2021 11/13/2021 11/12/2021  Glucose 70 - 99 mg/dL 167(H) 190(H) 187(H)  BUN 8 - 23 mg/dL 15 22 22   Creatinine 0.61 - 1.24 mg/dL 1.07 1.13 1.45(H)  Sodium 135 - 145 mmol/L 138 138 138  Potassium 3.5 - 5.1 mmol/L 4.2 3.7 3.9  Chloride 98 - 111 mmol/L 100 99 97(L)  CO2 22 - 32 mmol/L 30 28 29   Calcium 8.9 - 10.3 mg/dL 8.6(L) 8.4(L) 8.1(L)    Family Communication: Updated patient's wife at the bedside  Disposition: Status is: Inpatient  Remains inpatient appropriate because: Hopefully DC home in next 24 to 48 hours.  Pending home O2 evaluation.  DVT prophylaxis: Apixaban   Time spent: 37 minutes  Author: Estill Cotta, MD 11/14/2021 12:23 PM  For on call review [email or web address].

## 2021-11-14 NOTE — Progress Notes (Signed)
ANTICOAGULATION CONSULT NOTE - Initial Consult  Pharmacy Consult for apixaban  Indication: atrial fibrillation  Allergies  Allergen Reactions   Liraglutide     Other reaction(s): Other (See Comments) CAN NOT TAKE BECAUSE A SIDE EFFECT IS PANCREATITIS AND HE HAS A HISTORY Contraindicated due to pancreatitis    Hydrocodone     Other reaction(s): Other (See Comments) MAKES FEEL SPACEY AND LIGHT HEADED   Hydrocodone-Acetaminophen     Other reaction(s): Other (See Comments) Feels " orbity"   Other     Other reaction(s): Other (See Comments) BANDAID  CAUSES RASH   Tape Other (See Comments)    Skin irritation, welts   Latex Rash    Band-aids   Metronidazole Nausea And Vomiting    Other reaction(s): GI Upset (intolerance)  AND DEPRESSION ;  TABLETS Does fine w/ IV Flagyl     Patient Measurements: Height: 6\' 1"  (185.4 cm) Weight: 113.4 kg (250 lb) IBW/kg (Calculated) : 79.9 Heparin Dosing Weight:   Vital Signs: Temp: 97.8 F (36.6 C) (01/15 0757) Temp Source: Oral (01/15 0757) BP: 183/106 (01/15 0757) Pulse Rate: 83 (01/15 0757)  Labs: Recent Labs    11/12/21 0226 11/13/21 0149 11/14/21 0231  HGB 9.4* 9.9* 11.0*  HCT 28.9* 29.6* 34.2*  PLT 226 240 325  CREATININE 1.45* 1.13 1.07    Estimated Creatinine Clearance: 83.6 mL/min (by C-G formula based on SCr of 1.07 mg/dL).   Medical History: Past Medical History:  Diagnosis Date   Cerebellar degeneration ([place])    Diabetes mellitus without complication ([place])    Hyperlipidemia    Hypertension     Medications:  Medications Prior to Admission  Medication Sig Dispense Refill Last Dose   allopurinol (ZYLOPRIM) 100 MG tablet Take 100 mg by mouth in the morning.   11/09/2021 at 0930   allopurinol (ZYLOPRIM) 300 MG tablet Take 300 mg by mouth every evening.   Past Week   aspirin EC 81 MG tablet Take 81 mg by mouth every evening. Swallow whole.   Past Week   candesartan (ATACAND) 32 MG tablet Take 32 mg by mouth  daily.   11/09/2021   carvedilol (COREG) 25 MG tablet Take 25 mg by mouth 2 (two) times daily.   11/09/2021 at 0930   cloNIDine (CATAPRES) 0.2 MG tablet Take 0.2 mg by mouth 2 (two) times daily.   11/09/2021   felodipine (PLENDIL) 10 MG 24 hr tablet Take 10 mg by mouth daily.   11/09/2021   Ferrous Sulfate (IRON SLOW RELEASE) 143 (45 Fe) MG TBCR Take 143 mg by mouth every evening.   Past Week   furosemide (LASIX) 20 MG tablet Take 20 mg by mouth daily.   11/09/2021   lovastatin (MEVACOR) 40 MG tablet Take 40 mg by mouth every evening.   Past Week   metFORMIN (GLUCOPHAGE) 500 MG tablet Take 1,000 mg by mouth 2 (two) times daily.   11/09/2021   omeprazole (PRILOSEC) 20 MG capsule Take 20 mg by mouth daily.   Past Week   tamsulosin (FLOMAX) 0.4 MG CAPS capsule Take 0.4 mg by mouth daily.   Past Week   timolol (TIMOPTIC) 0.5 % ophthalmic solution Place 1 drop into both eyes daily.   11/09/2021   TRADJENTA 5 MG TABS tablet Take 5 mg by mouth daily.   11/09/2021   Scheduled:   albuterol  2 puff Inhalation Q6H   allopurinol  100 mg Oral q AM   apixaban  5 mg Oral BID   vitamin  C  500 mg Oral Daily   carvedilol  6.25 mg Oral BID WC   cloNIDine  0.2 mg Oral Daily   felodipine  10 mg Oral Daily   insulin aspart  0-15 Units Subcutaneous TID WC   insulin aspart  0-5 Units Subcutaneous QHS   insulin glargine-yfgn  10 Units Subcutaneous QHS   linagliptin  5 mg Oral Daily   melatonin  3 mg Oral QHS   methylPREDNISolone (SOLU-MEDROL) injection  40 mg Intravenous Q24H   pantoprazole  40 mg Oral Daily   sodium chloride flush  3 mL Intravenous Q12H   tamsulosin  0.4 mg Oral Daily   timolol  1 drop Both Eyes Daily   zinc sulfate  220 mg Oral Daily    Assessment: Pt with new afib. Plan to transition to apixaban today. Age<80, wt>60kg, scr <1.5  Goal of Therapy:   Monitor platelets by anticoagulation protocol: Yes   Plan:  Apixaban 5mg  PO BID Rx will follow peripherally  Onnie Boer, PharmD, BCIDP,  AAHIVP, CPP Infectious Disease Pharmacist 11/14/2021 12:40 PM

## 2021-11-14 NOTE — Progress Notes (Signed)
Placed patient on CPAP previous settings (auto titrate) 21% FIO2.

## 2021-11-15 ENCOUNTER — Other Ambulatory Visit (HOSPITAL_COMMUNITY): Payer: Self-pay

## 2021-11-15 LAB — COMPREHENSIVE METABOLIC PANEL
ALT: 108 U/L — ABNORMAL HIGH (ref 0–44)
AST: 38 U/L (ref 15–41)
Albumin: 2.9 g/dL — ABNORMAL LOW (ref 3.5–5.0)
Alkaline Phosphatase: 66 U/L (ref 38–126)
Anion gap: 9 (ref 5–15)
BUN: 19 mg/dL (ref 8–23)
CO2: 28 mmol/L (ref 22–32)
Calcium: 9.2 mg/dL (ref 8.9–10.3)
Chloride: 101 mmol/L (ref 98–111)
Creatinine, Ser: 1.15 mg/dL (ref 0.61–1.24)
GFR, Estimated: >60 mL/min (ref >60)
Glucose, Bld: 129 mg/dL — ABNORMAL HIGH (ref 70–99)
Potassium: 3.2 mmol/L — ABNORMAL LOW (ref 3.5–5.1)
Sodium: 138 mmol/L (ref 135–145)
Total Bilirubin: 0.7 mg/dL (ref 0.3–1.2)
Total Protein: 6.9 g/dL (ref 6.5–8.1)

## 2021-11-15 LAB — CBC WITH DIFFERENTIAL/PLATELET
Abs Immature Granulocytes: 0.61 10*3/uL — ABNORMAL HIGH (ref 0.00–0.07)
Basophils Absolute: 0.1 10*3/uL (ref 0.0–0.1)
Basophils Relative: 0 %
Eosinophils Absolute: 0.1 10*3/uL (ref 0.0–0.5)
Eosinophils Relative: 0 %
HCT: 35.7 % — ABNORMAL LOW (ref 39.0–52.0)
Hemoglobin: 12 g/dL — ABNORMAL LOW (ref 13.0–17.0)
Immature Granulocytes: 4 %
Lymphocytes Relative: 19 %
Lymphs Abs: 3.1 10*3/uL (ref 0.7–4.0)
MCH: 28.9 pg (ref 26.0–34.0)
MCHC: 33.6 g/dL (ref 30.0–36.0)
MCV: 86 fL (ref 80.0–100.0)
Monocytes Absolute: 1.4 10*3/uL — ABNORMAL HIGH (ref 0.1–1.0)
Monocytes Relative: 9 %
Neutro Abs: 11.1 10*3/uL — ABNORMAL HIGH (ref 1.7–7.7)
Neutrophils Relative %: 68 %
Platelets: 341 10*3/uL (ref 150–400)
RBC: 4.15 MIL/uL — ABNORMAL LOW (ref 4.22–5.81)
RDW: 14.7 % (ref 11.5–15.5)
WBC: 16.2 10*3/uL — ABNORMAL HIGH (ref 4.0–10.5)
nRBC: 0.5 % — ABNORMAL HIGH (ref 0.0–0.2)

## 2021-11-15 LAB — C-REACTIVE PROTEIN: CRP: 1.6 mg/dL — ABNORMAL HIGH (ref <1.0)

## 2021-11-15 LAB — CULTURE, BLOOD (ROUTINE X 2)
Culture: NO GROWTH
Culture: NO GROWTH
Special Requests: ADEQUATE

## 2021-11-15 LAB — PHOSPHORUS: Phosphorus: 3.8 mg/dL (ref 2.5–4.6)

## 2021-11-15 LAB — MAGNESIUM: Magnesium: 1.7 mg/dL (ref 1.7–2.4)

## 2021-11-15 LAB — D-DIMER, QUANTITATIVE: D-Dimer, Quant: 1.22 ug/mL-FEU — ABNORMAL HIGH (ref 0.00–0.50)

## 2021-11-15 LAB — GLUCOSE, CAPILLARY
Glucose-Capillary: 238 mg/dL — ABNORMAL HIGH (ref 70–99)
Glucose-Capillary: 229 mg/dL — ABNORMAL HIGH (ref 70–99)
Glucose-Capillary: 230 mg/dL — ABNORMAL HIGH (ref 70–99)
Glucose-Capillary: 320 mg/dL — ABNORMAL HIGH (ref 70–99)

## 2021-11-15 MED ORDER — PREDNISONE 20 MG PO TABS
40.0000 mg | ORAL_TABLET | Freq: Every day | ORAL | Status: DC
  Administered 2021-11-15 – 2021-11-16 (×2): 40 mg via ORAL
  Filled 2021-11-15 (×2): qty 2

## 2021-11-15 MED ORDER — POTASSIUM CHLORIDE CRYS ER 20 MEQ PO TBCR
40.0000 meq | EXTENDED_RELEASE_TABLET | Freq: Once | ORAL | Status: AC
  Administered 2021-11-15: 40 meq via ORAL
  Filled 2021-11-15: qty 2

## 2021-11-15 MED ORDER — IRBESARTAN 300 MG PO TABS
300.0000 mg | ORAL_TABLET | Freq: Every day | ORAL | Status: DC
  Administered 2021-11-15 – 2021-11-16 (×2): 300 mg via ORAL
  Filled 2021-11-15 (×2): qty 1

## 2021-11-15 MED ORDER — ALBUTEROL SULFATE HFA 108 (90 BASE) MCG/ACT IN AERS
2.0000 | INHALATION_SPRAY | Freq: Four times a day (QID) | RESPIRATORY_TRACT | Status: DC | PRN
  Filled 2021-11-15: qty 6.7

## 2021-11-15 MED ORDER — INSULIN GLARGINE-YFGN 100 UNIT/ML ~~LOC~~ SOLN
10.0000 [IU] | Freq: Two times a day (BID) | SUBCUTANEOUS | Status: DC
  Administered 2021-11-15 – 2021-11-16 (×2): 10 [IU] via SUBCUTANEOUS
  Filled 2021-11-15 (×3): qty 0.1

## 2021-11-15 MED ORDER — FUROSEMIDE 20 MG PO TABS
20.0000 mg | ORAL_TABLET | Freq: Every day | ORAL | Status: DC
  Administered 2021-11-15 – 2021-11-16 (×2): 20 mg via ORAL
  Filled 2021-11-15 (×2): qty 1

## 2021-11-15 MED ORDER — CARVEDILOL 12.5 MG PO TABS
12.5000 mg | ORAL_TABLET | Freq: Two times a day (BID) | ORAL | Status: DC
  Administered 2021-11-15 – 2021-11-16 (×2): 12.5 mg via ORAL
  Filled 2021-11-15 (×2): qty 1

## 2021-11-15 NOTE — TOC Initial Note (Addendum)
Transition of Care [facility]) - Initial/Assessment Note    Patient Details  Name: Anthony Mueller MRN: [id number] Date of Birth: 1951-07-02  Transition of Care [facility]) CM/SW Contact:    Bartholomew Crews, RN Phone Number: [phone] 11/15/2021, 10:41 AM  Clinical Narrative:                  Spoke with patient's spouse, Pamala Hurry, on hospital phone to discuss post acute transition. Pamala Hurry stated that patient has a degenerative cerebellar condition and he won't get any better and has been wheelchair bound for awhile. Pamala Hurry stated that she would especially appreciate HH to do home evaluation and make recommendations for how she can make the home more accessible for patient especially in the bathroom. Discussed choice of agencies. [facility] accepted referral - patient will need HH orders for PT and OT with Face to Face.  Pamala Hurry stated that she anticipates patient will discharge either later today or tomorrow. TOC following for transition needs.   Expected Discharge Plan: [place] Barriers to Discharge: Continued Medical Work up   Patient Goals and CMS Choice Patient states their goals for this hospitalization and ongoing recovery are:: return home CMS Medicare.gov Compare Post Acute Care list provided to:: Patient Represenative (must comment) Pamala Hurry (wife)) Choice offered to / list presented to : Spouse  Expected Discharge Plan and Services Expected Discharge Plan: [place] In-house Referral: NA Discharge Planning Services: CM Consult Post Acute Care Choice: [place] arrangements for the past 2 months: Single Family Home                 DME Arranged: N/A DME Agency: NA       HH Arranged: PT, OT HH Agency: [facility] Date [facility] Agency Contacted: 11/15/21 Time HH Agency Contacted: 70 Representative spoke with at [place]: Tommi Rumps  Prior Living Arrangements/Services Living arrangements for the past 2 months: [place]  with:: Spouse, Self Patient language and need for interpreter reviewed:: Yes Do you feel safe going back to the place where you live?: Yes      Need for Family Participation in Patient Care: Yes (Comment) Care giver support system in place?: Yes (comment) Current home services: DME (wheelchair) Criminal Activity/Legal Involvement Pertinent to Current Situation/Hospitalization: No - Comment as needed  Activities of Daily Living Home Assistive Devices/Equipment: CPAP, Wheelchair ADL Screening (condition at time of admission) Patient's cognitive ability adequate to safely complete daily activities?: Yes Is the patient deaf or have difficulty hearing?: No Does the patient have difficulty seeing, even when wearing glasses/contacts?: No Does the patient have difficulty concentrating, remembering, or making decisions?: No Patient able to express need for assistance with ADLs?: Yes Does the patient have difficulty dressing or bathing?: No Independently performs ADLs?: No Communication: Independent Dressing (OT): Needs assistance Is this a change from baseline?: Pre-admission baseline Grooming: Independent with device (comment) Feeding: Independent Bathing: Needs assistance Is this a change from baseline?: Pre-admission baseline Toileting: Needs assistance Is this a change from baseline?: Pre-admission baseline In/Out Bed: Needs assistance Is this a change from baseline?: Pre-admission baseline Does the patient have difficulty walking or climbing stairs?: No Weakness of Legs: Both Weakness of Arms/Hands: Both  Permission Sought/Granted   Permission granted to share information with : Yes, Verbal Permission Granted  Share Information with NAME: KORBAN SHEARER (Spouse)  [phone]           Emotional Assessment Appearance:: Appears stated age Attitude/Demeanor/Rapport: Engaged Affect (  typically observed): Accepting Orientation: : Oriented to Self, Oriented to Place, Oriented to   Time, Oriented to Situation Alcohol / Substance Use: Not Applicable Psych Involvement: No (comment)  Admission diagnosis:  Hypoxia [R09.02] Elevated LFTs [R79.89] Acute respiratory failure with hypoxia ([place]) [J96.01] COVID-19 virus infection [U07.1] Patient Active Problem List   Diagnosis Date Noted   Unspecified atrial fibrillation ([place]) with sinus pauses 11/13/2021   Acute respiratory failure with hypoxia ([place]) 11/10/2021   Cardiomegaly 11/10/2021   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

## 2021-11-15 NOTE — Progress Notes (Signed)
Physical Therapy Treatment Patient Details Name: Anthony Mueller MRN: [id number] DOB: 01-14-1951 Today's Date: 11/15/2021   History of Present Illness 71 yo male presents to [facility] on 1/11, covid + as of 12/25 with x5 day course of paxlovid but had resumption of symptoms 1/9. 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    PT Comments    Received pt semi-reclined in recliner with daughter present at bedside. Session with focus on blocked practice sit<>stands with RW and monitoring HR/O2 sats. Per pt/daughter, pt was not ambulating at home, strictly performing stand<>pivots to/from his WC. Pt stood x3 trials from recliner with heavy min/mod A and able to stand ~1 minute prior to sitting due to c/o high HR and low O2 sats. Pt required multiple attempts and momentum to stand from recliner and upon standing had 2 posterior LOB into recliner - pt also demonstrates poor eccentric control upon sitting. Pt would benefit from HHPT to continue to address strength, endurance, and balance deficits. Acute PT to cont to follow.     Recommendations for follow up therapy are one component of a multi-disciplinary discharge planning process, led by the attending physician.  Recommendations may be updated based on patient status, additional functional criteria and insurance authorization.  Follow Up Recommendations  Home health PT     Assistance Recommended at Discharge Frequent or constant Supervision/Assistance  Patient can return home with the following A little help with walking and/or transfers;Assist for transportation;A little help with bathing/dressing/bathroom   Equipment Recommendations  None recommended by PT    Recommendations for Other Services       Precautions / Restrictions Precautions Precautions: Fall Restrictions Weight Bearing Restrictions: No     Mobility  Bed Mobility                  Patient Response: Cooperative  Transfers Overall transfer level: Needs assistance Equipment used: Rolling walker (2 wheels) Transfers: Sit to/from Stand Sit to Stand: Min assist;Mod assist           General transfer comment: pt required multiple attempts and momentum to perform 3 stands from recliner with heavy BUE support on RW. Pt unsteady upon standing with 2 posterior LOB backwards into recliner.    Ambulation/Gait                   Stairs             Wheelchair Mobility    Modified Rankin (Stroke Patients Only)       Balance Overall balance assessment: Needs assistance;History of Falls Sitting-balance support: Bilateral upper extremity supported;Feet supported Sitting balance-Leahy Scale: Poor Sitting balance - Comments: able to scoot to edge of recliner and maintain balance without assist   Standing balance support: Bilateral upper extremity supported (RW) Standing balance-Leahy Scale: Poor Standing balance comment: heavy reliance on BUE support on RW and min A for static standing balance. Pt able to remain standing ~1 minute prior to sitting due to HR reaching 147bpm and O2 sat dropping to 78%.                            Cognition Arousal/Alertness: Awake/alert Behavior During Therapy: WFL for tasks assessed/performed Overall Cognitive Status: Within Functional Limits for tasks assessed  General Comments: daughter present at bedside during session        Exercises      General Comments General comments (skin integrity, edema, etc.): O2 dropped to 78% and HR reached 147bpm when standing - pt asymptomatic. With seated rest HR dropped <100bpm and O2 sat increased to >91%      Pertinent Vitals/Pain Pain Assessment: No/denies pain    Home Living                          Prior Function            PT Goals (current goals can now be found in the care plan section) Acute  Rehab PT Goals Patient Stated Goal: home PT Goal Formulation: With patient/family Time For Goal Achievement: 11/25/70 Potential to Achieve Goals: Good Progress towards PT goals: Progressing toward goals    Frequency    Min 3X/week      PT Plan Current plan remains appropriate    Co-evaluation              AM-PAC PT "6 Clicks" Mobility   Outcome Measure  Help needed turning from your back to your side while in a flat bed without using bedrails?: A Little Help needed moving from lying on your back to sitting on the side of a flat bed without using bedrails?: A Little Help needed moving to and from a bed to a chair (including a wheelchair)?: A Little Help needed standing up from a chair using your arms (e.g., wheelchair or bedside chair)?: A Little Help needed to walk in hospital room?: Total Help needed climbing 3-5 steps with a railing? : Total 6 Click Score: 14    End of Session   Activity Tolerance: Patient tolerated treatment well Patient left: in chair;with call bell/phone within reach;with family/visitor present Nurse Communication: Mobility status PT Visit Diagnosis: Other abnormalities of gait and mobility (R26.89);Muscle weakness (generalized) (M62.81);Unsteadiness on feet (R26.81);History of falling (Z91.81)     Time: 9169-4503 PT Time Calculation (min) (ACUTE ONLY): 21 min  Charges:  $Therapeutic Activity: 8-22 mins                    Becky Sax PT, DPT  Blenda Nicely 11/15/2021, 11:38 AM

## 2021-11-15 NOTE — Progress Notes (Signed)
Progress Note  Patient Name: Anthony Mueller Date of Encounter: 11/15/2021  Primary Cardiologist: [place] Lives in [place], eventually wishes to follow up in [place]  Subjective   No further pauses. Off O2. Asymptomatic. BP still elevated. HR elevated when transferring or when talking about his potential outpatient surgeries (non cardiac)  Inpatient Medications    Scheduled Meds:  albuterol  2 puff Inhalation Q6H   allopurinol  100 mg Oral q AM   apixaban  5 mg Oral BID   vitamin C  500 mg Oral Daily   carvedilol  12.5 mg Oral BID WC   cloNIDine  0.2 mg Oral Daily   felodipine  10 mg Oral Daily   insulin aspart  0-15 Units Subcutaneous TID WC   insulin aspart  0-5 Units Subcutaneous QHS   insulin glargine-yfgn  10 Units Subcutaneous QHS   irbesartan  300 mg Oral Daily   linagliptin  5 mg Oral Daily   melatonin  3 mg Oral QHS   methylPREDNISolone (SOLU-MEDROL) injection  40 mg Intravenous Q24H   pantoprazole  40 mg Oral Daily   sodium chloride flush  3 mL Intravenous Q12H   tamsulosin  0.4 mg Oral Daily   timolol  1 drop Both Eyes Daily   zinc sulfate  220 mg Oral Daily   Continuous Infusions:   PRN Meds: guaiFENesin-dextromethorphan, hydrALAZINE, ondansetron **OR** ondansetron (ZOFRAN) IV   Vital Signs    Vitals:   11/15/21 0549 11/15/21 0717 11/15/21 0828 11/15/21 0850  BP: (!) 175/116  (!) 148/103 (!) 163/125  Pulse: 86  99 93  Resp: 20  18 18   Temp: 97.8 F (36.6 C)  97.6 F (36.4 C)   TempSrc: Oral  Oral   SpO2: 99% 100% 94% 100%  Weight:      Height:        Intake/Output Summary (Last 24 hours) at 11/15/2021 0859 Last data filed at 11/15/2021 0300 Gross per 24 hour  Intake 785 ml  Output 1250 ml  Net -465 ml   Filed Weights   11/10/21 0755  Weight: 113.4 kg    Telemetry    AF rates 90s to 120s - Personally Reviewed  ECG    AF - Personally Reviewed  Physical Exam   Gen: no distress  Neck: No JVD,  Ears: bilateral  Frank Sign Cardiac: No Rubs or Gallops, no Murmur, IRIR tachycardia Respiratory: Decreased breath sounds GI: Soft, nontender, non-distended  MS: No  edema; BUE strength in tact Integument: Skin feels warm Neuro:  At time of evaluation, alert and oriented to person/place/time/situation  Psych: Normal affect, patient feels well   Labs    Chemistry Recent Labs  Lab 11/13/21 0149 11/14/21 0231 11/15/21 0445  NA 138 138 138  K 3.7 4.2 3.2*  CL 99 100 101  CO2 28 30 28   GLUCOSE 190* 167* 129*  BUN 22 15 19   CREATININE 1.13 1.07 1.15  CALCIUM 8.4* 8.6* 9.2  PROT 6.4* 7.4 6.9  ALBUMIN 2.7* 2.9* 2.9*  AST 39 42* 38  ALT 104* 112* 108*  ALKPHOS 66 76 66  BILITOT 0.8 0.8 0.7  GFRNONAA >60 >60 >60  ANIONGAP 11 8 9      Hematology Recent Labs  Lab 11/13/21 0149 11/14/21 0231 11/15/21 0445  WBC 10.7* 12.6* 16.2*  RBC 3.37* 3.89* 4.15*  HGB 9.9* 11.0* 12.0*  HCT 29.6* 34.2* 35.7*  MCV 87.8 87.9 86.0  MCH 29.4 28.3 28.9  MCHC 33.4 32.2 33.6  RDW 14.7 14.7 14.7  PLT 240 325 341    Cardiac EnzymesNo results for input(s): TROPONINI in the last 168 hours. No results for input(s): TROPIPOC in the last 168 hours.   BNP Recent Labs  Lab 11/10/21 1214  BNP 426.9*     DDimer  Recent Labs  Lab 11/13/21 0149 11/14/21 0231 11/15/21 0445  DDIMER 1.76* 1.76* 1.22*     Radiology    No results found.  Cardiac Studies   EF persevered no MVP on last echo  1. Left ventricular ejection fraction, by estimation, is 55 to 60%. The  left ventricle has normal function. The left ventricle has no regional  wall motion abnormalities. There is mild left ventricular hypertrophy.  Left ventricular diastolic parameters  were normal.   2. Right ventricular systolic function is normal. The right ventricular  size is normal. There is mildly elevated pulmonary artery systolic  pressure. The estimated right ventricular systolic pressure is 88.2 mmHg.   3. Left atrial size was  moderately dilated.   4. Right atrial size was mildly dilated.   5. The mitral valve is normal in structure. Mild mitral valve  regurgitation. No evidence of mitral stenosis.   6. The aortic valve is tricuspid. Aortic valve regurgitation is trivial.  Aortic valve sclerosis/calcification is present, without any evidence of  aortic stenosis.   7. The inferior vena cava is dilated in size with <50% respiratory  variability, suggesting right atrial pressure of 15 mmHg.   Patient Profile     71 y.o. male with HTN with DM and HLD with DM, Cerebellar degeneration who is being seen 11/13/2021 for the evaluation of bradycardia found to have AF  Assessment & Plan    PAF duration unknown (asymptomatic) CHASDVASC is 3 HTN AF pause 11/13/21 (asymptomatic < 5 s X 1) COVID-19 with persistent hypoxic respiratory failure - eliquis - this can be stopped prior to his upcoming rectal surgery (5 days prior then resumed when OK by surgery - back on clonidine, will increase coreg to 12.5 mg PO BID - returned patient to home candesartan equivalent - outpatient LDL therapy based on liver enzymes as he has aortic atherosclerosis and CAC  Working on [employer] f/u  For questions or updates, please contact [facility] Please consult [email or web address] for contact info under Cardiology/STEMI.      Signed, Werner Lean, MD  11/15/2021, 8:59 AM

## 2021-11-15 NOTE — Progress Notes (Signed)
Progress Note   Patient: Anthony Mueller WUX:[id number] DOB: December 26, 1950 DOA: 11/10/2021     5 DOS: the patient was seen and examined on 11/15/2021   Brief hospital course: Patient is a 71 year old male with hypertension, hyperlipidemia, diabetes mellitus type 2, OSA on CPAP, and cerebellar degeneration who is wheelchair-bound at baseline presented with shortness of breath, fevers, chills, worsening cough.  Also reported nausea vomiting diarrhea, dizziness, generalized weakness.  Febrile at the time of admission 101.6.  Currently, pulse 95 200, BP 174/77.  Hypoxia with O2 sats 88% on room air. Patient had tested positive for COVID on 12/25 and had completed 5-day course of Paxlovid.  Also noted to have transaminitis, concern for cholecystitis on abdominal ultrasound and MRCP.  GI, general surgery consulted.  No plans for surgical intervention, recommended outpatient follow-up 1/14: Noted to have atrial fibrillation with bradycardia, sinus pauses.  Cardiology consulted Started on apixaban      Assessment and Plan * Acute respiratory failure with hypoxia ([place])- (present on admission) - Presented with hypoxia, O2 sats 88% on room air, leukocytosis, shortness of breath, nonproductive cough, fevers.  Patient is vaccinated and had boosters for COVID.  Chest x-ray showed moderate cardiac enlargement, increased from previous x-rays, large hiatal hernia.  UA negative for UTI. -Tested COVID-19 positive on 12/25, has completed 5-day course of Paxlovid outpatient. -Received IV Lasix 40 mg on 1/14 and 1/15.  Now off O2, lung sounds clear.  Resume outpatient oral Lasix 20 mg daily -Transitioned to oral prednisone with a taper upon discharge  Unspecified atrial fibrillation (HCC) with sinus pauses- (present on admission) - Patient noted to be in atrial fibrillation, bradycardia with sinus pauses -Per cardiology, started on apixaban, will need to be stopped prior to his upcoming rectal surgery (stop 5 days  prior to the surgery and then resume once cleared by surgery service). -Now having intermittent tachycardia and elevated BP, increased Coreg to 12.5 mg BID, resume ARB  -Heart rate went up to 130s during PT, will monitor today  Transaminitis- (present on admission) - Possibly due to COVID-19 and paxlovid.  - LFTs improving - Appreciate GI and general surgery recommendations  COVID-19 virus infection- (present on admission) - Patient tested positive with COVID-19 on 12/25, has completed 5-day course of Paxlovid.  -CTA negative for acute PE, venous Dopplers negative for DVT. -Feels better now, off O2  Cholelithiasis- (present on admission) - No acute abdominal pain, has transaminitis.  Abd US showed possible obstruction and thickening of gallbladder wall to suggest chronic cholecystitis -MRCP showed cholelithiasis, mild gallbladder distention and borderline mild diffuse gallbladder wall thickening, findings equivocal for acute cholecystitis.  No biliary duct dilatation - No plan for surgical intervention at this time. -Tolerating advanced diet, outpatient follow-up with GI and surgery  Nausea vomiting and diarrhea- (present on admission) - Likely due to possible rebound COVID-19 symptoms, improving -Improved, tolerating diet, continue supportive treatment  Hypokalemia- (present on admission) - Replaced  Type 2 diabetes mellitus with hyperlipidemia ([place])- (present on admission) - NIDDM, on Tradjenta and metformin outpatient -Hemoglobin A1c 7.3. 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  No chest pain.  No fevers  Objective Vitals:   11/15/21 1014 11/15/21 1214  BP: 121/86 129/86  Pulse: 74 96  Resp: 17 16  Temp: 97.7 F (36.5 C) 97.6 F (36.4 C)  SpO2: 100% 100%     Physical Exam General: Alert and oriented x 3, NAD Cardiovascular: S1 S2 clear, RRR. No pedal edema b/l Respiratory: CTAB, no wheezing, rales or rhonchi Gastrointestinal: Soft, nontender, nondistended, NBS Ext: no pedal edema bilaterally Psych: Normal affect and demeanor, alert and oriented x3    Data Reviewed:  CBC Latest Ref Rng & Units 11/15/2021 11/14/2021 11/13/2021  WBC 4.0 - 10.5 K/uL 16.2(H) 12.6(H) 10.7(H)  Hemoglobin 13.0 - 17.0 g/dL 12.0(L) 11.0(L) 9.9(L)  Hematocrit 39.0 - 52.0 % 35.7(L) 34.2(L) 29.6(L)  Platelets 150 - 400 K/uL 341 325 240    BMP Latest Ref Rng & Units 11/15/2021 11/14/2021 11/13/2021  Glucose 70 - 99 mg/dL 129(H) 167(H) 190(H)  BUN 8 - 23 mg/dL 19 15 22   Creatinine 0.61 - 1.24 mg/dL 1.15 1.07 1.13  Sodium 135 - 145 mmol/L 138 138 138  Potassium 3.5 - 5.1 mmol/L 3.2(L) 4.2 3.7  Chloride 98 - 111 mmol/L 101 100 99  CO2 22 - 32 mmol/L 28 30 28   Calcium 8.9 - 10.3 mg/dL 9.2 8.6(L) 8.4(L)    Family Communication: Discussed in detail with patient and his wife at the bedside  Disposition: Status is: Inpatient  Remains inpatient appropriate because: Elevated BP and atrial fibrillation with RVR during exertion and PT, medication changes done, hopefully DC home in a.m.   Time spent: 37 minutes  Author: Estill Cotta, MD 11/15/2021 2:16 PM  For on call review [email or web address].

## 2021-11-15 NOTE — Progress Notes (Signed)
Patient not ready for CPAP at this time. RT will check back at a later time.

## 2021-11-15 NOTE — Discharge Instructions (Addendum)

## 2021-11-15 NOTE — Progress Notes (Signed)
Inpatient Diabetes Program Recommendations  AACE/ADA: New Consensus Statement on Inpatient Glycemic Control (2015)  Target Ranges:  Prepandial:   less than 140 mg/dL      Peak postprandial:   less than 180 mg/dL (1-2 hours)      Critically ill patients:  140 - 180 mg/dL   Lab Results  Component Value Date   GLUCAP 238 (H) 11/15/2021   HGBA1C 7.3 (H) 11/10/2021    Review of Glycemic Control  Latest Reference Range & Units 11/14/21 06:54 11/14/21 11:31 11/14/21 17:05 11/14/21 20:19 11/15/21 08:26  Glucose-Capillary 70 - 99 mg/dL 131 (H) 271 (H) 296 (H) 219 (H) 238 (H)  (H): Data is abnormally high  Diabetes history: DM2 Outpatient Diabetes medications: Metformin 1000 mg BID, Tradjenta 5 QD Current orders for Inpatient glycemic control: Semgle 10 units QD, Novolog 0-15 units tID and 0-5 units QHS, Tradjenta 5 QD  Inpatient Diabetes Program Recommendations:    CBGs elevated from steroids.  Please consider,   Semglee 10 BID  Novolog 3 units TID with meals  Will continue to follow while inpatient.  Thank you, Reche Dixon, MSN, RN Diabetes Coordinator Inpatient Diabetes Program [phone] (team pager from 8a-5p)

## 2021-11-15 NOTE — TOC Benefit Eligibility Note (Signed)
Patient Advocate Encounter ° °Insurance verification completed.   ° °The patient is currently admitted and upon discharge could be taking Eliquis 5 mg. ° °The current 30 day co-pay is, $40.00.  ° °The patient is insured through [employer] Medicare Part D  ° ° ° °Anthony Mueller, CPhT °Pharmacy Patient Advocate Specialist °[facility] Patient Advocate Team °Direct Number: [phone]  Fax: [phone] ° ° ° ° ° °  °

## 2021-11-16 ENCOUNTER — Other Ambulatory Visit: Payer: Self-pay | Admitting: Student

## 2021-11-16 ENCOUNTER — Inpatient Hospital Stay (INDEPENDENT_AMBULATORY_CARE_PROVIDER_SITE_OTHER): Payer: Medicare PPO

## 2021-11-16 DIAGNOSIS — I4819 Other persistent atrial fibrillation: Secondary | ICD-10-CM

## 2021-11-16 DIAGNOSIS — I4821 Permanent atrial fibrillation: Secondary | ICD-10-CM

## 2021-11-16 DIAGNOSIS — R001 Bradycardia, unspecified: Secondary | ICD-10-CM

## 2021-11-16 LAB — GLUCOSE, CAPILLARY: Glucose-Capillary: 140 mg/dL — ABNORMAL HIGH (ref 70–99)

## 2021-11-16 MED ORDER — GLIPIZIDE 5 MG PO TABS: 2.5000 mg | ORAL_TABLET | Freq: Every day | ORAL | 0 refills | Status: DC

## 2021-11-16 MED ORDER — PREDNISONE 10 MG PO TABS: ORAL_TABLET | ORAL | 0 refills | Status: DC

## 2021-11-16 MED ORDER — CARVEDILOL 12.5 MG PO TABS
12.5000 mg | ORAL_TABLET | Freq: Once | ORAL | Status: AC
  Administered 2021-11-16: 12.5 mg via ORAL
  Filled 2021-11-16: qty 1

## 2021-11-16 MED ORDER — APIXABAN 5 MG PO TABS: 5.0000 mg | ORAL_TABLET | Freq: Two times a day (BID) | ORAL | 3 refills | Status: DC

## 2021-11-16 MED ORDER — ASCORBIC ACID 500 MG PO TABS: 500.0000 mg | ORAL_TABLET | Freq: Every day | ORAL | 0 refills | Status: DC

## 2021-11-16 MED ORDER — ZINC SULFATE 220 (50 ZN) MG PO CAPS: 220.0000 mg | ORAL_CAPSULE | Freq: Every day | ORAL | 0 refills | Status: DC

## 2021-11-16 MED ORDER — ALBUTEROL SULFATE HFA 108 (90 BASE) MCG/ACT IN AERS: 2.0000 | INHALATION_SPRAY | Freq: Four times a day (QID) | RESPIRATORY_TRACT | 1 refills | Status: DC | PRN

## 2021-11-16 MED ORDER — CARVEDILOL 25 MG PO TABS: 25.0000 mg | ORAL_TABLET | Freq: Two times a day (BID) | ORAL | Status: DC

## 2021-11-16 NOTE — Progress Notes (Addendum)
OT Cancellation Note  Patient Details Name: Anthony Mueller MRN: [id number] DOB: 02-04-1951   Cancelled Treatment:    Reason Eval/Treat Not Completed: Other (comment). Pt awaiting d/c home momentarily. Per RN, spouse and pt declined OT session. Spouse present and able to assist pt with toilet transfer at w/c level.   Tyrone Schimke, [place] [facility] Office: [phone]  11/16/2021, 1:09 PM

## 2021-11-16 NOTE — Discharge Summary (Signed)
Physician Discharge Summary   Patient: Anthony Mueller MRN: [id number] DOB: 04-Jan-1951  Admit date:     11/10/2021  Discharge date: 11/16/21  Discharge Physician: Estill Cotta   PCP: Maris Berger, MD   Recommendations at discharge:  Continue prednisone with taper 30 mg for 2 days, 20 mg for 2 days, 10 mg for 2 days then off  Placed on glipizide 2.5 mg daily while on prednisone.   Started on Eliquis 5 mg p.o. twice daily for atrial fibrillation, need to stop 5 days prior to outpatient rectal surgery and then resume once cleared by surgery.  Patient will follow-up with cardiology, scheduled outpatient.   Placed on heart monitor at the time of discharge.  Discharge Diagnoses    Acute respiratory failure with hypoxia (HCC), improved   Nausea vomiting and diarrhea   Cholelithiasis   COVID-19 virus infection   Transaminitis   Unspecified atrial fibrillation (HCC) with sinus pauses   Cardiomegaly   Type 2 diabetes mellitus with hyperlipidemia (HCC)   Hypokalemia   Cerebellar degeneration (HCC)   GERD (gastroesophageal reflux disease)     Hospital Course   Patient is a 71 year old male with hypertension, hyperlipidemia, diabetes mellitus type 2, OSA on CPAP, and cerebellar degeneration who is wheelchair-bound at baseline presented with shortness of breath, fevers, chills, worsening cough.  Also reported nausea vomiting diarrhea, dizziness, generalized weakness.  Febrile at the time of admission 101.6.  Currently, pulse 95 200, BP 174/77.  Hypoxia with O2 sats 88% on room air. Patient had tested positive for COVID on 12/25 and had completed 5-day course of Paxlovid.  Also noted to have transaminitis, concern for cholecystitis on abdominal ultrasound and MRCP.  GI, general surgery consulted.  No plans for surgical intervention, recommended outpatient follow-up Noted to have atrial fibrillation with bradycardia, sinus pauses during hospitalization.  Cardiology consulted. Started on  apixaban Currently off O2, feels back to baseline.       * Acute respiratory failure with hypoxia ([place])- (present on admission) - Presented with hypoxia, O2 sats 88% on room air, leukocytosis, shortness of breath, nonproductive cough, fevers.  Patient is vaccinated and had boosters for COVID.  Chest x-ray showed moderate cardiac enlargement, increased from previous x-rays, large hiatal hernia.  UA negative for UTI. -Tested COVID-19 positive on 12/25, has completed 5-day course of Paxlovid outpatient. -Received IV Lasix 40 mg on 1/14 and 1/15.  Now off O2, lung sounds clear.  Resume outpatient oral Lasix 20 mg daily -Transitioned to oral prednisone with a taper upon discharge  Unspecified atrial fibrillation (HCC) with sinus pauses- (present on admission) - Patient noted to be in atrial fibrillation, bradycardia with sinus pauses -Per cardiology, started on apixaban, will need to be stopped prior to his upcoming rectal surgery (stop 5 days prior to the surgery and then resume once cleared by surgery service). -Coreg resumed 25 mg p.o. twice daily, continue eliquis  Transaminitis- (present on admission) - Possibly due to COVID-19 and paxlovid.  - LFTs improving - Appreciate GI and general surgery recommendations, outpatient follow-up recommended  COVID-19 virus infection- (present on admission) - Patient tested positive with COVID-19 on 12/25, has completed 5-day course of Paxlovid.  -CTA negative for acute PE, venous Dopplers negative for DVT. -Continue prednisone taper,  -much improved, currently not requiring any O2, O2 sats 99 200% on room air.  Did not meet criteria for home O2.  Cholelithiasis- (present on admission) - No acute abdominal pain, has transaminitis.  Abd US showed possible obstruction and  thickening of gallbladder wall to suggest chronic cholecystitis -MRCP showed cholelithiasis, mild gallbladder distention and borderline mild diffuse gallbladder wall thickening,  findings equivocal for acute cholecystitis.  No biliary duct dilatation - No plan for surgical intervention at this time. -Outpatient follow-up with general surgery  Nausea vomiting and diarrhea- (present on admission) - Likely due to possible rebound COVID-19 symptoms, improving -Improved, tolerating diet, continue supportive treatment  Hypokalemia- (present on admission) - Resolved  Type 2 diabetes mellitus with hyperlipidemia ([place])- (present on admission) - NIDDM, on Tradjenta and metformin outpatient -Hemoglobin A1c 7.3. -Patient was placed on insulin while inpatient.  He is on prednisone taper and CBGs are expected to improve. Patient recommended to continue glipizide 2.5 mg daily while on prednisone.  He checks his CBGs daily in a.m. and with meals.  Cardiomegaly- (present on admission) - 2D echo showed EF of 55 to 60%, no regional WMA -Resume Lasix 20 mg daily (outpatient dose)  GERD (gastroesophageal reflux disease)- (present on admission) Continue PPI  Cerebellar degeneration ([place]) - History of cerebellar degeneration, wheelchair-bound at baseline        Consultants:  Gastroenterology Surgery Cardiology  Procedures performed: None Disposition: Home Diet recommendation: Carb modified diet  DISCHARGE MEDICATION: Allergies as of 11/16/2021       Reactions   Liraglutide    Other reaction(s): Other (See Comments) CAN NOT TAKE BECAUSE A SIDE EFFECT IS PANCREATITIS AND HE HAS A HISTORY Contraindicated due to pancreatitis   Hydrocodone    Other reaction(s): Other (See Comments) MAKES FEEL SPACEY AND LIGHT HEADED   Hydrocodone-acetaminophen    Other reaction(s): Other (See Comments) Feels " orbity"   Other    Other reaction(s): Other (See Comments) BANDAID  CAUSES RASH   Tape Other (See Comments)   Skin irritation, welts   Latex Rash   Band-aids   Metronidazole Nausea And Vomiting   Other reaction(s): GI Upset (intolerance)  AND DEPRESSION ;   TABLETS Does fine w/ IV Flagyl        Medication List     STOP taking these medications    aspirin EC 81 MG tablet       TAKE these medications    albuterol 108 (90 Base) MCG/ACT inhaler Commonly known as: VENTOLIN HFA Inhale 2 puffs into the lungs every 6 (six) hours as needed for wheezing or shortness of breath.   allopurinol 100 MG tablet Commonly known as: ZYLOPRIM Take 100 mg by mouth in the morning. What changed: Another medication with the same name was removed. Continue taking this medication, and follow the directions you see here.   apixaban 5 MG Tabs tablet Commonly known as: ELIQUIS Take 1 tablet (5 mg total) by mouth 2 (two) times daily.   ascorbic acid 500 MG tablet Commonly known as: VITAMIN C Take 1 tablet (500 mg total) by mouth daily. Start taking on: November 17, 2021   candesartan 32 MG tablet Commonly known as: ATACAND Take 32 mg by mouth daily.   carvedilol 25 MG tablet Commonly known as: COREG Take 25 mg by mouth 2 (two) times daily.   cloNIDine 0.2 MG tablet Commonly known as: CATAPRES Take 0.2 mg by mouth 2 (two) times daily.   felodipine 10 MG 24 hr tablet Commonly known as: PLENDIL Take 10 mg by mouth daily.   furosemide 20 MG tablet Commonly known as: LASIX Take 20 mg by mouth daily.   glipiZIDE 5 MG tablet Commonly known as: Glucotrol Take 0.5 tablets (2.5 mg total) by mouth  daily before breakfast. Take if blood sugar is above 200, while on prednisone   Iron Slow Release 143 (45 Fe) MG Tbcr Generic drug: Ferrous Sulfate Take 143 mg by mouth every evening.   lovastatin 40 MG tablet Commonly known as: MEVACOR Take 40 mg by mouth every evening.   metFORMIN 500 MG tablet Commonly known as: GLUCOPHAGE Take 1,000 mg by mouth 2 (two) times daily.   omeprazole 20 MG capsule Commonly known as: PRILOSEC Take 20 mg by mouth daily.   predniSONE 10 MG tablet Commonly known as: DELTASONE Prednisone dosing: Take  Prednisone  30mg  (3 tabs) x 2 days, then taper to 20mg  (2 tabs) x 2 days, then 10mg  (2 tabs) x 2 days then OFF.   tamsulosin 0.4 MG Caps capsule Commonly known as: FLOMAX Take 0.4 mg by mouth daily.   timolol 0.5 % ophthalmic solution Commonly known as: TIMOPTIC Place 1 drop into both eyes daily.   Tradjenta 5 MG Tabs tablet Generic drug: linagliptin Take 5 mg by mouth daily.   zinc sulfate 220 (50 Zn) MG capsule Take 1 capsule (220 mg total) by mouth daily. Start taking on: November 17, 2021        Follow-up Information     Coralie Keens, MD Follow up in 1 month(s).   Specialty: General Surgery Why: follow up for your gallbladder to reassess Contact information: [address] [place] [address] [phone]         Richardo Priest, MD Follow up.   Specialties: Cardiology, Radiology Why: Hospital follow-up with Cardiology scheduled for 01/05/2022 at 1:40pm. Please arrive 15 minutes early for check-in. If this date/time does not work for you, please call our office to reschedule. Contact information: [place] [address] [phone]         [facility] Follow up.   Specialty: Home Health Services Why: the office to call to schedule home health visits Contact information: [address] [place] [address] [phone]         Maris Berger, MD. Schedule an appointment as soon as possible for a visit in 2 week(s).   Specialty: Family Medicine Why: for hospital follow-up Contact information: [address] [place] [address] [phone]                 Discharge Exam: Danley Danker Weights   11/10/21 0755  Weight: 113.4 kg   S: Doing a lot better, off O2, feels close to his baseline  BP (!) 126/98 (BP Location: Right Arm)    Pulse 92    Temp 97.8 F (36.6 C) (Oral)    Resp 16    Ht 6\' 1"  (1.854 m)    Wt 113.4 kg    SpO2 100%    BMI 32.98 kg/m   Physical Exam General: Alert and  oriented x 3, NAD Cardiovascular: S1 S2 clear, RRR. No pedal edema b/l Respiratory: CTAB, no wheezing, rales or rhonchi Gastrointestinal: Soft, nontender, nondistended, NBS Ext: no pedal edema bilaterally Neuro: no new deficits  Condition at discharge: good  The results of significant diagnostics from this hospitalization (including imaging, microbiology, ancillary and laboratory) are listed below for reference.   Imaging Studies: DG Chest 2 View  Result Date: 11/10/2021 CLINICAL DATA:  Sepsis, shortness of breath and weakness. EXAM: CHEST - 2 VIEW COMPARISON:  05/19/2016 and CT chest 01/30/2015. FINDINGS: Trachea is midline. Heart is enlarged, increased from 05/19/2016. No airspace consolidation  or pleural fluid. Large hiatal hernia. IMPRESSION: 1. No acute findings. 2. Moderate cardiac enlargement, increased from 05/19/2016, despite differences in technique. 3. Large hiatal hernia. Electronically Signed   By: Lorin Picket M.D.   On: 11/10/2021 08:23   CT Angio Chest PE W and/or Wo Contrast  Result Date: 11/10/2021 CLINICAL DATA:  Acute respiratory failure with hypoxia. Pulmonary embolism (PE) suspected, high prob EXAM: CT ANGIOGRAPHY CHEST WITH CONTRAST TECHNIQUE: Multidetector CT imaging of the chest was performed using the standard protocol during bolus administration of intravenous contrast. Multiplanar CT image reconstructions and MIPs were obtained to evaluate the vascular anatomy. RADIATION DOSE REDUCTION: This exam was performed according to the departmental dose-optimization program which includes automated exposure control, adjustment of the mA and/or kV according to patient size and/or use of iterative reconstruction technique. CONTRAST:  159mL OMNIPAQUE IOHEXOL 350 MG/ML SOLN COMPARISON:  Chest radiograph from earlier today. FINDINGS: Cardiovascular: The study is moderate quality for the evaluation of pulmonary embolism, with some motion degradation. There are no filling defects in  the central, lobar, segmental or subsegmental pulmonary artery branches to suggest acute pulmonary embolism. Atherosclerotic nonaneurysmal thoracic aorta. Normal caliber pulmonary arteries. Mild cardiomegaly. No significant pericardial fluid/thickening. Mediastinum/Nodes: No discrete thyroid nodules. Unremarkable esophagus. No pathologically enlarged axillary, mediastinal or hilar lymph nodes. Lungs/Pleura: No pneumothorax. No pleural effusion. Mild hypoventilatory changes in the dependent lungs bilaterally. No acute consolidative airspace disease, lung masses or significant pulmonary nodules. Upper abdomen: Large hiatal hernia. Stomach is nondistended. Cholelithiasis. Musculoskeletal: No aggressive appearing focal osseous lesions. Mild thoracic spondylosis. Review of the MIP images confirms the above findings. IMPRESSION: 1. No evidence of acute pulmonary embolism. 2. Mild hypoventilatory changes in the dependent lungs bilaterally. Otherwise no active pulmonary disease. 3. Mild cardiomegaly. 4. Large hiatal hernia. 5. Cholelithiasis. 6. Aortic Atherosclerosis (ICD10-I70.0). Electronically Signed   By: Ilona Sorrel M.D.   On: 11/10/2021 16:45   MR ABDOMEN MRCP W WO CONTAST  Result Date: 11/10/2021 CLINICAL DATA:  Inpatient. Cholelithiasis, gallbladder wall thickening and dilated common bile duct. Acute respiratory failure with hypoxia. Abnormal liver function tests. EXAM: MRI ABDOMEN WITHOUT AND WITH CONTRAST (INCLUDING MRCP) TECHNIQUE: Multiplanar multisequence MR imaging of the abdomen was performed both before and after the administration of intravenous contrast. Heavily T2-weighted images of the biliary and pancreatic ducts were obtained, and three-dimensional MRCP images were rendered by post processing. CONTRAST:  7mL GADAVIST GADOBUTROL 1 MMOL/ML IV SOLN COMPARISON:  11/10/2021 abdominal sonogram and chest CT angiogram. FINDINGS: Lower chest: Compressive atelectasis from the large hiatal hernia at the  medial lung bases. Hepatobiliary: Normal liver size and configuration. No significant hepatic steatosis. No liver mass. Right ring 8 mm gallstone in the gallbladder with borderline mild gallbladder distension and borderline mild diffuse gallbladder wall thickening. No pericholecystic fluid. No biliary ductal dilatation. Common bile duct diameter 6 mm. No compelling evidence of choledocholithiasis on the motion degraded MRCP sequence. No biliary masses, strictures or beading. 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 Pancreas: No pancreatic mass or duct dilation.  No pancreas divisum. Spleen: Normal size. No mass. Adrenals/Urinary Tract: Normal adrenals. No hydronephrosis. Subcentimeter simple posterior upper right renal cortical cyst. No suspicious renal masses. Stomach/Bowel: Large hiatal hernia. Stomach is nondistended and otherwise normal. Visualized small and large bowel is normal caliber, with no bowel wall thickening. Vascular/Lymphatic: Normal caliber abdominal aorta. Patent portal, splenic, hepatic and renal veins. No pathologically enlarged lymph nodes in the abdomen. Other: No abdominal ascites or focal fluid collection. Musculoskeletal: No aggressive appearing focal  osseous lesions. IMPRESSION: 1. Cholelithiasis. Borderline mild gallbladder distension and borderline mild diffuse gallbladder wall thickening. No pericholecystic fluid. Findings are equivocal for acute cholecystitis. Hepatobiliary scintigraphy study may be obtained as clinically warranted. 2. No biliary ductal dilatation. CBD diameter 6 mm. No compelling evidence of choledocholithiasis on the motion degraded MRCP sequence. 3. 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 4. Large hiatal hernia. Electronically Signed   By: Ilona Sorrel M.D.   On: 11/10/2021 20:56   ECHOCARDIOGRAM COMPLETE  Result Date: 11/11/2021    ECHOCARDIOGRAM REPORT   Patient Name:   VRISHANK MOSTER Date of Exam: 11/11/2021 Medical Rec #:   [id number]      Height:       73.0 in Accession #:    [id number]     Weight:       250.0 lb Date of Birth:  Dec 27, 1950      BSA:          2.366 m Patient Age:    71 years       BP:           137/81 mmHg Patient Gender: M              HR:           72 bpm. Exam Location:  Inpatient Procedure: 2D Echo Indications:    Cardiomegaly  History:        Patient has no prior history of Echocardiogram examinations.                 Risk Factors:Diabetes.  Sonographer:    Jefferey Pica Referring Phys: [id number] RONDELL A SMITH IMPRESSIONS  1. Left ventricular ejection fraction, by estimation, is 55 to 60%. The left ventricle has normal function. The left ventricle has no regional wall motion abnormalities. There is mild left ventricular hypertrophy. Left ventricular diastolic parameters were normal.  2. Right ventricular systolic function is normal. The right ventricular size is normal. There is mildly elevated pulmonary artery systolic pressure. The estimated right ventricular systolic pressure is 78.5 mmHg.  3. Left atrial size was moderately dilated.  4. Right atrial size was mildly dilated.  5. The mitral valve is normal in structure. Mild mitral valve regurgitation. No evidence of mitral stenosis.  6. The aortic valve is tricuspid. Aortic valve regurgitation is trivial. Aortic valve sclerosis/calcification is present, without any evidence of aortic stenosis.  7. The inferior vena cava is dilated in size with <50% respiratory variability, suggesting right atrial pressure of 15 mmHg. FINDINGS  Left Ventricle: Left ventricular ejection fraction, by estimation, is 55 to 60%. The left ventricle has normal function. The left ventricle has no regional wall motion abnormalities. The left ventricular internal cavity size was normal in size. There is  mild left ventricular hypertrophy. Left ventricular diastolic parameters were normal. Right Ventricle: The right ventricular size is normal. No increase in right ventricular wall  thickness. Right ventricular systolic function is normal. There is mildly elevated pulmonary artery systolic pressure. The tricuspid regurgitant velocity is 2.48  m/s, and with an assumed right atrial pressure of 15 mmHg, the estimated right ventricular systolic pressure is 88.5 mmHg. Left Atrium: Left atrial size was moderately dilated. Right Atrium: Right atrial size was mildly dilated. Pericardium: Trivial pericardial effusion is present. Mitral Valve: The mitral valve is normal in structure. Mild mitral valve regurgitation. No evidence of mitral valve stenosis. Tricuspid Valve: The tricuspid valve is normal in structure. Tricuspid valve regurgitation is trivial. Aortic Valve: The aortic valve is tricuspid.  Aortic valve regurgitation is trivial. Aortic valve sclerosis/calcification is present, without any evidence of aortic stenosis. Aortic valve peak gradient measures 6.1 mmHg. Pulmonic Valve: The pulmonic valve was normal in structure. Pulmonic valve regurgitation is not visualized. Aorta: The aortic root is normal in size and structure. Venous: The inferior vena cava is dilated in size with less than 50% respiratory variability, suggesting right atrial pressure of 15 mmHg. IAS/Shunts: No atrial level shunt detected by color flow Doppler.  LEFT VENTRICLE PLAX 2D LVIDd:         5.25 cm   Diastology LVIDs:         3.40 cm   LV e' medial:    8.99 cm/s LV PW:         1.50 cm   LV E/e' medial:  12.0 LV IVS:        1.20 cm   LV e' lateral:   9.43 cm/s LVOT diam:     2.10 cm   LV E/e' lateral: 11.5 LV SV:         92 LV SV Index:   39 LVOT Area:     3.46 cm  RIGHT VENTRICLE             IVC RV Basal diam:  3.50 cm     IVC diam: 2.60 cm RV S prime:     10.30 cm/s TAPSE (M-mode): 1.6 cm LEFT ATRIUM              Index        RIGHT ATRIUM           Index LA diam:        4.10 cm  1.73 cm/m   RA Area:     20.00 cm LA Vol (A2C):   114.0 ml 48.19 ml/m  RA Volume:   57.10 ml  24.14 ml/m LA Vol (A4C):   95.1 ml  40.20 ml/m  LA Biplane Vol: 106.0 ml 44.81 ml/m  AORTIC VALVE                 PULMONIC VALVE AV Area (Vmax): 3.25 cm     PV Vmax:       0.74 m/s AV Vmax:        123.50 cm/s  PV Peak grad:  2.2 mmHg AV Peak Grad:   6.1 mmHg LVOT Vmax:      116.00 cm/s LVOT Vmean:     76.700 cm/s LVOT VTI:       0.266 m  AORTA Ao Root diam: 3.70 cm Ao Asc diam:  3.30 cm MITRAL VALVE                TRICUSPID VALVE MV Area (PHT): 7.82 cm     TR Peak grad:   24.6 mmHg MV Decel Time: 97 msec      TR Vmax:        248.00 cm/s MR Peak grad: 95.6 mmHg MR Vmax:      489.00 cm/s   SHUNTS MV E velocity: 108.00 cm/s  Systemic VTI:  0.27 m MV A velocity: 82.70 cm/s   Systemic Diam: 2.10 cm MV E/A ratio:  1.31 Dalton McleanMD Electronically signed by Franki Monte Signature Date/Time: 11/11/2021/5:04:34 PM    Final    VAS [place] LOWER EXTREMITY VENOUS (DVT)  Result Date: 11/11/2021  Lower Venous DVT Study Patient Name:  ILDEFONSO KEANEY  Date of Exam:   11/11/2021 Medical Rec #: [id number]       Accession #:  [id number] Date of Birth: 1951/02/10       Patient Gender: M Patient Age:   5 years Exam Location:  [facility] Procedure:      VAS [place] LOWER EXTREMITY VENOUS (DVT) Referring Phys: Raychell Holcomb --------------------------------------------------------------------------------  Indications: Edema, and elevated ddimer.  Comparison Study: no prior Performing Technologist: Archie Patten RVS  Examination Guidelines: A complete evaluation includes B-mode imaging, spectral Doppler, color Doppler, and power Doppler as needed of all accessible portions of each vessel. Bilateral testing is considered an integral part of a complete examination. Limited examinations for reoccurring indications may be performed as noted. The reflux portion of the exam is performed with the patient in reverse Trendelenburg.  +---------+---------------+---------+-----------+----------+--------------+  RIGHT     Compressibility Phasicity Spontaneity Properties Thrombus Aging   +---------+---------------+---------+-----------+----------+--------------+  CFV       Full            Yes       Yes                                    +---------+---------------+---------+-----------+----------+--------------+  SFJ       Full                                                             +---------+---------------+---------+-----------+----------+--------------+  FV Prox   Full                                                             +---------+---------------+---------+-----------+----------+--------------+  FV Mid    Full                                                             +---------+---------------+---------+-----------+----------+--------------+  FV Distal Full                                                             +---------+---------------+---------+-----------+----------+--------------+  PFV       Full                                                             +---------+---------------+---------+-----------+----------+--------------+  POP       Full            Yes       Yes                                    +---------+---------------+---------+-----------+----------+--------------+  PTV       Full                                                             +---------+---------------+---------+-----------+----------+--------------+  PERO      Full                                                             +---------+---------------+---------+-----------+----------+--------------+   +---------+---------------+---------+-----------+----------+--------------+  LEFT      Compressibility Phasicity Spontaneity Properties Thrombus Aging  +---------+---------------+---------+-----------+----------+--------------+  CFV       Full            Yes       Yes                                    +---------+---------------+---------+-----------+----------+--------------+  SFJ       Full                                                              +---------+---------------+---------+-----------+----------+--------------+  FV Prox   Full                                                             +---------+---------------+---------+-----------+----------+--------------+  FV Mid    Full                                                             +---------+---------------+---------+-----------+----------+--------------+  FV Distal Full                                                             +---------+---------------+---------+-----------+----------+--------------+  PFV       Full                                                             +---------+---------------+---------+-----------+----------+--------------+  POP       Full            Yes       Yes                                    +---------+---------------+---------+-----------+----------+--------------+  PTV       Full                                                             +---------+---------------+---------+-----------+----------+--------------+  PERO      Full                                                             +---------+---------------+---------+-----------+----------+--------------+     Summary: BILATERAL: - No evidence of deep vein thrombosis seen in the lower extremities, bilaterally. -No evidence of popliteal cyst, bilaterally.   *See table(s) above for measurements and observations. Electronically signed by Orlie Pollen on 11/11/2021 at 3:56:29 PM.    Final    US Abdomen Limited RUQ (LIVER/GB)  Result Date: 11/10/2021 CLINICAL DATA:  Abnormal liver function tests EXAM: ULTRASOUND ABDOMEN LIMITED RIGHT UPPER QUADRANT COMPARISON:  01/28/2015 FINDINGS: Gallbladder: There is 11 mm hyperechoic focus in the dependent portion of gallbladder neck. There is no demonstrable acoustic shadowing. There is no demonstrable intraluminal mobility in the submitted images. There is wall thickening in gallbladder measuring up to 4.7 mm. There is no fluid around the gallbladder.  Technologist did not identify definite focal tenderness. Common bile duct: Diameter: 9 mm. Distal common bile duct is not adequately visualized. There is no dilation of intrahepatic bile ducts. Liver: There is increased echogenicity. No focal abnormality is seen in the visualized portions of liver. Portal vein is patent on color Doppler imaging with normal direction of blood flow towards the liver. Other: None. IMPRESSION: There is 11 mm echogenic focus in the dependent portion of neck of the gallbladder suggesting possible gallbladder stone or sludge. There is no definite demonstrable intraluminal mobility of this echogenic structure. There is wall thickening in gallbladder, possibly suggesting chronic cholecystitis. There are no definite sonographic signs of acute cholecystitis. There is prominence of proximal common bile duct measuring 9 mm. Please correlate with laboratory findings to evaluate for possible obstruction of distal common bile duct. Increased echogenicity liver suggests fatty infiltration. Electronically Signed   By: Elmer Picker M.D.   On: 11/10/2021 12:38    Microbiology: Results for orders placed or performed during the hospital encounter of 11/10/21  Resp Panel by RT-PCR (Flu A&B, Covid) Nasopharyngeal Swab     Status: Abnormal   Collection Time: 11/10/21  7:56 AM   Specimen: Nasopharyngeal Swab; Nasopharyngeal(NP) swabs in vial transport medium  Result Value Ref Range Status   SARS Coronavirus 2 by RT PCR POSITIVE (A) NEGATIVE Final    Comment: (NOTE) SARS-CoV-2 target nucleic acids are DETECTED.  The SARS-CoV-2 RNA is generally detectable in upper respiratory specimens during the acute phase of infection. Positive results are indicative of the presence of the identified virus, but do not rule out bacterial infection or co-infection with other pathogens not detected by the test. Clinical correlation with patient history and other diagnostic information is necessary to  determine patient infection status. The expected result is Negative.  Fact Sheet for Patients: EntrepreneurPulse.com.au  Fact Sheet for Healthcare Providers: IncredibleEmployment.be  This test is not yet approved or cleared by the [place]  FDA and  has been authorized for detection and/or diagnosis of SARS-CoV-2 by FDA under an Emergency Use Authorization (EUA).  This EUA will remain in effect (meaning this test can be used) for the duration of  the COVID-19 declaration under Section 564(b)(1) of the A ct, 21 U.S.C. section 360bbb-3(b)(1), unless the authorization is terminated or revoked sooner.     Influenza A by PCR NEGATIVE NEGATIVE Final   Influenza B by PCR NEGATIVE NEGATIVE Final    Comment: (NOTE) The Xpert Xpress SARS-CoV-2/FLU/RSV plus assay is intended as an aid in the diagnosis of influenza from Nasopharyngeal swab specimens and should not be used as a sole basis for treatment. Nasal washings and aspirates are unacceptable for Xpert Xpress SARS-CoV-2/FLU/RSV testing.  Fact Sheet for Patients: EntrepreneurPulse.com.au  Fact Sheet for Healthcare Providers: IncredibleEmployment.be  This test is not yet approved or cleared by the [place] FDA and has been authorized for detection and/or diagnosis of SARS-CoV-2 by FDA under an Emergency Use Authorization (EUA). This EUA will remain in effect (meaning this test can be used) for the duration of the COVID-19 declaration under Section 564(b)(1) of the Act, 21 U.S.C. section 360bbb-3(b)(1), unless the authorization is terminated or revoked.  Performed at [facility], [place] [address] [place], [place] [address]   Culture, blood (Routine x 2)     Status: None   Collection Time: 11/10/21  8:06 AM   Specimen: BLOOD  Result Value Ref Range Status   Specimen Description BLOOD LEFT ANTECUBITAL  Final   Special Requests   Final    BOTTLES  DRAWN AEROBIC AND ANAEROBIC Blood Culture results may not be optimal due to an excessive volume of blood received in culture bottles   Culture   Final    NO GROWTH 5 DAYS Performed at [facility], [place] [address], [place], [place] [address]    Report Status 11/15/2021 FINAL  Final  Culture, blood (Routine x 2)     Status: None   Collection Time: 11/10/21 10:17 AM   Specimen: BLOOD RIGHT FOREARM  Result Value Ref Range Status   Specimen Description BLOOD RIGHT FOREARM  Final   Special Requests   Final    BOTTLES DRAWN AEROBIC AND ANAEROBIC Blood Culture adequate volume   Culture   Final    NO GROWTH 5 DAYS Performed at [facility], [place] [address] [place], [place] [address]    Report Status 11/15/2021 FINAL  Final    Labs: CBC: Recent Labs  Lab 11/11/21 0350 11/12/21 0226 11/13/21 0149 11/14/21 0231 11/15/21 0445  WBC 13.0* 17.8* 10.7* 12.6* 16.2*  NEUTROABS 11.2* 15.3* 9.0* 9.4* 11.1*  HGB 9.4* 9.4* 9.9* 11.0* 12.0*  HCT 28.1* 28.9* 29.6* 34.2* 35.7*  MCV 88.1 89.2 87.8 87.9 86.0  PLT 171 226 240 325 950   Basic Metabolic Panel: Recent Labs  Lab 11/11/21 0350 11/11/21 1300 11/12/21 0226 11/13/21 0149 11/14/21 0231 11/15/21 0445  NA 138  --  138 138 138 138  K 3.7  --  3.9 3.7 4.2 3.2*  CL 98  --  97* 99 100 101  CO2 29  --  29 28 30 28   GLUCOSE 222*  --  187* 190* 167* 129*  BUN 14  --  22 22 15 19   CREATININE 1.18  --  1.45* 1.13 1.07 1.15  CALCIUM 7.8*  --  8.1* 8.4* 8.6* 9.2  MG 1.1* 1.4* 2.4 2.0 1.6* 1.7  PHOS 2.7  --  3.3 4.0 3.0 3.8   Liver Function Tests: Recent Labs  Lab 11/11/21 0350 11/12/21 0226 11/13/21 0149 11/14/21 0231 11/15/21 0445  AST 79* 51* 39 42* 38  ALT 136* 116* 104* 112* 108*  ALKPHOS 64 69 66 76 66  BILITOT 1.5* 0.8 0.8 0.8 0.7  PROT 6.0* 6.6 6.4* 7.4 6.9  ALBUMIN 2.6* 2.8* 2.7* 2.9* 2.9*   CBG: Recent Labs  Lab 11/15/21 0826 11/15/21 1209 11/15/21 1532 11/15/21 2305 11/16/21 0644  GLUCAP 238* 229*  230* 320* 140*    Discharge time spent: greater than 30 minutes.  Signed: Estill Cotta, MD [facility] 11/16/2021

## 2021-11-16 NOTE — Progress Notes (Signed)
RT Note:  Patient resting comfortably on CPAP. Previous settings. 21% FiO2

## 2021-11-16 NOTE — Progress Notes (Signed)
Ordered 2 week Zio monitor to assess for any additional pauses/significant bradycardia per Dr. Gasper Sells. Please see his rounding note from today for additional information.  Darreld Mclean, PA-C 11/16/2021 11:16 AM

## 2021-11-16 NOTE — Progress Notes (Signed)
Progress Note  Patient Name: Anthony Mueller Date of Encounter: 11/16/2021  Primary Cardiologist: New To [place] Lives in [place], eventually wishes to follow up in [place]  Subjective   No further pauses. Asymptomatic. BP has improved HR elevated with PT.  Inpatient Medications    Scheduled Meds:  allopurinol  100 mg Oral q AM   apixaban  5 mg Oral BID   vitamin C  500 mg Oral Daily   carvedilol  12.5 mg Oral Once   carvedilol  25 mg Oral BID WC   cloNIDine  0.2 mg Oral Daily   felodipine  10 mg Oral Daily   furosemide  20 mg Oral Daily   insulin aspart  0-15 Units Subcutaneous TID WC   insulin aspart  0-5 Units Subcutaneous QHS   insulin glargine-yfgn  10 Units Subcutaneous BID   irbesartan  300 mg Oral Daily   linagliptin  5 mg Oral Daily   melatonin  3 mg Oral QHS   pantoprazole  40 mg Oral Daily   predniSONE  40 mg Oral QAC breakfast   sodium chloride flush  3 mL Intravenous Q12H   tamsulosin  0.4 mg Oral Daily   timolol  1 drop Both Eyes Daily   zinc sulfate  220 mg Oral Daily   Continuous Infusions:   PRN Meds: albuterol, guaiFENesin-dextromethorphan, hydrALAZINE, ondansetron **OR** ondansetron (ZOFRAN) IV   Vital Signs    Vitals:   11/15/21 1618 11/15/21 1930 11/16/21 0015 11/16/21 0831  BP: (!) 152/92 (!) 159/100  (!) 163/94  Pulse:  98 89 98  Resp:  17 16 17   Temp:  97.8 F (36.6 C)    TempSrc:  Oral  Oral  SpO2:   97% 100%  Weight:      Height:        Intake/Output Summary (Last 24 hours) at 11/16/2021 0852 Last data filed at 11/16/2021 3710 Gross per 24 hour  Intake 480 ml  Output --  Net 480 ml   Filed Weights   11/10/21 0755  Weight: 113.4 kg    Telemetry    AF rates 90s -120s - Personally Reviewed    Physical Exam   Gen: no distress  Neck: No JVD,  Ears: bilateral Frank Sign Cardiac: No Rubs or Gallops, no Murmur, IRIR tachycardia Respiratory: CTAB GI: Soft, nontender, non-distended  MS: No  edema; BUE strength  in tact Integument: Skin feels warm Neuro:  At time of evaluation, alert and oriented to person/place/time/situation  Psych: Normal affect, patient feels well   Labs    Chemistry Recent Labs  Lab 11/13/21 0149 11/14/21 0231 11/15/21 0445  NA 138 138 138  K 3.7 4.2 3.2*  CL 99 100 101  CO2 28 30 28   GLUCOSE 190* 167* 129*  BUN 22 15 19   CREATININE 1.13 1.07 1.15  CALCIUM 8.4* 8.6* 9.2  PROT 6.4* 7.4 6.9  ALBUMIN 2.7* 2.9* 2.9*  AST 39 42* 38  ALT 104* 112* 108*  ALKPHOS 66 76 66  BILITOT 0.8 0.8 0.7  GFRNONAA >60 >60 >60  ANIONGAP 11 8 9      Hematology Recent Labs  Lab 11/13/21 0149 11/14/21 0231 11/15/21 0445  WBC 10.7* 12.6* 16.2*  RBC 3.37* 3.89* 4.15*  HGB 9.9* 11.0* 12.0*  HCT 29.6* 34.2* 35.7*  MCV 87.8 87.9 86.0  MCH 29.4 28.3 28.9  MCHC 33.4 32.2 33.6  RDW 14.7 14.7 14.7  PLT 240 325 341    Cardiac EnzymesNo results for input(s): TROPONINI  in the last 168 hours. No results for input(s): TROPIPOC in the last 168 hours.   BNP Recent Labs  Lab 11/10/21 1214  BNP 426.9*     DDimer  Recent Labs  Lab 11/13/21 0149 11/14/21 0231 11/15/21 0445  DDIMER 1.76* 1.76* 1.22*     Radiology    No results found.  Cardiac Studies   EF persevered no MVP on last echo  1. Left ventricular ejection fraction, by estimation, is 55 to 60%. The  left ventricle has normal function. The left ventricle has no regional  wall motion abnormalities. There is mild left ventricular hypertrophy.  Left ventricular diastolic parameters  were normal.   2. Right ventricular systolic function is normal. The right ventricular  size is normal. There is mildly elevated pulmonary artery systolic  pressure. The estimated right ventricular systolic pressure is 31.5 mmHg.   3. Left atrial size was moderately dilated.   4. Right atrial size was mildly dilated.   5. The mitral valve is normal in structure. Mild mitral valve  regurgitation. No evidence of mitral stenosis.    6. The aortic valve is tricuspid. Aortic valve regurgitation is trivial.  Aortic valve sclerosis/calcification is present, without any evidence of  aortic stenosis.   7. The inferior vena cava is dilated in size with <50% respiratory  variability, suggesting right atrial pressure of 15 mmHg.   Patient Profile     71 y.o. male with HTN with DM and HLD with DM, Cerebellar degeneration who is being seen 11/13/2021 for the evaluation of bradycardia found to have AF  Assessment & Plan    PAF duration unknown (asymptomatic) CHASDVASC is 3 HTN AF pause 11/13/21 (asymptomatic < 5 s X 1) COVID-19 with persistent hypoxic respiratory failure - eliquis no ASA at DC - this can be stopped prior to his upcoming rectal surgery (5 days prior then resumed when OK by surgery - Increasd back to coreg to 25 mg PO BID - on home candesartan equivalent - will plan for outpatient heart monitor given that patient has the pause on Coreg 25 mg PO daily and clonidine - outpatient LDL therapy based on liver enzymes as he has aortic atherosclerosis and CAC  [facility] will sign off.   Medication Recommendations:  Eliquis, Coreg 25 mg PO BID, Clonidinde Other recommendations (labs, testing, etc):  outpatient heart monitor Follow up as an outpatient:  01/05/22 f/u [place]    For questions or updates, please contact [facility] Please consult [email or web address] for contact info under Cardiology/STEMI.      Signed, Werner Lean, MD  11/16/2021, 8:52 AM

## 2021-11-16 NOTE — Progress Notes (Unsigned)
Enrolled patient for a 14 day Zio XT monitor to be mailed to patients home   Dr Gasper Sells to read?

## 2021-11-19 DIAGNOSIS — R001 Bradycardia, unspecified: Secondary | ICD-10-CM | POA: Diagnosis not present

## 2021-11-19 DIAGNOSIS — I4819 Other persistent atrial fibrillation: Secondary | ICD-10-CM | POA: Diagnosis not present

## 2021-12-16 DIAGNOSIS — I4819 Other persistent atrial fibrillation: Secondary | ICD-10-CM

## 2021-12-16 DIAGNOSIS — Z7901 Long term (current) use of anticoagulants: Secondary | ICD-10-CM | POA: Insufficient documentation

## 2021-12-16 DIAGNOSIS — D6869 Other thrombophilia: Secondary | ICD-10-CM | POA: Insufficient documentation

## 2021-12-16 HISTORY — DX: Other persistent atrial fibrillation: I48.19

## 2021-12-16 HISTORY — DX: Other thrombophilia: D68.69

## 2021-12-16 HISTORY — DX: Long term (current) use of anticoagulants: Z79.01

## 2021-12-23 DIAGNOSIS — R29898 Other symptoms and signs involving the musculoskeletal system: Secondary | ICD-10-CM | POA: Insufficient documentation

## 2021-12-23 HISTORY — DX: Other symptoms and signs involving the musculoskeletal system: R29.898

## 2022-01-04 NOTE — Progress Notes (Signed)
Cardiology Office Note:    Date:  01/05/2022   ID:  Anthony Mueller, DOB 1951/03/11, MRN [id number]  PCP:  Maris Berger, MD  Cardiologist:  Shirlee More, MD   Referring MD: Maris Berger, MD  ASSESSMENT:    1. Persistent atrial fibrillation ([place])   2. Chronic anticoagulation   3. VT (ventricular tachycardia)   4. Hypertensive heart disease with chronic diastolic congestive heart failure ([place])    PLAN:    In order of problems listed above:  For now his rate is controlled continue his beta-blocker and reassess heart rate control recovery from this hospitalization at this time I would not advise cardioversion Continue his anticoagulant If continued presence of VT start amiodarone He has profound peripheral edema likely influenced by his calcium channel blocker will discontinue blood pressure is relatively low does not require another agent and transition from furosemide to torsemide which is a better more effective predictable diuretic recheck renal function proBNP today continue his other antihypertensives Atacand clonidine.  Next appointment 6 weeks   Medication Adjustments/Labs and Tests Ordered: Current medicines are reviewed at length with the patient today.  Concerns regarding medicines are outlined above.  No orders of the defined types were placed in this encounter.  No orders of the defined types were placed in this encounter.    Chief Complaint  Patient presents with   Atrial Fibrillation    Age-indeterminate noted during admission to [facility] January 2023 with respiratory failure COVID-19 infection is alert monitor showed PVCs and brief runs of PVCs longest 11   pvc's    His monitor showed brief runs of PVCs, longest episode of ventricular tachycardia 15 complexes   Congestive Heart Failure    History of heart failure with IV diuretics during his admission to the hospital   Anticoagulation   Hypotension  He had recent complex hospitalization ongoing  severe lower extremity edema.  History of Present Illness:    Anthony Mueller is a 71 y.o. male with a history of type 2 diabetes hyperlipidemia obstructive sleep apnea who is being seen today to establish cardiology care at the request of Maris Berger, MD.  He was recently admitted to [facility] 11/10/2021 11/16/2021 with acute respiratory failure following COVID-19 infection.  He was hypoxic was treated with steroids and received IV Lasix but he does not have a diagnosis of heart failure.  He has noted to be in atrial fibrillation and was anticoagulated and continued on a beta-blocker.  He was seen by heart care in hospital 11/16/2021 for his atrial fibrillation with recommendations to use an outpatient ambulatory heart rhythm monitor with mild pauses in hospital and to continue anticoagulation.  His event monitor was reported 12/10/2021 with continuous atrial fibrillation throughout 14 episodes of a pauses the longest 3.5 seconds while the ventricular ectopy was occasional he had 11 episodes of ventricular tachycardia the longest 15 complexes at a slow rate of 103 bpm  CTA of the chest 11/10/2021 showed cardiomegaly aortic atherosclerosis.  An echocardiogram performed 11/11/2021 at [facility] left ventricle is normal in size mild concentric LVH ejection fraction 55 to 60%.  The right ventricle normal in size and function with mildly elevated pulmonary artery systolic pressure 40 mmHg left atrium is moderately dilated right atrium mildly dilated there is trivial aortic and mild mitral regurgitation.  1. Left ventricular ejection fraction, by estimation, is 55 to 60%. The  left ventricle has normal function. The left ventricle has no regional  wall motion abnormalities. There is mild left ventricular hypertrophy.  Left ventricular diastolic parameters  were normal.   2. Right ventricular systolic function is normal. The right ventricular  size is normal. There is mildly  elevated pulmonary artery systolic  pressure. The estimated right ventricular systolic pressure is 85.6 mmHg.   3. Left atrial size was moderately dilated.   4. Right atrial size was mildly dilated.   5. The mitral valve is normal in structure. Mild mitral valve  regurgitation. No evidence of mitral stenosis.   6. The aortic valve is tricuspid. Aortic valve regurgitation is trivial.  Aortic valve sclerosis/calcification is present, without any evidence of  aortic stenosis.   7. The inferior vena cava is dilated in size with <50% respiratory  variability, suggesting right atrial pressure of 15 mmHg.   Is a very complex visit his wife is present participates in evaluation decision making He has a long history of lower extremity edema worsened since hospitalization and is also having shortness of breath.  He has obstructive sleep apnea and sleeps with CPAP does not have orthopnea.  No chest pain palpitation or syncope he is unaware of atrial fibrillation Takes a calcium channel blocker that may be greatly influencing his severe disproportional lower extremity edema we will stop and transition to torsemide as a better diuretic for fluid overload. And arrangements to have Unna boots as he has oozing in the lower extremities His atrial fibrillation is age-indeterminate He has had no bleeding from his anticoagulant. His event monitor is markedly abnormal soon after discharge for him does repeat an event monitor for 7 days and if he still has frequent or repetitive PVCs I will initiate him on amiodarone. At this time I would not advise cardioversion. Past Medical History:  Diagnosis Date   Cerebellar degeneration ([place])    Diabetes mellitus without complication ([place])    Hyperlipidemia    Hypertension     No past surgical history on file.  Current Medications: No outpatient medications have been marked as taking for the 01/05/22 encounter (Office Visit) with Richardo Priest, MD.     Allergies:    Liraglutide, Hydrocodone, Hydrocodone-acetaminophen, Other, Tape, Latex, and Metronidazole   Social History   Socioeconomic History   Marital status: Married    Spouse name: Not on file   Number of children: Not on file   Years of education: Not on file   Highest education level: Not on file  Occupational History   Not on file  Tobacco Use   Smoking status: Never    Passive exposure: Never   Smokeless tobacco: Never  Vaping Use   Vaping Use: Never used  Substance and Sexual Activity   Alcohol use: Not Currently   Drug use: Never   Sexual activity: Not on file  Other Topics Concern   Not on file  Social History Narrative   Not on file   Social Determinants of Health   Financial Resource Strain: Not on file  Food Insecurity: Not on file  Transportation Needs: Not on file  Physical Activity: Not on file  Stress: Not on file  Social Connections: Not on file     Family History: The patient's family history is not on file.  ROS:   ROS Please see the history of present illness.     All other systems reviewed and are negative.  EKGs/Labs/Other Studies Reviewed:    The following studies were reviewed today:   Recent Labs: 11/10/2021: B Natriuretic Peptide 426.9 11/15/2021:  ALT 108; BUN 19; Creatinine, Ser 1.15; Hemoglobin 12.0; Magnesium 1.7; Platelets 341; Potassium 3.2; Sodium 138  Recent Lipid Panel No results found for: CHOL, TRIG, HDL, CHOLHDL, VLDL, LDLCALC, LDLDIRECT  Physical Exam:    VS:  BP 104/60 (BP Location: Right Arm)    Pulse 78    Ht '6\' 1"'$  (1.854 m)    Wt 245 lb (111.1 kg) Comment: 2 wks ago per pt / not able to weigh today   SpO2 92%    BMI 32.32 kg/m     Wt Readings from Last 3 Encounters:  01/05/22 245 lb (111.1 kg)  11/10/21 250 lb (113.4 kg)     GEN: He looks chronically ill in a wheelchair and has 4+ edema to groin bilaterally well nourished, well developed in no acute distress HEENT: Normal NECK: No JVD; No carotid bruits LYMPHATICS: No  lymphadenopathy CARDIAC: Irregular rate and rhythm no murmurs, rubs, gallops RESPIRATORY:  Clear to auscultation without rales, wheezing or rhonchi  ABDOMEN: Soft, non-tender, non-distended MUSCULOSKELETAL: Severe bilateral lower extremity edema edema; No deformity  SKIN: Warm and dry NEUROLOGIC:  Alert and oriented x 3 PSYCHIATRIC:  Normal affect     Signed, Shirlee More, MD  01/05/2022 1:56 PM    [facility]

## 2022-01-05 ENCOUNTER — Ambulatory Visit (INDEPENDENT_AMBULATORY_CARE_PROVIDER_SITE_OTHER): Payer: Medicare PPO

## 2022-01-05 ENCOUNTER — Other Ambulatory Visit: Payer: Self-pay

## 2022-01-05 ENCOUNTER — Encounter: Payer: Self-pay | Admitting: Cardiology

## 2022-01-05 ENCOUNTER — Ambulatory Visit: Payer: Medicare PPO | Admitting: Cardiology

## 2022-01-05 VITALS — BP 104/60 | HR 78 | Ht 73.0 in | Wt 245.0 lb

## 2022-01-05 DIAGNOSIS — Z7901 Long term (current) use of anticoagulants: Secondary | ICD-10-CM | POA: Diagnosis not present

## 2022-01-05 DIAGNOSIS — I110 Hypertensive heart disease with heart failure: Secondary | ICD-10-CM

## 2022-01-05 DIAGNOSIS — I4819 Other persistent atrial fibrillation: Secondary | ICD-10-CM

## 2022-01-05 DIAGNOSIS — I472 Ventricular tachycardia, unspecified: Secondary | ICD-10-CM

## 2022-01-05 DIAGNOSIS — E119 Type 2 diabetes mellitus without complications: Secondary | ICD-10-CM | POA: Insufficient documentation

## 2022-01-05 DIAGNOSIS — I119 Hypertensive heart disease without heart failure: Secondary | ICD-10-CM | POA: Insufficient documentation

## 2022-01-05 DIAGNOSIS — E785 Hyperlipidemia, unspecified: Secondary | ICD-10-CM | POA: Insufficient documentation

## 2022-01-05 DIAGNOSIS — I5032 Chronic diastolic (congestive) heart failure: Secondary | ICD-10-CM

## 2022-01-05 HISTORY — DX: Hypertensive heart disease without heart failure: I11.9

## 2022-01-05 MED ORDER — TORSEMIDE 20 MG PO TABS: 20.0000 mg | ORAL_TABLET | Freq: Two times a day (BID) | ORAL | 3 refills | Status: DC

## 2022-01-05 NOTE — Patient Instructions (Signed)
1. Avoid all over-the-counter antihistamines except Claritin/Loratadine and Zyrtec/Cetrizine. ?2. Avoid all combination including cold sinus allergies flu decongestant and sleep medications ?3. You can use Robitussin DM Mucinex and Mucinex DM for cough. ?4. can use Tylenol aspirin ibuprofen and naproxen but no combinations such as sleep or sinus. Medication Instructions:  ?Your physician has recommended you make the following change in your medication: Discontinue Plendil ?Discontinue Furosemide ?Start Torsemide 20 mg one time daily ? ?*If you need a refill on your cardiac medications before your next appointment, please call your pharmacy* ? ? ?Lab Work: ?Your physician recommends that you return for lab work in: 1 week for BMP, Pro Bnp and Magnesium ? ?If you have labs (blood work) drawn today and your tests are completely normal, you will receive your results only by: ?MyChart Message (if you have MyChart) OR ?A paper copy in the mail ?If you have any lab test that is abnormal or we need to change your treatment, we will call you to review the results. ? ? ?Testing/Procedures: ?You have been asked to wear a zio heart monitor for 7 days. After wear time please mail back in box given and we will call you with results ? ? ?Follow-Up: ?At [facility], you and your health needs are our priority.  As part of our continuing mission to provide you with exceptional heart care, we have created designated Provider Care Teams.  These Care Teams include your primary Cardiologist (physician) and Advanced Practice Providers (APPs -  Physician Assistants and Nurse Practitioners) who all work together to provide you with the care you need, when you need it. ? ?We recommend signing up for the patient portal called "MyChart".  Sign up information is provided on this After Visit Summary.  MyChart is used to connect with patients for Virtual Visits (Telemedicine).  Patients are able to view lab/test results, encounter notes,  upcoming appointments, etc.  Non-urgent messages can be sent to your provider as well.   ?To learn more about what you can do with MyChart, go to [email or web address].   ? ?Your next appointment:   ?6 week(s) ? ?The format for your next appointment:   ?In Person ? ?Provider:   ?Shirlee More, MD  ? ? ?Other Instructions ?Step One- Record your EKG strip on [facility] app.  ? ?Step two- On [facility] EKG click ?Download?  ? ?Step three- It will prompt you to make a password for this EKG. Please make the password ?Bettina Gavia? so that we can view it.  ? ?Step four- Click on the little ?upload? button (small box with an arrow in the middle) in the bottom left-hand corner of the screen.  ? ?Step five- Click ?Save to Files? ? ?Step six- Click on ?On my iphone? and then ?Pages? then press save in the top right-hand corner.  ? ?NOW GO TO MYCHART  ? ?Once on MyChart click ?Messages? ? ?Step one- Click ?Send a message? ? ?Step two- Click ?Ask a medical question?  ? ?Step three- Click ?Non urgent medical question?  ? ?Step four- Click on [facility] name. ? ?Step five- Click on the small paperclip at the bottom of the screen ? ?Step six- Click ?Choose file? ? ?Step seven- Pick the most recent EKG strip listed.  ? ?Once uploaded send the message!   ?

## 2022-01-07 ENCOUNTER — Telehealth: Payer: Self-pay | Admitting: Cardiology

## 2022-01-07 NOTE — Telephone Encounter (Signed)
Spoke to pt's wife. ?Reports that Torsemide Rx sent in said for pt to take BID but AVS from the office says once daily. ?Pt took it BID yesterday. ?He is urinating but unsure how much pt has lost as he cannot get up to weigh.  ? ?Wife reports pt is having worsening issues with difficulty breathing ?He awoke about 1am this morning and felt like he needed to go to ED because of difficulty breathing.  He did not go. ?Today PT is there and pt O2 sats at rest are avg 86/87%.   Goes up to 93% with deep breaths. ? ? ?Will forward to Dr. Bettina Gavia for review/advisement on Rx and O2 sats ? ? ?

## 2022-01-07 NOTE — Telephone Encounter (Signed)
Pt c/o medication issue: ? ?1. Name of Medication:  ?torsemide (DEMADEX) 20 MG tablet ? ?2. How are you currently taking this medication (dosage and times per day)?  ? ?3. Are you having a reaction (difficulty breathing--STAT)?  ?Difficulty breathing  ?4. What is your medication issue?  ? ?Patient's wife states since beginning Toresmide patient has had increased SOB an difficulty breathing mainly at night to the point where his face turns red. Oxygen is in the high 80's-90's. Patient also lost about 4 lbs in fluid, but has frequent urination. No appetite. Patient's wife would like to clarify how he should be taking the medication because the instructions from the pharmacy are not the same as instructions from Dr. Bettina Gavia. Please advise. ? ?

## 2022-01-07 NOTE — Telephone Encounter (Signed)
Advised of Dr. Bettina Gavia recommendation. ? ? ?However as stated pt cannot get up to weigh. ?Wife reports he does not have the balance/stamina to weigh on the bathroom scale, nor does he have something to hold onto to do so. ?She reports that her brother has a sling scale that can weight a horse/cow and wonders if they installed that in home would that work...Marland KitchenMarland Kitchen aware will forward to MD for advisement. ? ?She also asking if pt should be on fluid restrictions, and stated they forgot to ask. ?Aware will wait for MD advisement and office will call them next week with his recommendations.... ?

## 2022-01-11 ENCOUNTER — Encounter: Payer: Self-pay | Admitting: Cardiology

## 2022-01-13 ENCOUNTER — Other Ambulatory Visit: Payer: Self-pay

## 2022-01-13 ENCOUNTER — Emergency Department (HOSPITAL_COMMUNITY): Payer: Medicare Other

## 2022-01-13 ENCOUNTER — Encounter (HOSPITAL_COMMUNITY): Payer: Self-pay | Admitting: Emergency Medicine

## 2022-01-13 ENCOUNTER — Inpatient Hospital Stay (HOSPITAL_COMMUNITY)
Admission: EM | Admit: 2022-01-13 | Discharge: 2022-01-15 | DRG: 291 | Disposition: A | Discharge status: Home / Self Care | Payer: Medicare Other | Attending: Family Medicine | Admitting: Family Medicine

## 2022-01-13 ENCOUNTER — Telehealth: Payer: Self-pay

## 2022-01-13 DIAGNOSIS — Z7901 Long term (current) use of anticoagulants: Secondary | ICD-10-CM | POA: Diagnosis not present

## 2022-01-13 DIAGNOSIS — Z91040 Latex allergy status: Secondary | ICD-10-CM

## 2022-01-13 DIAGNOSIS — G319 Degenerative disease of nervous system, unspecified: Secondary | ICD-10-CM

## 2022-01-13 DIAGNOSIS — Z83438 Family history of other disorder of lipoprotein metabolism and other lipidemia: Secondary | ICD-10-CM | POA: Diagnosis not present

## 2022-01-13 DIAGNOSIS — K625 Hemorrhage of anus and rectum: Secondary | ICD-10-CM | POA: Diagnosis present

## 2022-01-13 DIAGNOSIS — E785 Hyperlipidemia, unspecified: Secondary | ICD-10-CM | POA: Diagnosis not present

## 2022-01-13 DIAGNOSIS — N179 Acute kidney failure, unspecified: Secondary | ICD-10-CM

## 2022-01-13 DIAGNOSIS — N1831 Chronic kidney disease, stage 3a: Secondary | ICD-10-CM | POA: Diagnosis present

## 2022-01-13 DIAGNOSIS — G4733 Obstructive sleep apnea (adult) (pediatric): Secondary | ICD-10-CM

## 2022-01-13 DIAGNOSIS — I5033 Acute on chronic diastolic (congestive) heart failure: Secondary | ICD-10-CM | POA: Diagnosis present

## 2022-01-13 DIAGNOSIS — I10 Essential (primary) hypertension: Secondary | ICD-10-CM | POA: Diagnosis present

## 2022-01-13 DIAGNOSIS — D649 Anemia, unspecified: Secondary | ICD-10-CM

## 2022-01-13 DIAGNOSIS — Z800 Family history of malignant neoplasm of digestive organs: Secondary | ICD-10-CM

## 2022-01-13 DIAGNOSIS — I4891 Unspecified atrial fibrillation: Secondary | ICD-10-CM | POA: Diagnosis present

## 2022-01-13 DIAGNOSIS — Z823 Family history of stroke: Secondary | ICD-10-CM | POA: Diagnosis not present

## 2022-01-13 DIAGNOSIS — Z888 Allergy status to other drugs, medicaments and biological substances status: Secondary | ICD-10-CM | POA: Diagnosis not present

## 2022-01-13 DIAGNOSIS — I130 Hypertensive heart and chronic kidney disease with heart failure and stage 1 through stage 4 chronic kidney disease, or unspecified chronic kidney disease: Secondary | ICD-10-CM | POA: Diagnosis not present

## 2022-01-13 DIAGNOSIS — Z792 Long term (current) use of antibiotics: Secondary | ICD-10-CM

## 2022-01-13 DIAGNOSIS — I1A0 Resistant hypertension: Secondary | ICD-10-CM

## 2022-01-13 DIAGNOSIS — D631 Anemia in chronic kidney disease: Secondary | ICD-10-CM | POA: Diagnosis not present

## 2022-01-13 DIAGNOSIS — D500 Iron deficiency anemia secondary to blood loss (chronic): Secondary | ICD-10-CM | POA: Diagnosis not present

## 2022-01-13 DIAGNOSIS — I4819 Other persistent atrial fibrillation: Secondary | ICD-10-CM | POA: Diagnosis not present

## 2022-01-13 DIAGNOSIS — I5032 Chronic diastolic (congestive) heart failure: Secondary | ICD-10-CM | POA: Diagnosis not present

## 2022-01-13 DIAGNOSIS — K219 Gastro-esophageal reflux disease without esophagitis: Secondary | ICD-10-CM | POA: Diagnosis not present

## 2022-01-13 DIAGNOSIS — Z79899 Other long term (current) drug therapy: Secondary | ICD-10-CM | POA: Diagnosis not present

## 2022-01-13 DIAGNOSIS — E876 Hypokalemia: Secondary | ICD-10-CM | POA: Diagnosis not present

## 2022-01-13 DIAGNOSIS — Z885 Allergy status to narcotic agent status: Secondary | ICD-10-CM

## 2022-01-13 DIAGNOSIS — E1122 Type 2 diabetes mellitus with diabetic chronic kidney disease: Secondary | ICD-10-CM | POA: Diagnosis present

## 2022-01-13 DIAGNOSIS — Z7984 Long term (current) use of oral hypoglycemic drugs: Secondary | ICD-10-CM

## 2022-01-13 DIAGNOSIS — Z833 Family history of diabetes mellitus: Secondary | ICD-10-CM

## 2022-01-13 DIAGNOSIS — Z993 Dependence on wheelchair: Secondary | ICD-10-CM

## 2022-01-13 DIAGNOSIS — R600 Localized edema: Secondary | ICD-10-CM

## 2022-01-13 DIAGNOSIS — E119 Type 2 diabetes mellitus without complications: Secondary | ICD-10-CM

## 2022-01-13 DIAGNOSIS — Z8249 Family history of ischemic heart disease and other diseases of the circulatory system: Secondary | ICD-10-CM

## 2022-01-13 HISTORY — DX: Obstructive sleep apnea (adult) (pediatric): G47.33

## 2022-01-13 HISTORY — DX: Hypomagnesemia: E83.42

## 2022-01-13 HISTORY — DX: Localized edema: R60.0

## 2022-01-13 HISTORY — DX: Anemia, unspecified: D64.9

## 2022-01-13 HISTORY — DX: Acute kidney failure, unspecified: N17.9

## 2022-01-13 HISTORY — DX: Resistant hypertension: I1A.0

## 2022-01-13 LAB — POC OCCULT BLOOD, ED: Fecal Occult Bld: NEGATIVE

## 2022-01-13 LAB — URINALYSIS, ROUTINE W REFLEX MICROSCOPIC
Bacteria, UA: NONE SEEN
Bilirubin Urine: NEGATIVE
Glucose, UA: NEGATIVE mg/dL
Hgb urine dipstick: NEGATIVE
Ketones, ur: NEGATIVE mg/dL
Nitrite: NEGATIVE
Protein, ur: NEGATIVE mg/dL
Specific Gravity, Urine: 1.012 (ref 1.005–1.030)
pH: 7 (ref 5.0–8.0)

## 2022-01-13 LAB — CBC
HCT: 23.6 % — ABNORMAL LOW (ref 39.0–52.0)
HCT: 23.5 % — ABNORMAL LOW (ref 39.0–52.0)
Hemoglobin: 7.2 g/dL — ABNORMAL LOW (ref 13.0–17.0)
Hemoglobin: 7.2 g/dL — ABNORMAL LOW (ref 13.0–17.0)
MCH: 25.8 pg — ABNORMAL LOW (ref 26.0–34.0)
MCH: 25.4 pg — ABNORMAL LOW (ref 26.0–34.0)
MCHC: 30.5 g/dL (ref 30.0–36.0)
MCHC: 30.6 g/dL (ref 30.0–36.0)
MCV: 84.6 fL (ref 80.0–100.0)
MCV: 83 fL (ref 80.0–100.0)
Platelets: 329 10*3/uL (ref 150–400)
Platelets: 322 10*3/uL (ref 150–400)
RBC: 2.79 MIL/uL — ABNORMAL LOW (ref 4.22–5.81)
RBC: 2.83 MIL/uL — ABNORMAL LOW (ref 4.22–5.81)
RDW: 16.3 % — ABNORMAL HIGH (ref 11.5–15.5)
RDW: 16.1 % — ABNORMAL HIGH (ref 11.5–15.5)
WBC: 7.4 10*3/uL (ref 4.0–10.5)
WBC: 8.2 10*3/uL (ref 4.0–10.5)
nRBC: 0 % (ref 0.0–0.2)
nRBC: 0 % (ref 0.0–0.2)

## 2022-01-13 LAB — CBC WITH DIFFERENTIAL/PLATELET
Abs Immature Granulocytes: 0.03 10*3/uL (ref 0.00–0.07)
Basophils Absolute: 0.1 10*3/uL (ref 0.0–0.1)
Basophils Relative: 1 %
Eosinophils Absolute: 0.2 10*3/uL (ref 0.0–0.5)
Eosinophils Relative: 2 %
HCT: 24.9 % — ABNORMAL LOW (ref 39.0–52.0)
Hemoglobin: 7.6 g/dL — ABNORMAL LOW (ref 13.0–17.0)
Immature Granulocytes: 0 %
Lymphocytes Relative: 18 %
Lymphs Abs: 1.3 10*3/uL (ref 0.7–4.0)
MCH: 25.7 pg — ABNORMAL LOW (ref 26.0–34.0)
MCHC: 30.5 g/dL (ref 30.0–36.0)
MCV: 84.1 fL (ref 80.0–100.0)
Monocytes Absolute: 0.7 10*3/uL (ref 0.1–1.0)
Monocytes Relative: 10 %
Neutro Abs: 5.3 10*3/uL (ref 1.7–7.7)
Neutrophils Relative %: 69 %
Platelets: 330 10*3/uL (ref 150–400)
RBC: 2.96 MIL/uL — ABNORMAL LOW (ref 4.22–5.81)
RDW: 16.2 % — ABNORMAL HIGH (ref 11.5–15.5)
WBC: 7.6 10*3/uL (ref 4.0–10.5)
nRBC: 0 % (ref 0.0–0.2)

## 2022-01-13 LAB — CBG MONITORING, ED: Glucose-Capillary: 143 mg/dL — ABNORMAL HIGH (ref 70–99)

## 2022-01-13 LAB — GLUCOSE, CAPILLARY: Glucose-Capillary: 172 mg/dL — ABNORMAL HIGH (ref 70–99)

## 2022-01-13 LAB — BASIC METABOLIC PANEL
BUN/Creatinine Ratio: 11 (ref 10–24)
BUN: 17 mg/dL (ref 8–27)
CO2: 33 mmol/L — ABNORMAL HIGH (ref 20–29)
Calcium: 7.7 mg/dL — ABNORMAL LOW (ref 8.6–10.2)
Chloride: 94 mmol/L — ABNORMAL LOW (ref 96–106)
Creatinine, Ser: 1.51 mg/dL — ABNORMAL HIGH (ref 0.76–1.27)
Glucose: 152 mg/dL — ABNORMAL HIGH (ref 70–99)
Potassium: 3.8 mmol/L (ref 3.5–5.2)
Sodium: 140 mmol/L (ref 134–144)
eGFR: 49 mL/min/{1.73_m2} — ABNORMAL LOW (ref >59)

## 2022-01-13 LAB — PRO B NATRIURETIC PEPTIDE: NT-Pro BNP: 1979 pg/mL — ABNORMAL HIGH (ref 0–376)

## 2022-01-13 LAB — MAGNESIUM: Magnesium: 0.9 mg/dL — CL (ref 1.6–2.3)

## 2022-01-13 LAB — ABO/RH: ABO/RH(D): O POS

## 2022-01-13 MED ORDER — MAGNESIUM GLUCONATE 500 MG PO TABS
500.0000 mg | ORAL_TABLET | Freq: Every day | ORAL | Status: DC
Start: 2022-01-14 — End: 2022-01-15
  Administered 2022-01-14: 500 mg via ORAL
  Filled 2022-01-13 (×2): qty 1

## 2022-01-13 MED ORDER — LINAGLIPTIN 5 MG PO TABS
5.0000 mg | ORAL_TABLET | Freq: Every day | ORAL | Status: DC
Start: 2022-01-14 — End: 2022-01-15
  Administered 2022-01-14: 5 mg via ORAL
  Filled 2022-01-13: qty 1

## 2022-01-13 MED ORDER — TIMOLOL MALEATE 0.5 % OP SOLN
1.0000 [drp] | Freq: Every day | OPHTHALMIC | Status: DC
  Administered 2022-01-14: 1 [drp] via OPHTHALMIC
  Filled 2022-01-13: qty 5

## 2022-01-13 MED ORDER — ALLOPURINOL 100 MG PO TABS
100.0000 mg | ORAL_TABLET | Freq: Every morning | ORAL | Status: DC
  Administered 2022-01-14 – 2022-01-15 (×2): 100 mg via ORAL
  Filled 2022-01-13 (×2): qty 1

## 2022-01-13 MED ORDER — MAGNESIUM SULFATE 2 GM/50ML IV SOLN
2.0000 g | Freq: Once | INTRAVENOUS | Status: AC
  Administered 2022-01-13: 2 g via INTRAVENOUS
  Filled 2022-01-13: qty 50

## 2022-01-13 MED ORDER — HYDRALAZINE HCL 20 MG/ML IJ SOLN: 10.0000 mg | Freq: Three times a day (TID) | INTRAMUSCULAR | Status: DC | PRN

## 2022-01-13 MED ORDER — APIXABAN 5 MG PO TABS
5.0000 mg | ORAL_TABLET | Freq: Two times a day (BID) | ORAL | Status: DC
Start: 2022-01-13 — End: 2022-01-14
  Administered 2022-01-13: 5 mg via ORAL
  Filled 2022-01-13: qty 1

## 2022-01-13 MED ORDER — PANTOPRAZOLE SODIUM 40 MG PO TBEC
80.0000 mg | DELAYED_RELEASE_TABLET | Freq: Every day | ORAL | Status: DC
  Administered 2022-01-14: 80 mg via ORAL
  Filled 2022-01-13: qty 2

## 2022-01-13 MED ORDER — FERROUS SULFATE 325 (65 FE) MG PO TABS
325.0000 mg | ORAL_TABLET | Freq: Every evening | ORAL | Status: DC
  Administered 2022-01-13 – 2022-01-14 (×2): 325 mg via ORAL
  Filled 2022-01-13 (×2): qty 1

## 2022-01-13 MED ORDER — SODIUM CHLORIDE 0.9% FLUSH
3.0000 mL | Freq: Two times a day (BID) | INTRAVENOUS | Status: DC
  Administered 2022-01-13 – 2022-01-14 (×2): 3 mL via INTRAVENOUS

## 2022-01-13 MED ORDER — CARVEDILOL 25 MG PO TABS
25.0000 mg | ORAL_TABLET | Freq: Two times a day (BID) | ORAL | Status: DC
  Administered 2022-01-13 – 2022-01-14 (×3): 25 mg via ORAL
  Filled 2022-01-13 (×3): qty 1

## 2022-01-13 MED ORDER — INSULIN ASPART 100 UNIT/ML IJ SOLN
0.0000 [IU] | Freq: Three times a day (TID) | INTRAMUSCULAR | Status: DC
  Administered 2022-01-13: 1 [IU] via SUBCUTANEOUS
  Administered 2022-01-14 (×2): 2 [IU] via SUBCUTANEOUS
  Administered 2022-01-14 – 2022-01-15 (×2): 1 [IU] via SUBCUTANEOUS

## 2022-01-13 MED ORDER — ALBUTEROL SULFATE HFA 108 (90 BASE) MCG/ACT IN AERS
2.0000 | INHALATION_SPRAY | Freq: Four times a day (QID) | RESPIRATORY_TRACT | Status: DC | PRN
  Filled 2022-01-13: qty 6.7

## 2022-01-13 MED ORDER — TAMSULOSIN HCL 0.4 MG PO CAPS
0.4000 mg | ORAL_CAPSULE | Freq: Every day | ORAL | Status: DC
  Administered 2022-01-14: 0.4 mg via ORAL
  Filled 2022-01-13: qty 1

## 2022-01-13 MED ORDER — PRAVASTATIN SODIUM 40 MG PO TABS
40.0000 mg | ORAL_TABLET | Freq: Every day | ORAL | Status: DC
Start: 2022-01-13 — End: 2022-01-15
  Administered 2022-01-13 – 2022-01-14 (×2): 40 mg via ORAL
  Filled 2022-01-13 (×2): qty 1

## 2022-01-13 MED ORDER — ACETAMINOPHEN 325 MG PO TABS: 650.0000 mg | ORAL_TABLET | Freq: Four times a day (QID) | ORAL | Status: DC | PRN

## 2022-01-13 MED ORDER — ACETAMINOPHEN 650 MG RE SUPP: 650.0000 mg | Freq: Four times a day (QID) | RECTAL | Status: DC | PRN

## 2022-01-13 MED ORDER — CLONIDINE HCL 0.2 MG PO TABS
0.2000 mg | ORAL_TABLET | Freq: Two times a day (BID) | ORAL | Status: DC
  Administered 2022-01-13 – 2022-01-14 (×3): 0.2 mg via ORAL
  Filled 2022-01-13 (×3): qty 1

## 2022-01-13 MED ORDER — SODIUM CHLORIDE 0.9 % IV SOLN: 250.0000 mL | INTRAVENOUS | Status: DC | PRN

## 2022-01-13 MED ORDER — SODIUM CHLORIDE 0.9% FLUSH: 3.0000 mL | INTRAVENOUS | Status: DC | PRN

## 2022-01-13 NOTE — Assessment & Plan Note (Signed)
Continue lovastatin 

## 2022-01-13 NOTE — ED Notes (Signed)
This tech confirmed with 58M Secretary Anthony Mueller that patients room is ready, patient transported upstairs. ?

## 2022-01-13 NOTE — Progress Notes (Addendum)
NEW ADMISSION NOTE ?New Admission Note:  ? ?Arrival Method: stretcher ?Mental Orientation: A&OX4 ?Telemetry: 5M16 ?Assessment: Completed ?Skin: intact bruising and abrasions bilateral  ?IV:RAC ?Pain:0/10 ?Tubes:NONE ?Safety Measures: Safety Fall Prevention Plan has been given, discussed and signed ?Admission: Completed ?[facility] Orientation: Patient has been orientated to the room, unit and staff.  ?Family:wife at bedside  ? ?Orders have been reviewed and implemented. Will continue to monitor the patient. Call light has been placed within reach and bed alarm has been activated.  ? ?Tiffiny Worthy S Vedh Ptacek, RN   ?

## 2022-01-13 NOTE — ED Provider Notes (Signed)
?[facility] ?Provider Note ? ? ?CSN: [id number] ?Arrival date & time: 01/13/22  1145 ? ?  ? ?History ? ?Chief Complaint  ?Patient presents with  ? Abnormal Lab  ? ? ?Anthony Mueller is a 71 y.o. male. ? ?Patient presents for abnormal labs that were drawn yesterday.  Patient was called by cardiologist requesting to come in as magnesium was dangerously low.  Patient denies any new symptoms.  Minimal fatigue, denies chest pain, shortness of breath, headache, fevers, chills, vomiting, dizziness.  Patient has been on furosemide twice a day 20 mg since he had a procedure for a large hemorrhoid removed by surgery.  Patient has improved since that surgery.  During that procedure patient was given fluids and became volume up and per his report that is what led to needing torsemide dosing.  Denies active bleeding. ? ? ?  ? ?Home Medications ?Prior to Admission medications   ?Medication Sig Start Date End Date Taking? Authorizing Provider  ?albuterol (VENTOLIN HFA) 108 (90 Base) MCG/ACT inhaler Inhale 2 puffs into the lungs every 6 (six) hours as needed for wheezing or shortness of breath. 11/16/21   Rai, Vernelle Emerald, MD  ?allopurinol (ZYLOPRIM) 100 MG tablet Take 100 mg by mouth in the morning.    [provider]  ?apixaban (ELIQUIS) 5 MG TABS tablet Take 1 tablet (5 mg total) by mouth 2 (two) times daily. 11/16/21   Rai, Vernelle Emerald, MD  ?ascorbic acid (VITAMIN C) 500 MG tablet Take 1 tablet (500 mg total) by mouth daily. 11/17/21   Rai, Vernelle Emerald, MD  ?candesartan (ATACAND) 32 MG tablet Take 32 mg by mouth daily. 09/13/21   [provider]  ?carvedilol (COREG) 25 MG tablet Take 25 mg by mouth 2 (two) times daily. 10/19/21   [provider]  ?cloNIDine (CATAPRES) 0.2 MG tablet Take 0.2 mg by mouth 2 (two) times daily. 10/28/21   [provider]  ?Ferrous Sulfate (IRON SLOW RELEASE) 143 (45 Fe) MG TBCR Take 143 mg by mouth every evening.    [provider]  ?lovastatin (MEVACOR) 40 MG tablet Take 40 mg by mouth every evening. 09/20/21   [provider]  ?metFORMIN (GLUCOPHAGE) 500 MG tablet Take 1,000 mg by mouth 2 (two) times daily. 08/17/21   [provider]  ?Multiple Vitamins-Minerals (CENTRUM SILVER 50+MEN PO) Take 1 tablet by mouth daily.    [provider]  ?nystatin (MYCOSTATIN/NYSTOP) powder Apply 1 application. topically daily as needed (Rash).    [provider]  ?omeprazole (PRILOSEC) 20 MG capsule Take 40 mg by mouth daily. 11/04/21   [provider]  ?oxaprozin (DAYPRO) 600 MG tablet Take 1,200 mg by mouth daily as needed (Gout).    [provider]  ?tamsulosin (FLOMAX) 0.4 MG CAPS capsule Take 0.4 mg by mouth daily. 08/30/21   [provider]  ?timolol (TIMOPTIC) 0.5 % ophthalmic solution Place 1 drop into both eyes daily. 09/13/21   [provider]  ?torsemide (DEMADEX) 20 MG tablet Take 1 tablet (20 mg total) by mouth 2 (two) times daily. 01/05/22 04/05/22  Richardo Priest, MD  ?TRADJENTA 5 MG TABS tablet Take 5 mg by mouth daily. 09/20/21   [provider]  ?zinc sulfate 220 (50 Zn) MG capsule Take 1 capsule (220 mg total) by mouth daily. 11/17/21   Mendel Corning, MD  ?   ? ?Allergies    ?Liraglutide, Hydrocodone, Hydrocodone-acetaminophen, Other, Tape, Latex, and Metronidazole   ? ?  Review of Systems   ?Review of Systems  ?Constitutional:  Positive for fatigue. Negative for chills and fever.  ?HENT:  Negative for congestion.   ?Eyes:  Negative for visual disturbance.  ?Respiratory:  Negative for shortness of breath.   ?Cardiovascular:  Negative for chest pain.  ?Gastrointestinal:  Negative for abdominal pain and vomiting.  ?Genitourinary:  Negative for dysuria and flank pain.  ?Musculoskeletal:  Negative for back pain, neck pain and neck stiffness.  ?Skin:  Negative for rash.  ?Neurological:  Negative for light-headedness and headaches.  ? ?Physical  Exam ?Updated Vital Signs ?BP (!) 160/97   Pulse (!) 56   Temp 99.8 ?F (37.7 ?C) (Oral)   Resp 13   Ht '6\' 1"'$  (1.854 m)   Wt 111.1 kg   SpO2 97%   BMI 32.32 kg/m?  ?Physical Exam ?Vitals and nursing note reviewed.  ?Constitutional:   ?   General: He is not in acute distress. ?   Appearance: He is well-developed.  ?HENT:  ?   Head: Normocephalic and atraumatic.  ?   Mouth/Throat:  ?   Mouth: Mucous membranes are moist.  ?Eyes:  ?   General:     ?   Right eye: No discharge.     ?   Left eye: No discharge.  ?   Conjunctiva/sclera: Conjunctivae normal.  ?Neck:  ?   Trachea: No tracheal deviation.  ?Cardiovascular:  ?   Rate and Rhythm: Normal rate.  ?Pulmonary:  ?   Effort: Pulmonary effort is normal.  ?Abdominal:  ?   General: There is no distension.  ?   Palpations: Abdomen is soft.  ?   Tenderness: There is no abdominal tenderness. There is no guarding.  ?Musculoskeletal:  ?   Cervical back: Normal range of motion and neck supple. No rigidity.  ?Skin: ?   General: Skin is warm.  ?   Capillary Refill: Capillary refill takes less than 2 seconds.  ?   Coloration: Skin is pale.  ?   Findings: No rash.  ?Neurological:  ?   General: No focal deficit present.  ?   Mental Status: He is alert.  ?   Cranial Nerves: No cranial nerve deficit.  ?Psychiatric:     ?   Mood and Affect: Mood normal.  ? ? ?ED Results / Procedures / Treatments   ?Labs ?(all labs ordered are listed, but only abnormal results are displayed) ?Labs Reviewed  ?CBC WITH DIFFERENTIAL/PLATELET - Abnormal; Notable for the following components:  ?    Result Value  ? RBC 2.96 (*)   ? Hemoglobin 7.6 (*)   ? HCT 24.9 (*)   ? MCH 25.7 (*)   ? RDW 16.2 (*)   ? All other components within normal limits  ?POC OCCULT BLOOD, ED  ? ? ?EKG ?EKG Interpretation ? ?Date/Time:  Thursday January 13 2022 11:52:56 EDT ?Ventricular Rate:  80 ?PR Interval:    ?QRS Duration: 93 ?QT Interval:  411 ?QTC Calculation: 475 ?R Axis:   79 ?Text Interpretation: Atrial fibrillation  Borderline repolarization abnormality Confirmed by Elnora Morrison [phone]) on 01/13/2022 12:02:56 PM ? ?Radiology ?No results found. ? ?Procedures ?Procedures  ? ? ?Medications Ordered in ED ?Medications  ?magnesium sulfate IVPB 2 g 50 mL (2 g Intravenous New Bag/Given 01/13/22 1259)  ? ? ?ED Course/ Medical Decision Making/ A&P ?  ?                        ?  Medical Decision Making ?Amount and/or Complexity of Data Reviewed ?Labs: ordered. ?Radiology: ordered. ? ?Risk ?Prescription drug management. ?Decision regarding hospitalization. ? ? ?Patient presents for evaluation of abnormal labs as drawn yesterday, reviewed medical records on March 15 patient magnesium result 0.9.  Patient follows with Dr. Bettina Gavia cardiology.  CBC added, patient had BNP which was reviewed yesterday showing greater than 1000. ? ?Magnesium 2 g ordered.  Cardiology paged for further discussion.  CBC returned showing hemoglobin 7.6, previous result 8.3 on 2/21 at [facility] and 11.3 11/11/2021 on medical record review care everywhere to look at [facility] with procedure recently.  Last colonoscopy August 2022 and per care everywhere note at [facility] had polyps and anal mass which led to referral for procedure. ? ?Hemocult ordered.  Patient is on Eliquis.  Hemoccult negative brown stool. ?With patient on anticoagulant, symptomatic anemia and hypomagnesia anemia discussed with hospitalist for admission who agrees.  Cardiology repaged.  Discussed with patient is comfortable with this plan.  Discussed with family in the room as well. ? ? ? ? ? ? ? ?Final Clinical Impression(s) / ED Diagnoses ?Final diagnoses:  ?Hypomagnesemia  ?Symptomatic anemia  ? ? ?Rx / DC Orders ?ED Discharge Orders   ? ? None  ? ?  ? ? ?  ?Elnora Morrison, MD ?01/13/22 1524 ? ?

## 2022-01-13 NOTE — Assessment & Plan Note (Signed)
Critically low at .9 ?repleted x4g total in ED ?Place on telemetry ?Repeat levels in AM  ?

## 2022-01-13 NOTE — Assessment & Plan Note (Signed)
cpap at night  

## 2022-01-13 NOTE — Assessment & Plan Note (Signed)
Baseline creatinine appears to be around 1.0-1.1 ?1.5 today and likely secondary to increased diuretics/anemia ?-will hold ARB and defer diuresis to cardiology  ?-strict I/O ?-UA ordered ?-trend bmp  ?

## 2022-01-13 NOTE — Assessment & Plan Note (Signed)
Recently diagnosed in 10/2021 and follows with Dr. Bettina Gavia  ?Currently AC with eliquis ?Continue coreg and telemetry  ?

## 2022-01-13 NOTE — H&P (Addendum)
History and Physical    Patient: Anthony Mueller ZOX:[id number] DOB: April 26, 1951 DOA: 01/13/2022 DOS: the patient was seen and examined on 01/13/2022 PCP: Everlean Cherry, MD  Patient coming from: Home - lives with wife. WC bound.    Chief Complaint: "abnormal labs"  HPI: Anthony Mueller is a 71 y.o. 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 He states she has been feeling fine. He has had a few episodes of dizziness when sitting up in bed, no lightheadedness. He is tired, but states he is no more tired than he usually is. No weakness from baseline. He has some shortness of breath intermittently.   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 Had some bleeding associated with the mass. Last colonoscopy in 05/2021 showed benign polys and the anal mass. Per surgery note, uncomplicated procedure. He has had just a little bit of bleeding about 3-4 days ago and when he stood up he had a little bit bleeding. Sometimes his stool is dark, but typically a brown color. No blood in the stool.    On 01/05/22 he saw his cardiologist and had his CCB stopped due to peripheral edema. Also transitioned from furosemide to torsemide 20mg  BID. He states he has been urinating every 10-15 minutes and the swelling in his legs is much improved. He is unsure about weight as it's hard to weigh him. He denies any coughing. Does endorse orthopnea since 12/30/21.   He denies any fever/chills, no headaches or vision changes, no chest pain or palpitations, no stomach pain, no N/V/D, no dysuria, +leg swelling but this has improved.   ER Course:  vitals: temp: 99.8, bp: 180/116, HR: 78, RR: 16, oxygen: 97%RA Pertinent labs: (yesterday at office: creatinine: 1.51, bnp: 1979, magnesium: 0.9) Hgb: 7.6 (9-12), fecal occult negative (8.3 three weeks ago)  CXR: cardiomegaly,  haziness in right lower lung fields may suggest layering of small right pleural effusion.  In Ed: replaced magnesium 2g x 2, cardiology consulted     Review of Systems: As mentioned in the history of present illness. All other systems reviewed and are negative. Past Medical History:  Diagnosis Date   Cerebellar degeneration (HCC)    Diabetes mellitus without complication (HCC)    Hyperlipidemia    Hypertension    Past Surgical History:  Procedure Laterality Date   APPENDECTOMY     CATARACT EXTRACTION, BILATERAL     HEMORRHOID SURGERY     Social History:  reports that he has never smoked. He has never been exposed to tobacco smoke. He has never used smokeless tobacco. He reports that he does not currently use alcohol. He reports that he does not use drugs.  Allergies  Allergen Reactions   Liraglutide     Other reaction(s): Other (See Comments) CAN NOT TAKE BECAUSE A SIDE EFFECT IS PANCREATITIS AND HE HAS A HISTORY Contraindicated due to pancreatitis    Hydrocodone     Other reaction(s): Other (See Comments) MAKES FEEL SPACEY AND LIGHT HEADED   Hydrocodone-Acetaminophen     Other reaction(s): Other (See Comments) Feels " orbity"   Other     Other reaction(s): Other (See Comments) BANDAID  CAUSES RASH   Tape Other (See Comments)    Skin irritation, welts   Latex Rash    Band-aids   Metronidazole Nausea And Vomiting    Other reaction(s): GI Upset (  intolerance)  AND DEPRESSION ;  TABLETS Does fine w/ IV Flagyl     Family History  Problem Relation Age of Onset   Stroke Mother    Hypertension Mother    Diabetes Mother    Hyperlipidemia Mother    Colon cancer Father    Healthy Sister     Prior to Admission medications   Medication Sig Start Date End Date Taking? Authorizing Provider  albuterol (VENTOLIN HFA) 108 (90 Base) MCG/ACT inhaler Inhale 2 puffs into the lungs every 6 (six) hours as needed for wheezing or shortness of breath. 11/16/21   Rai, Delene Ruffini, MD   allopurinol (ZYLOPRIM) 100 MG tablet Take 100 mg by mouth in the morning.    [provider]  apixaban (ELIQUIS) 5 MG TABS tablet Take 1 tablet (5 mg total) by mouth 2 (two) times daily. 11/16/21   Rai, Delene Ruffini, MD  ascorbic acid (VITAMIN C) 500 MG tablet Take 1 tablet (500 mg total) by mouth daily. 11/17/21   Rai, Ripudeep Kirtland Bouchard, MD  candesartan (ATACAND) 32 MG tablet Take 32 mg by mouth daily. 09/13/21   [provider]  carvedilol (COREG) 25 MG tablet Take 25 mg by mouth 2 (two) times daily. 10/19/21   [provider]  cloNIDine (CATAPRES) 0.2 MG tablet Take 0.2 mg by mouth 2 (two) times daily. 10/28/21   [provider]  Ferrous Sulfate (IRON SLOW RELEASE) 143 (45 Fe) MG TBCR Take 143 mg by mouth every evening.    [provider]  lovastatin (MEVACOR) 40 MG tablet Take 40 mg by mouth every evening. 09/20/21   [provider]  metFORMIN (GLUCOPHAGE) 500 MG tablet Take 1,000 mg by mouth 2 (two) times daily. 08/17/21   [provider]  Multiple Vitamins-Minerals (CENTRUM SILVER 50+MEN PO) Take 1 tablet by mouth daily.    [provider]  nystatin (MYCOSTATIN/NYSTOP) powder Apply 1 application. topically daily as needed (Rash).    [provider]  omeprazole (PRILOSEC) 20 MG capsule Take 40 mg by mouth daily. 11/04/21   [provider]  oxaprozin (DAYPRO) 600 MG tablet Take 1,200 mg by mouth daily as needed (Gout).    [provider]  tamsulosin (FLOMAX) 0.4 MG CAPS capsule Take 0.4 mg by mouth daily. 08/30/21   [provider]  timolol (TIMOPTIC) 0.5 % ophthalmic solution Place 1 drop into both eyes daily. 09/13/21   [provider]  torsemide (DEMADEX) 20 MG tablet Take 1 tablet (20 mg total) by mouth 2 (two) times daily. 01/05/22 04/05/22  Baldo Daub, MD  TRADJENTA 5 MG TABS tablet Take 5 mg by mouth daily. 09/20/21   [provider]  zinc sulfate 220 (50 Zn) MG capsule Take  1 capsule (220 mg total) by mouth daily. 11/17/21   Cathren Harsh, MD    Physical Exam: Vitals:   01/13/22 1400 01/13/22 1430 01/13/22 1630 01/13/22 1737  BP: (!) 152/81 (!) 164/90 (!) 170/92 (!) 167/96  Pulse: (!) 55 65 69 77  Resp: 10 10 18 20   Temp:    98.8 F (37.1 C)  TempSrc:    Oral  SpO2: 98% 96% 94% 94%  Weight:    108.2 kg  Height:    6\' 1"  (1.854 m)   General:  Appears calm and comfortable and is in NAD Eyes:  PERRL, EOMI, normal lids, iris. Disconjugate gaze.  ENT:  grossly normal hearing, lips & tongue, mmm; appropriate dentition Neck:  no LAD, masses or  thyromegaly; no carotid bruits Cardiovascular:  irregularly, irregular no m/r/g. BLE edema, legs wrapped.  Respiratory:   Right lower lobe with crackles, otherwise normal exam Normal respiratory effort. Abdomen:  soft, NT, ND, NABS Back:   normal alignment, no CVAT Skin:  no rash or induration seen on limited exam GU: no gross blood around anus. Surgical suture hanging down  Musculoskeletal:  no bony abnormality.  Lower extremity:  Limited foot exam with no ulcerations.  2+ distal pulses. Psychiatric:  grossly normal mood and affect, speech fluent and appropriate, AOx3 Neurologic:  CN 2-12 grossly intact, moves all extremities in coordinated fashion, sensation intact   Radiological Exams on Admission: Independently reviewed - see discussion in A/P where applicable  DG Chest Portable 1 View  Result Date: 01/13/2022 CLINICAL DATA:  Difficulty breathing EXAM: PORTABLE CHEST 1 VIEW COMPARISON:  11/10/2021 FINDINGS: Transverse diameter of heart is increased. There is prominence of retrocardiac soft tissue densities, possibly suggesting fixed hiatal hernia. There is haziness in the right lower lung fields. There are no signs of alveolar pulmonary edema or focal pulmonary consolidation. There is no pneumothorax. IMPRESSION: Cardiomegaly. Haziness in the right lower lung fields may suggest layering of small right pleural  effusion. Electronically Signed   By: Ernie Avena M.D.   On: 01/13/2022 14:59    EKG: Independently reviewed.  Atrial fibrillation with rate 80; nonspecific ST changes with no evidence of acute ischemia   Labs on Admission: I have personally reviewed the available labs and imaging studies at the time of the admission.  Pertinent labs:    (yesterday at office: creatinine: 1.51, bnp: 1979, magnesium: 0.9) Hgb: 7.6 (9-12), fecal occult negative (8.3 three weeks ago)    Assessment and Plan: * Acute on chronic anemia 71 year old male presenting for abnormal labs but found to have hgb down to 7.6.  -place in observation on telemetry -fecal occult negative -will trend cbc, transfuse if hgb <7 -continue oral iron, has not been taking  -has had a few episodes of dizziness, but otherwise feels fine -recent hemorrhoidectomy on 12/30/21 with hgb of 8.3 on 12/21/21. Received iron infusion 12/23/21.  -on eliquis, denies any active bleeding  History of anemia due to chronic blood loss with GI work ups including capsule study (no findings per patient), colonoscopy in 05/2021 with benign polyps and EGD five years ago and was wnl. Follows with GI doctor in [facility]: Dr. Dorna Leitz -likely secondary to surgery with hgb 8.3>7.6. discussed with on call GI who agreed and recommended they call their GI doc tomorrow. Did not advise to stop eliquis.   Hypomagnesemia Critically low at .9 repleted x4g total in ED Place on telemetry Repeat levels in AM   AKI (acute kidney injury) (HCC) Baseline creatinine appears to be around 1.0-1.1 1.5 today and likely secondary to increased diuretics/anemia -will hold ARB and defer diuresis to cardiology  -strict I/O -UA ordered -trend bmp   Bilateral lower extremity edema No history of CHF and recent echo 11/22/21 showed normal EF and normal diastolic parameters CCB stopped on 01/05/22 at cardiology visit Started on torsemide BID with great improvement in  his swelling bnp is elevated and CXR shows no edema, but small right pleural effusion He has no cough/shortness of breath but does endorse orthopnea Cardiology has been consulted in ED and will defer diuresis to them     Unspecified atrial fibrillation (HCC) with sinus pauses Recently diagnosed in 10/2021 and follows with Dr. Dulce Sellar  Currently [facility] with eliquis  Continue coreg and telemetry   Diabetes mellitus without complication (HCC) a1c of 7.3 in 10/2021 Hold metformin, continue tradjenta SSI and accuchecks per protocol   Hypertension Above goal Will continue home meds and add on hydralazine prn  Currently on clonidine .2mg  BID, coreg 25mg  BID and candesartan 32mg  daily  Holding candesartan with aKI  Continue to monitor   OSA (obstructive sleep apnea) cpap at night   Hyperlipidemia Continue lovastatin   GERD (gastroesophageal reflux disease) Continue prilosec   Cerebellar degeneration (HCC) WC bound, at baseline      Advance Care Planning:   Code Status: Full Code   Consults: cardiology by edp   DVT Prophylaxis: SCDs.   Family Communication: wife at bedside: barbara Bewick   Severity of Illness: The appropriate patient status for this patient is OBSERVATION. Observation status is judged to be reasonable and necessary in order to provide the required intensity of service to ensure the patient's safety. The patient's presenting symptoms, physical exam findings, and initial radiographic and laboratory data in the context of their medical condition is felt to place them at decreased risk for further clinical deterioration. Furthermore, it is anticipated that the patient will be medically stable for discharge from the hospital within 2 midnights of admission.   Author: Orland Mustard, MD 01/13/2022 7:10 PM  For on call review [email or web address].

## 2022-01-13 NOTE — Assessment & Plan Note (Signed)
Continue prilosec

## 2022-01-13 NOTE — Telephone Encounter (Signed)
Received a message that patient had a critical magnesium value from [facility]. Dr. Bettina Gavia had been trying to call the patient and was unable to get a hold of the patient so he asked that we try to call him. We called patient and informed him of the critical magnesium level and that Dr. Bettina Gavia wanted him to go to the ER. Patient was agreeable to go to the ER and they were driving themselves to [facility] ER. Dr. Bettina Gavia also asked that we call the ER to inform them that the patient was on their way. I called the ER and spoke to [employer] and let her know that the patient was on their way and gave her all of the patient information needed. ?

## 2022-01-13 NOTE — Assessment & Plan Note (Signed)
No history of CHF and recent echo 11/22/21 showed normal EF and normal diastolic parameters ?CCB stopped on 01/05/22 at cardiology visit ?Started on torsemide BID with great improvement in his swelling ?bnp is elevated and CXR shows no edema, but small right pleural effusion ?He has no cough/shortness of breath but does endorse orthopnea ?Cardiology has been consulted in ED and will defer diuresis to them  ? ? ?

## 2022-01-13 NOTE — Consult Note (Signed)
?Cardiology Consultation:  ? ?Patient ID: Anthony Mueller ?MRN: [id number]; DOB: Jul 28, 1951 ? ?Admit date: 01/13/2022 ?Date of Consult: 01/13/2022 ? ?PCP:  Maris Berger, MD ?  ?[facility] Providers ?Cardiologist:  Shirlee More, MD   { ? ? ?Patient Profile:  ? ?Anthony Mueller is a 71 y.o. male with a hx of persistent atrial fibrillation with pauses, chronic diastolic heart failure, hypertension, hyperlipidemia, DM, obstructive sleep apnea, anemia, and Cerebellar degeneration who is being seen 01/13/2022 for the evaluation of CHF at the request of Dr. Rogers Blocker. ? ?He was recently admitted to [facility] 11/10/2021-11/16/2021 with acute respiratory failure following COVID-19 infection.  He was hypoxic was treated with steroids and received IV Lasix but he does not have a diagnosis of heart failure.  He has noted to be in atrial fibrillation with pauses.  He was anticoagulated and continued on a beta-blocker. ? ?His event monitor was reported 12/10/2021 with continuous atrial fibrillation throughout 14 episodes of a pauses the longest 3.5 seconds while the ventricular ectopy was occasional he had 11 episodes of ventricular tachycardia the longest 15 complexes at a slow rate of 103 bpm.  Patient was asymptomatic.  Continued beta-blocker. ? ?Patient also with history of rectal prolapse and underwent transanal excision with hemorrhoidectomy on 12/30/2021.  He held Eliquis 4 days preoperatively and 3 days postoperatively.  Patient reports receiving iron infusion prior to surgery. ? ?His hemoglobin was 8.3 on 12/21/2021. ? ?Establish care with Dr. Bettina Gavia 01/05/2022.  Noted volume overload on exam.  Discontinued amlodipine.  Change Lasix to torsemide. ? ?History of Present Illness:  ? ?Anthony Mueller had routine lab work (ordered by Dr. Bettina Gavia after diuretic change) showing low magnesium at 0.9.  Creatinine 1.51 (baseline 1.1) proBNP elevated at 1979.  He was advised to come to ER. ? ?Today patient reports improving lower  extremity edema since being on torsemide.  Intermittent orthopnea which is improving.  Since his anal surgery he has mild mucus discharge.  Denies heavy blood clots.  No chest pain, shortness of breath, dizziness, palpitation or syncope. ? ?Hemoglobin 7.2.  Stool guaiac negative.  Chest x-ray with cardiomegaly and small right pleural effusion. ? ? ?Past Medical History:  ?Diagnosis Date  ? Cerebellar degeneration ([place])   ? Diabetes mellitus without complication ([place])   ? Hyperlipidemia   ? Hypertension   ? ? ?Past Surgical History:  ?Procedure Laterality Date  ? APPENDECTOMY    ? CATARACT EXTRACTION, BILATERAL    ? HEMORRHOID SURGERY    ?  ? ?Home Medications:  ?Prior to Admission medications   ?Medication Sig Start Date End Date Taking? Authorizing Provider  ?albuterol (VENTOLIN HFA) 108 (90 Base) MCG/ACT inhaler Inhale 2 puffs into the lungs every 6 (six) hours as needed for wheezing or shortness of breath. 11/16/21  Yes Rai, Ripudeep K, MD  ?allopurinol (ZYLOPRIM) 100 MG tablet Take 100 mg by mouth in the morning.   Yes [provider]  ?apixaban (ELIQUIS) 5 MG TABS tablet Take 1 tablet (5 mg total) by mouth 2 (two) times daily. 11/16/21  Yes Rai, Ripudeep K, MD  ?ascorbic acid (VITAMIN C) 500 MG tablet Take 1 tablet (500 mg total) by mouth daily. 11/17/21  Yes Rai, Ripudeep K, MD  ?candesartan (ATACAND) 32 MG tablet Take 32 mg by mouth daily. 09/13/21  Yes [provider]  ?carvedilol (COREG) 25 MG tablet Take 25 mg by mouth 2 (two) times daily. 10/19/21  Yes [provider]  ?cloNIDine (CATAPRES) 0.2 MG tablet  Take 0.2 mg by mouth 2 (two) times daily. 10/28/21  Yes [provider]  ?Ferrous Sulfate (IRON SLOW RELEASE) 143 (45 Fe) MG TBCR Take 143 mg by mouth every evening.   Yes [provider]  ?lovastatin (MEVACOR) 40 MG tablet Take 40 mg by mouth every evening. 09/20/21  Yes [provider]  ?metFORMIN (GLUCOPHAGE) 500 MG tablet Take 1,000 mg by mouth 2  (two) times daily. 08/17/21  Yes [provider]  ?omeprazole (PRILOSEC) 20 MG capsule Take 40 mg by mouth daily. 11/04/21  Yes [provider]  ?oxaprozin (DAYPRO) 600 MG tablet Take 1,200 mg by mouth daily as needed (Gout).   Yes [provider]  ?tamsulosin (FLOMAX) 0.4 MG CAPS capsule Take 0.4 mg by mouth daily. 08/30/21  Yes [provider]  ?timolol (TIMOPTIC) 0.5 % ophthalmic solution Place 1 drop into both eyes daily. 09/13/21  Yes [provider]  ?torsemide (DEMADEX) 20 MG tablet Take 1 tablet (20 mg total) by mouth 2 (two) times daily. 01/05/22 04/05/22 Yes Richardo Priest, MD  ?TRADJENTA 5 MG TABS tablet Take 5 mg by mouth daily. 09/20/21  Yes [provider]  ?zinc sulfate 220 (50 Zn) MG capsule Take 1 capsule (220 mg total) by mouth daily. 11/17/21  Yes Rai, Vernelle Emerald, MD  ? ? ?Inpatient Medications: ?Scheduled Meds: ? [START ON 01/14/2022] allopurinol  100 mg Oral q AM  ? apixaban  5 mg Oral BID  ? carvedilol  25 mg Oral BID  ? cloNIDine  0.2 mg Oral BID  ? ferrous sulfate  325 mg Oral QPM  ? insulin aspart  0-9 Units Subcutaneous TID WC  ? [START ON 01/14/2022] linagliptin  5 mg Oral Daily  ? [START ON 01/14/2022] pantoprazole  80 mg Oral Daily  ? pravastatin  40 mg Oral q1800  ? sodium chloride flush  3 mL Intravenous Q12H  ? [START ON 01/14/2022] tamsulosin  0.4 mg Oral Daily  ? [START ON 01/14/2022] timolol  1 drop Both Eyes Daily  ? ?Continuous Infusions: ? sodium chloride    ? ?PRN Meds: ?sodium chloride, acetaminophen **OR** acetaminophen, albuterol, hydrALAZINE, sodium chloride flush ? ?Allergies:    ?Allergies  ?Allergen Reactions  ? Liraglutide   ?  Other reaction(s): Other (See Comments) ?CAN NOT TAKE BECAUSE A SIDE EFFECT IS PANCREATITIS AND HE HAS A HISTORY ?Contraindicated due to pancreatitis ?  ? Hydrocodone   ?  Other reaction(s): Other (See Comments) ?MAKES FEEL SPACEY AND LIGHT HEADED  ? Hydrocodone-Acetaminophen   ?  Other reaction(s):  Other (See Comments) ?Feels " orbity"  ? Other   ?  Other reaction(s): Other (See Comments) ?BANDAID  CAUSES RASH  ? Tape Other (See Comments)  ?  Skin irritation, welts  ? Latex Rash  ?  Band-aids  ? Metronidazole Nausea And Vomiting  ?  Other reaction(s): GI Upset (intolerance) ? AND DEPRESSION ;  TABLETS ?Does fine w/ IV Flagyl ?  ? ? ?Social History:   ?Social History  ? ?Socioeconomic History  ? Marital status: Married  ?  Spouse name: Not on file  ? Number of children: Not on file  ? Years of education: Not on file  ? Highest education level: Not on file  ?Occupational History  ? Not on file  ?Tobacco Use  ? Smoking status: Never  ?  Passive exposure: Never  ? Smokeless tobacco: Never  ?Vaping Use  ? Vaping Use: Never used  ?Substance and Sexual Activity  ?  Alcohol use: Not Currently  ? Drug use: Never  ? Sexual activity: Not on file  ?Other Topics Concern  ? Not on file  ?Social History Narrative  ? Not on file  ? ?Social Determinants of Health  ? ?Financial Resource Strain: Not on file  ?Food Insecurity: Not on file  ?Transportation Needs: Not on file  ?Physical Activity: Not on file  ?Stress: Not on file  ?Social Connections: Not on file  ?Intimate Partner Violence: Not on file  ?  ?Family History:   ? ?Family History  ?Problem Relation Age of Onset  ? Stroke Mother   ? Hypertension Mother   ? Diabetes Mother   ? Hyperlipidemia Mother   ? Colon cancer Father   ? Healthy Sister   ?  ? ?ROS:  ?Please see the history of present illness.  ?All other ROS reviewed and negative.    ? ?Physical Exam/Data:  ? ?Vitals:  ? 01/13/22 1400 01/13/22 1430 01/13/22 1630 01/13/22 1737  ?BP: (!) 152/81 (!) 164/90 (!) 170/92 (!) 167/96  ?Pulse: (!) 55 65 69 77  ?Resp: '10 10 18 20  '$ ?Temp:    98.8 ?F (37.1 ?C)  ?TempSrc:    Oral  ?SpO2: 98% 96% 94% 94%  ?Weight:    108.2 kg  ?Height:    '6\' 1"'$  (1.854 m)  ? ? ?Intake/Output Summary (Last 24 hours) at 01/13/2022 1750 ?Last data filed at 01/13/2022 1613 ?Gross per 24 hour  ?Intake 100  ml  ?Output --  ?Net 100 ml  ? ?Last 3 Weights 01/13/2022 01/13/2022 01/05/2022  ?Weight (lbs) 238 lb 8.6 oz 245 lb 245 lb  ?Weight (kg) 108.2 kg 111.131 kg 111.131 kg  ?   ?Body mass index is 31.47 kg/m?.  ?General:

## 2022-01-13 NOTE — Assessment & Plan Note (Addendum)
71 year old male presenting for abnormal labs but found to have hgb down to 7.6.  ?-place in observation on telemetry ?-fecal occult negative ?-will trend cbc, transfuse if hgb <7 ?-continue oral iron, has not been taking  ?-has had a few episodes of dizziness, but otherwise feels fine ?-recent hemorrhoidectomy on 12/30/21 with hgb of 8.3 on 12/21/21. Received iron infusion 12/23/21.  ?-on eliquis, denies any active bleeding  ?History of anemia due to chronic blood loss with GI work ups including capsule study (no findings per patient), colonoscopy in 05/2021 with benign polyps and EGD five years ago and was wnl. Follows with GI doctor in [facility]: Dr. Margaretmary Bayley ?-likely secondary to surgery with hgb 8.3>7.6. discussed with on call GI who agreed and recommended they call their GI doc tomorrow. Did not advise to stop eliquis.  ?

## 2022-01-13 NOTE — Assessment & Plan Note (Signed)
WC bound, at baseline  ? ? ?

## 2022-01-13 NOTE — Assessment & Plan Note (Signed)
a1c of 7.3 in 10/2021 ?Hold metformin, continue tradjenta ?SSI and accuchecks per protocol  ?

## 2022-01-13 NOTE — Assessment & Plan Note (Addendum)
Above goal ?Will continue home meds and add on hydralazine prn  ?Currently on clonidine .'2mg'$  BID, coreg '25mg'$  BID and candesartan '32mg'$  daily  ?Holding candesartan with aKI  ?Continue to monitor  ?

## 2022-01-13 NOTE — ED Notes (Signed)
Help get patient undressed into a gown on the monitor shown ekg to er provider patient is resting with family and call bell in reach ?

## 2022-01-13 NOTE — Progress Notes (Signed)
Pt placed on CPAP for bed tonight. RT will cont to monitor.  ?

## 2022-01-13 NOTE — ED Triage Notes (Signed)
Patient sent to ED for evaluation of hypomagnesia, 0.9 from blood draw yesterday. Patient has no complaints. Patient is alert, oriented, and in no apparent distress at this time. ?

## 2022-01-13 NOTE — ED Notes (Addendum)
This tech called [facility] and was told the room is ready, this tech asked the unit secretary to change the bed status to clean. ?

## 2022-01-14 DIAGNOSIS — G319 Degenerative disease of nervous system, unspecified: Secondary | ICD-10-CM | POA: Diagnosis not present

## 2022-01-14 DIAGNOSIS — K625 Hemorrhage of anus and rectum: Secondary | ICD-10-CM | POA: Diagnosis present

## 2022-01-14 DIAGNOSIS — I130 Hypertensive heart and chronic kidney disease with heart failure and stage 1 through stage 4 chronic kidney disease, or unspecified chronic kidney disease: Secondary | ICD-10-CM | POA: Diagnosis not present

## 2022-01-14 DIAGNOSIS — I5032 Chronic diastolic (congestive) heart failure: Secondary | ICD-10-CM | POA: Diagnosis not present

## 2022-01-14 DIAGNOSIS — Z83438 Family history of other disorder of lipoprotein metabolism and other lipidemia: Secondary | ICD-10-CM | POA: Diagnosis not present

## 2022-01-14 DIAGNOSIS — D631 Anemia in chronic kidney disease: Secondary | ICD-10-CM | POA: Diagnosis not present

## 2022-01-14 DIAGNOSIS — Z885 Allergy status to narcotic agent status: Secondary | ICD-10-CM | POA: Diagnosis not present

## 2022-01-14 DIAGNOSIS — N1831 Chronic kidney disease, stage 3a: Secondary | ICD-10-CM | POA: Diagnosis not present

## 2022-01-14 DIAGNOSIS — E876 Hypokalemia: Secondary | ICD-10-CM | POA: Diagnosis not present

## 2022-01-14 DIAGNOSIS — E785 Hyperlipidemia, unspecified: Secondary | ICD-10-CM | POA: Diagnosis not present

## 2022-01-14 DIAGNOSIS — D649 Anemia, unspecified: Secondary | ICD-10-CM | POA: Diagnosis not present

## 2022-01-14 DIAGNOSIS — Z7984 Long term (current) use of oral hypoglycemic drugs: Secondary | ICD-10-CM | POA: Diagnosis not present

## 2022-01-14 DIAGNOSIS — K219 Gastro-esophageal reflux disease without esophagitis: Secondary | ICD-10-CM | POA: Diagnosis not present

## 2022-01-14 DIAGNOSIS — E1122 Type 2 diabetes mellitus with diabetic chronic kidney disease: Secondary | ICD-10-CM | POA: Diagnosis not present

## 2022-01-14 DIAGNOSIS — I5033 Acute on chronic diastolic (congestive) heart failure: Secondary | ICD-10-CM | POA: Diagnosis not present

## 2022-01-14 DIAGNOSIS — Z792 Long term (current) use of antibiotics: Secondary | ICD-10-CM | POA: Diagnosis not present

## 2022-01-14 DIAGNOSIS — Z79899 Other long term (current) drug therapy: Secondary | ICD-10-CM | POA: Diagnosis not present

## 2022-01-14 DIAGNOSIS — N179 Acute kidney failure, unspecified: Secondary | ICD-10-CM | POA: Diagnosis not present

## 2022-01-14 DIAGNOSIS — G4733 Obstructive sleep apnea (adult) (pediatric): Secondary | ICD-10-CM | POA: Diagnosis not present

## 2022-01-14 DIAGNOSIS — D500 Iron deficiency anemia secondary to blood loss (chronic): Secondary | ICD-10-CM | POA: Diagnosis not present

## 2022-01-14 DIAGNOSIS — Z823 Family history of stroke: Secondary | ICD-10-CM | POA: Diagnosis not present

## 2022-01-14 DIAGNOSIS — I4819 Other persistent atrial fibrillation: Secondary | ICD-10-CM | POA: Diagnosis not present

## 2022-01-14 DIAGNOSIS — Z888 Allergy status to other drugs, medicaments and biological substances status: Secondary | ICD-10-CM | POA: Diagnosis not present

## 2022-01-14 DIAGNOSIS — Z7901 Long term (current) use of anticoagulants: Secondary | ICD-10-CM | POA: Diagnosis not present

## 2022-01-14 DIAGNOSIS — Z993 Dependence on wheelchair: Secondary | ICD-10-CM | POA: Diagnosis not present

## 2022-01-14 DIAGNOSIS — Z91040 Latex allergy status: Secondary | ICD-10-CM | POA: Diagnosis not present

## 2022-01-14 HISTORY — DX: Hemorrhage of anus and rectum: K62.5

## 2022-01-14 LAB — BASIC METABOLIC PANEL
Anion gap: 14 (ref 5–15)
BUN: 15 mg/dL (ref 8–23)
CO2: 32 mmol/L (ref 22–32)
Calcium: 7.7 mg/dL — ABNORMAL LOW (ref 8.9–10.3)
Chloride: 95 mmol/L — ABNORMAL LOW (ref 98–111)
Creatinine, Ser: 1.3 mg/dL — ABNORMAL HIGH (ref 0.61–1.24)
GFR, Estimated: 58 mL/min — ABNORMAL LOW (ref >60)
Glucose, Bld: 126 mg/dL — ABNORMAL HIGH (ref 70–99)
Potassium: 2.7 mmol/L — CL (ref 3.5–5.1)
Sodium: 141 mmol/L (ref 135–145)

## 2022-01-14 LAB — PREPARE RBC (CROSSMATCH)

## 2022-01-14 LAB — CBC
HCT: 22.9 % — ABNORMAL LOW (ref 39.0–52.0)
Hemoglobin: 7 g/dL — ABNORMAL LOW (ref 13.0–17.0)
MCH: 25.4 pg — ABNORMAL LOW (ref 26.0–34.0)
MCHC: 30.6 g/dL (ref 30.0–36.0)
MCV: 83 fL (ref 80.0–100.0)
Platelets: 305 10*3/uL (ref 150–400)
RBC: 2.76 MIL/uL — ABNORMAL LOW (ref 4.22–5.81)
RDW: 16.2 % — ABNORMAL HIGH (ref 11.5–15.5)
WBC: 7.5 10*3/uL (ref 4.0–10.5)
nRBC: 0 % (ref 0.0–0.2)

## 2022-01-14 LAB — GLUCOSE, CAPILLARY
Glucose-Capillary: 119 mg/dL — ABNORMAL HIGH (ref 70–99)
Glucose-Capillary: 128 mg/dL — ABNORMAL HIGH (ref 70–99)
Glucose-Capillary: 193 mg/dL — ABNORMAL HIGH (ref 70–99)
Glucose-Capillary: 161 mg/dL — ABNORMAL HIGH (ref 70–99)
Glucose-Capillary: 146 mg/dL — ABNORMAL HIGH (ref 70–99)

## 2022-01-14 LAB — MAGNESIUM: Magnesium: 1.5 mg/dL — ABNORMAL LOW (ref 1.7–2.4)

## 2022-01-14 LAB — HEMOGLOBIN AND HEMATOCRIT, BLOOD
HCT: 24.6 % — ABNORMAL LOW (ref 39.0–52.0)
Hemoglobin: 7.8 g/dL — ABNORMAL LOW (ref 13.0–17.0)

## 2022-01-14 MED ORDER — FUROSEMIDE 10 MG/ML IJ SOLN
20.0000 mg | Freq: Once | INTRAMUSCULAR | Status: AC
  Administered 2022-01-14: 20 mg via INTRAVENOUS
  Filled 2022-01-14: qty 2

## 2022-01-14 MED ORDER — SODIUM CHLORIDE 0.9% IV SOLUTION: Freq: Once | INTRAVENOUS | Status: AC

## 2022-01-14 MED ORDER — POTASSIUM CHLORIDE 20 MEQ PO PACK
40.0000 meq | PACK | ORAL | Status: AC
  Administered 2022-01-14 (×3): 40 meq via ORAL
  Filled 2022-01-14 (×3): qty 2

## 2022-01-14 MED ORDER — POTASSIUM CHLORIDE 20 MEQ PO PACK: 40.0000 meq | PACK | ORAL | Status: DC

## 2022-01-14 MED ORDER — MAGNESIUM SULFATE 2 GM/50ML IV SOLN
2.0000 g | Freq: Once | INTRAVENOUS | Status: AC
  Administered 2022-01-14: 2 g via INTRAVENOUS
  Filled 2022-01-14: qty 50

## 2022-01-14 MED ORDER — ADULT MULTIVITAMIN W/MINERALS CH
1.0000 | ORAL_TABLET | Freq: Every day | ORAL | Status: DC
  Administered 2022-01-14: 1 via ORAL
  Filled 2022-01-14: qty 1

## 2022-01-14 MED ORDER — PSYLLIUM 95 % PO PACK
1.0000 | PACK | Freq: Every day | ORAL | Status: DC
  Filled 2022-01-14 (×2): qty 1

## 2022-01-14 MED ORDER — BOOST / RESOURCE BREEZE PO LIQD CUSTOM
1.0000 | Freq: Two times a day (BID) | ORAL | Status: DC
Start: 2022-01-14 — End: 2022-01-15
  Administered 2022-01-14: 1 via ORAL

## 2022-01-14 NOTE — Progress Notes (Signed)
? ?Progress Note ? ?Patient Name: Anthony Mueller ?Date of Encounter: 01/14/2022 ? ?[facility] Cardiologist: Anthony More, MD  ? ?Subjective  ? ?Doing well. Has no sig symptoms. ? ?Crt 1.3 improving ?Hgb 7s ? ? ?Inpatient Medications  ?  ?Scheduled Meds: ? allopurinol  100 mg Oral q AM  ? carvedilol  25 mg Oral BID  ? cloNIDine  0.2 mg Oral BID  ? ferrous sulfate  325 mg Oral QPM  ? insulin aspart  0-9 Units Subcutaneous TID WC  ? linagliptin  5 mg Oral Daily  ? magnesium gluconate  500 mg Oral Daily  ? pantoprazole  80 mg Oral Daily  ? potassium chloride  40 mEq Oral Q4H  ? pravastatin  40 mg Oral q1800  ? sodium chloride flush  3 mL Intravenous Q12H  ? tamsulosin  0.4 mg Oral Daily  ? timolol  1 drop Both Eyes Daily  ? ?Continuous Infusions: ? sodium chloride    ? ?PRN Meds: ?sodium chloride, acetaminophen **OR** acetaminophen, albuterol, hydrALAZINE, sodium chloride flush  ? ?Vital Signs  ?  ?Vitals:  ? 01/14/22 9924 01/14/22 2683 01/14/22 0836 01/14/22 0923  ?BP: (!) 160/96  (!) 153/86 123/88  ?Pulse: 71  80 69  ?Resp: '16  14 16  '$ ?Temp: (!) 97.4 ?F (36.3 ?C)  98.5 ?F (36.9 ?C) 98.4 ?F (36.9 ?C)  ?TempSrc: Oral  Oral Oral  ?SpO2: 94%  96% 96%  ?Weight:  108.2 kg    ?Height:      ? ? ?Intake/Output Summary (Last 24 hours) at 01/14/2022 1107 ?Last data filed at 01/14/2022 [phone] ?Gross per 24 hour  ?Intake 855 ml  ?Output 300 ml  ?Net 555 ml  ? ?Last 3 Weights 01/14/2022 01/13/2022 01/13/2022  ?Weight (lbs) 238 lb 8.6 oz 238 lb 8.6 oz 245 lb  ?Weight (kg) 108.2 kg 108.2 kg 111.131 kg  ?   ? ?Telemetry  ?  ?Rate controlled atrial fibrillation - Personally Reviewed ? ?ECG  ?  ?NA - Personally Reviewed ? ?Physical Exam  ? ?Vitals:  ? 01/14/22 0836 01/14/22 0923  ?BP: (!) 153/86 123/88  ?Pulse: 80 69  ?Resp: 14 16  ?Temp: 98.5 ?F (36.9 ?C) 98.4 ?F (36.9 ?C)  ?SpO2: 96% 96%  ? ? ?GEN: No acute distress.   ?Neck: No JVD ?Cardiac: RRR, no murmurs, rubs, or gallops.  ?Respiratory: Clear to auscultation bilaterally. ?GI: Soft,  nontender, non-distended  ?MS: No edema; No deformity. ?Neuro:  Nonfocal  ?Psych: Normal affect  ? ?Labs  ?  ?High Sensitivity Troponin:  No results for input(s): TROPONINIHS in the last 720 hours.   ?Chemistry ?Recent Labs  ?Lab 01/12/22 ?1008 01/14/22 ?0414  ?NA 140 141  ?K 3.8 2.7*  ?CL 94* 95*  ?CO2 33* 32  ?GLUCOSE 152* 126*  ?BUN 17 15  ?CREATININE 1.51* 1.30*  ?CALCIUM 7.7* 7.7*  ?MG 0.9* 1.5*  ?GFRNONAA  --  29*  ?ANIONGAP  --  14  ?  ?Lipids No results for input(s): CHOL, TRIG, HDL, LABVLDL, LDLCALC, CHOLHDL in the last 168 hours.  ?Hematology ?Recent Labs  ?Lab 01/13/22 ?1522 01/13/22 ?2034 01/14/22 ?0414  ?WBC 7.4 8.2 7.5  ?RBC 2.79* 2.83* 2.76*  ?HGB 7.2* 7.2* 7.0*  ?HCT 23.6* 23.5* 22.9*  ?MCV 84.6 83.0 83.0  ?MCH 25.8* 25.4* 25.4*  ?MCHC 30.5 30.6 30.6  ?RDW 16.3* 16.1* 16.2*  ?PLT 329 322 305  ? ?Thyroid No results for input(s): TSH, FREET4 in the last 168 hours.  ?BNP ?  Recent Labs  ?Lab 01/12/22 ?1008  ?PROBNP 1,979*  ?  ?DDimer No results for input(s): DDIMER in the last 168 hours.  ? ?Radiology  ?  ?DG Chest Portable 1 View ? ?Result Date: 01/13/2022 ?CLINICAL DATA:  Difficulty breathing EXAM: PORTABLE CHEST 1 VIEW COMPARISON:  11/10/2021 FINDINGS: Transverse diameter of heart is increased. There is prominence of retrocardiac soft tissue densities, possibly suggesting fixed hiatal hernia. There is haziness in the right lower lung fields. There are no signs of alveolar pulmonary edema or focal pulmonary consolidation. There is no pneumothorax. IMPRESSION: Cardiomegaly. Haziness in the right lower lung fields may suggest layering of small right pleural effusion. Electronically Signed   By: Anthony Mueller M.D.   On: 01/13/2022 14:59   ? ?Cardiac Studies  ?TTE 11/11/2021 ?1. Left ventricular ejection fraction, by estimation, is 55 to 60%. The  ?left ventricle has normal function. The left ventricle has no regional  ?wall motion abnormalities. There is mild left ventricular hypertrophy.  ?Left  ventricular diastolic parameters  ?were normal.  ? 2. Right ventricular systolic function is normal. The right ventricular  ?size is normal. There is mildly elevated pulmonary artery systolic  ?pressure. The estimated right ventricular systolic pressure is 08.6 mmHg.  ? 3. Left atrial size was moderately dilated.  ? 4. Right atrial size was mildly dilated.  ? 5. The mitral valve is normal in structure. Mild mitral valve  ?regurgitation. No evidence of mitral stenosis.  ? 6. The aortic valve is tricuspid. Aortic valve regurgitation is trivial.  ?Aortic valve sclerosis/calcification is present, without any evidence of  ?aortic stenosis.  ? 7. The inferior vena cava is dilated in size with <50% respiratory  ?variability, suggesting right atrial pressure of 15 mmHg.  ? ?Patient Profile  ?   ?Anthony Mueller is a 71 y.o. male with a hx of persistent atrial fibrillation with pauses, chronic diastolic heart failure, hypertension, hyperlipidemia, DM, obstructive sleep apnea, anemia, and Cerebellar degeneration who is being seen 01/13/2022 for the evaluation of CHF at the request of Dr. Rogers Mueller.  Anthony Mueller does not clinically appear significantly decompensated. See consult note for full details ? ?Assessment & Plan  ?  ?Decompensated Hfpef: ?- clinically looks well today, with blood transfusion will do 20 mg IV lasix today ?- can transition to oral torsemide 20 mg daily tomorrow (lower than his home dose) ?- oral mag supplementation going home ? ?HTN: not great control. ?- can restart candesartan once he transitions to orals ?- continue home coreg and clonidine ? ?Persistent Atrial Fibrillations: Chads2vasc 4. Rate controlled. Will continue rate control strategy ?- continue home coreg ?- can restart eliquis as long as ok with GI ? ?Anemia: S/p hemorrhoidectomy ?- goal hgb > 7 ? ? ?He has follow up with Dr. Bettina Mueller 02/16/2022. If tolerating oral lasix, cardiology will subsequently sign off at that time. Anemia per primary team.  ?    ? ?For questions or updates, please contact [place] ?Please consult [email or web address] for contact info under  ? ?  ?   ?Signed, ?Janina Mayo, MD  ?01/14/2022, 11:07 AM   ? ?

## 2022-01-14 NOTE — Progress Notes (Signed)
Pt states that he will place himself on the CPAP unit. He is familiar with the unit and did not have any questions. Pt was advised to have RN contact RT should he need any assistance.  ?

## 2022-01-14 NOTE — Care Management Obs Status (Signed)
MEDICARE OBSERVATION STATUS NOTIFICATION ? ? ?Patient Details  ?Name: Anthony Mueller ?MRN: [id number] ?Date of Birth: 08/20/1951 ? ? ?Medicare Observation Status Notification Given:  Yes ? ? ? ?Tom-Johnson, Renea Ee, RN ?01/14/2022, 3:27 PM ?

## 2022-01-14 NOTE — Progress Notes (Signed)
Initial Nutrition Assessment ? ?DOCUMENTATION CODES:  ? ?Not applicable ? ?INTERVENTION:  ? ?Peach Boost Breeze po BID, each supplement provides 250 kcal and 9 grams of protein. ? ?MVI with minerals daily. ? ?NUTRITION DIAGNOSIS:  ? ?Inadequate oral intake related to decreased appetite as evidenced by per patient/family report. ? ?GOAL:  ? ?Patient will meet greater than or equal to 90% of their needs ? ?MONITOR:  ? ?PO intake, Supplement acceptance ? ?REASON FOR ASSESSMENT:  ? ?Malnutrition Screening Tool ?  ? ?ASSESSMENT:  ? ?71 yo male admitted with decompensated HF. PMH includes A fib, CHF, HTN, HLD, DM, OSA, anemia, cerebellar degeneration. ? ?Spoke with patient and his wife at bedside. He has been eating less than usual since he was diagnosed with COVID in December. After COVID, he had A fib and was fluid overloaded. Then he had a hemorrhoidectomy on 12/30/21. All of these events has caused his appetite to be decreased. He tried and liked Health visitor. RD to add supplement BID.  ? ?Labs reviewed. K 2.7, mag 1.5 ?CBG: [phone] ? ?Medications reviewed and include ferrous sulfate, Novolog, Tradjenta, magnesium gluconate, potassium chloride, Flomax. ?  ?Weight history reviewed. Patient has lost 5% of his usual weight in the past 2 months. Weight loss could be partially related to fluids/diuresis at home with hx of CHF. Likely also related to recent decreased appetite and poor intake. Patient is at increased nutrition risk, but does not meet criteria for malnutrition at this time.  ? ?NUTRITION - FOCUSED PHYSICAL EXAM: ? ?Flowsheet Row Most Recent Value  ?Orbital Region No depletion  ?Upper Arm Region Mild depletion  ?Thoracic and Lumbar Region No depletion  ?Buccal Region No depletion  ?Temple Region No depletion  ?Clavicle Bone Region Mild depletion  ?Clavicle and Acromion Bone Region No depletion  ?Scapular Bone Region No depletion  ?Dorsal Hand Mild depletion  ?Patellar Region Mild depletion  ?Anterior  Thigh Region No depletion  ?Posterior Calf Region No depletion  ?Edema (RD Assessment) Moderate  ?Hair Reviewed  ?Eyes Reviewed  ?Mouth Reviewed  ?Skin Reviewed  ?Nails Reviewed  ? ?  ? ? ?Diet Order:   ?Diet Order   ? ?       ?  Diet heart healthy/carb modified Room service appropriate? Yes; Fluid consistency: Thin  Diet effective now       ?  ? ?  ?  ? ?  ? ? ?EDUCATION NEEDS:  ? ?Education needs have been addressed ? ?Skin:  Skin Assessment: Reviewed RN Assessment ? ?Last BM:  3/16 ? ?Height:  ? ?Ht Readings from Last 1 Encounters:  ?01/13/22 '6\' 1"'$  (1.854 m)  ? ? ?Weight:  ? ?Wt Readings from Last 1 Encounters:  ?01/14/22 108.2 kg  ? ? ?BMI:  Body mass index is 31.47 kg/m?. ? ?Estimated Nutritional Needs:  ? ?Kcal:  2000-2200 ? ?Protein:  100-120 gm ? ?Fluid:  1.5-2 L ? ? ? ?[name] RD, LDN, CNSC ?Please refer to Amion for contact information.                                                       ? ?

## 2022-01-14 NOTE — Progress Notes (Signed)
?PROGRESS NOTE ? ? ? ?Anthony Mueller  JJK:[id number] DOB: 1951-03-08 DOA: 01/13/2022 ?PCP: Maris Berger, MD ? ? ?Brief Narrative:  ?HPI: Anthony Mueller is a 71 y.o. 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 He states she has been feeling fine. He has had a few episodes of dizziness when sitting up in bed, no lightheadedness. He is tired, but states he is no more tired than he usually is. No weakness from baseline. He has some shortness of breath intermittently.  ?  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 Had some bleeding associated with the mass. Last colonoscopy in 05/2021 showed benign polys and the anal mass. Per surgery note, uncomplicated procedure. He has had just a little bit of bleeding about 3-4 days ago and when he stood up he had a little bit bleeding. Sometimes his stool is dark, but typically a brown color. No blood in the stool.  ?  ?  ?On 01/05/22 he saw his cardiologist and had his CCB stopped due to peripheral edema. Also transitioned from furosemide to torsemide '20mg'$  BID. He states he has been urinating every 10-15 minutes and the swelling in his legs is much improved. He is unsure about weight as it's hard to weigh him. He denies any coughing. Does endorse orthopnea since 12/30/21.  ?  ?He denies any fever/chills, no headaches or vision changes, no chest pain or palpitations, no stomach pain, no N/V/D, no dysuria, +leg swelling but this has improved.  ?  ?ER Course:  vitals: temp: 99.8, bp: 180/116, HR: 78, RR: 16, oxygen: 97%RA ?Pertinent labs: (yesterday at office: creatinine: 1.51, bnp: 1979, magnesium: 0.9) ?Hgb: 7.6 (9-12), fecal occult negative (8.3 three weeks ago)  ?CXR: cardiomegaly, haziness in right lower lung fields may suggest layering of small right pleural effusion.  ?In Ed: replaced magnesium 2g x 2, cardiology  consulted  ?  ? ?Assessment & Plan: ?  ?Principal Problem: ?  Acute on chronic anemia ?Active Problems: ?  Hypomagnesemia ?  AKI (acute kidney injury) ([place]) ?  Bilateral lower extremity edema ?  Unspecified atrial fibrillation (HCC) with sinus pauses ?  Diabetes mellitus without complication ([place]) ?  Hypertension ?  OSA (obstructive sleep apnea) ?  Hyperlipidemia ?  GERD (gastroesophageal reflux disease) ?  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 ?Acute on chronic anemia secondary to rectal bleeding: Bleeding likely secondary to recent surgery and rectal mass.  Hemoglobin down to 7.0.  He has not noted any further bleeding since admission though.  Likely labs were lagging behind.  We will transfuse him 1 unit of PRBC and hold his Eliquis for now.  GI was contacted yesterday by admitting hospitalist however they recommended that patient follow-up with his primary GI and general surgery as outpatient.  Will monitor closely, if further evidence of more drop, will consult GI officially. ? ?Hypomagnesemia: Low again this morning and he was replaced. ? ?Hypokalemia: Low 2.7.  Replaced today. ? ?AKI (acute kidney injury) ([place]) ?Baseline creatinine appears to be around 1.0-1.1.  Presented with 1.5, improved somewhat today.  We will try to avoid nephrotoxic agents however due to CHF, he did receive 1 dose of 20 mg IV Lasix given by cardiology today.  We will monitor labs. ? ?Acute hypoxic respiratory failure secondary to decompensated heart failure with preserved ejection fraction: Per cardiology, he has decompensated CHF and that  is why they have given him Lasix especially now that he is going to get blood transfusion.  This is managed by cardiology, will defer to them.  Currently on 2 L of oxygen.  He did have some left basal crackles. ?  ?Persistent atrial fibrillation: Rate controlled with Coreg.  Holding Eliquis as mentioned above. ?  ?Diabetes mellitus without complication ([place]) ?a1c of 7.3 in 10/2021 ?Hold metformin, continue  tradjenta ?SSI and accuchecks per protocol  ?  ?Hypertension: Currently on clonidine .'2mg'$  BID, coreg '25mg'$  BID and candesartan '32mg'$  daily. Holding candesartan with aKI but continuing the rest.  Blood pressure reasonably controlled. ? ?OSA (obstructive sleep apnea) ?cpap at night  ?  ?Hyperlipidemia ?Continue lovastatin  ?  ?GERD (gastroesophageal reflux disease) ?Continue prilosec  ?  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  ?DVT prophylaxis:  ?  Code Status: Full Code  ?Family Communication: Wife present at bedside.  Plan of care discussed with patient in length and he/she verbalized understanding and agreed with it. ? ?Status is: Observation ?The patient will require care spanning > 2 midnights and should be moved to inpatient because: Needs blood transfusion and he is hypoxic and needs his renal function get back to baseline. ? ? ?Estimated body mass index is 31.47 kg/m? as calculated from the following: ?  Height as of this encounter: '6\' 1"'$  (1.854 m). ?  Weight as of this encounter: 108.2 kg. ? ?  ?Nutritional Assessment: ?Body mass index is 31.47 kg/m?Marland KitchenMarland Kitchen ?Seen by dietician.  I agree with the assessment and plan as outlined below: ?Nutrition Status: ?  ?  ?  ? ?. ?Skin Assessment: ?I have examined the patient's skin and I agree with the wound assessment as performed by the wound care RN as outlined below: ?  ? ?Consultants:  ?Cardiology ? ?Procedures:  ?None ? ?Antimicrobials:  ?Anti-infectives (From admission, onward)  ? ? None  ? ?  ?  ? ? ?Subjective: ?Seen and examined earlier today.  Wife at the bedside.  Patient was feeling well.  He had no complaints.  He denies shortness of breath or abdominal pain.  No rectal bleeding noted since admission. ? ?Objective: ?Vitals:  ? 01/14/22 0611 01/14/22 0836 01/14/22 0923 01/14/22 1248  ?BP:  (!) 153/86 123/88 (!) 143/85  ?Pulse:  80 69 65  ?Resp:  '14 16 18  '$ ?Temp:  98.5 ?F (36.9 ?C) 98.4 ?F (36.9 ?C) 98 ?F (36.7 ?C)  ?TempSrc:  Oral Oral Oral  ?SpO2:   96% 96% 95%  ?Weight: 108.2 kg     ?Height:      ? ? ?Intake/Output Summary (Last 24 hours) at 01/14/2022 1314 ?Last data filed at 01/14/2022 1247 ?Gross per 24 hour  ?Intake 1301 ml  ?Output 300 ml  ?Net 1001 ml  ? ?Filed Weights  ? 01/13/22 1302 01/13/22 1737 01/14/22 0611  ?Weight: 111.1 kg 108.2 kg 108.2 kg  ? ? ?Examination: ? ?General exam: Appears calm and comfortable, obese ?Respiratory system: Crackles at left base. Respiratory effort normal. ?Cardiovascular system: S1 & S2 heard, RRR. No JVD, murmurs, rubs, gallops or clicks. No pedal edema. ?Gastrointestinal system: Abdomen is nondistended, soft and nontender. No organomegaly or masses felt. Normal bowel sounds heard. ?Central nervous system: Alert and oriented. No focal neurological deficits. ?Extremities: Ace wrap in bilateral lower extremities. ?Psychiatry: Judgement and insight appear normal. Mood & affect appropriate.  ? ? ?Data Reviewed: I have personally reviewed following labs and imaging studies ? ?CBC: ?Recent Labs  ?  Lab 01/13/22 ?1202 01/13/22 ?1522 01/13/22 ?2034 01/14/22 ?0414  ?WBC 7.6 7.4 8.2 7.5  ?NEUTROABS 5.3  --   --   --   ?HGB 7.6* 7.2* 7.2* 7.0*  ?HCT 24.9* 23.6* 23.5* 22.9*  ?MCV 84.1 84.6 83.0 83.0  ?PLT 330 329 322 305  ? ?Basic Metabolic Panel: ?Recent Labs  ?Lab 01/12/22 ?1008 01/14/22 ?0414  ?NA 140 141  ?K 3.8 2.7*  ?CL 94* 95*  ?CO2 33* 32  ?GLUCOSE 152* 126*  ?BUN 17 15  ?CREATININE 1.51* 1.30*  ?CALCIUM 7.7* 7.7*  ?MG 0.9* 1.5*  ? ?GFR: ?Estimated Creatinine Clearance: 66.3 mL/min (A) (by C-G formula based on SCr of 1.3 mg/dL (H)). ?Liver Function Tests: ?No results for input(s): AST, ALT, ALKPHOS, BILITOT, PROT, ALBUMIN in the last 168 hours. ?No results for input(s): LIPASE, AMYLASE in the last 168 hours. ?No results for input(s): AMMONIA in the last 168 hours. ?Coagulation Profile: ?No results for input(s): INR, PROTIME in the last 168 hours. ?Cardiac Enzymes: ?No results for input(s): CKTOTAL, CKMB, CKMBINDEX, TROPONINI  in the last 168 hours. ?BNP (last 3 results) ?Recent Labs  ?  01/12/22 ?1008  ?PROBNP 1,979*  ? ?HbA1C: ?No results for input(s): HGBA1C in the last 72 hours. ?CBG: ?Recent Labs  ?Lab 01/13/22 ?1652 03/1

## 2022-01-14 NOTE — TOC Initial Note (Signed)
Transition of Care (TOC) - Initial/Assessment Note  ? ? ?Patient Details  ?Name: Anthony Mueller ?MRN: [id number] ?Date of Birth: 1950/12/10 ? ?Transition of Care (TOC) CM/SW Contact:    ?Tom-Johnson, Renea Ee, RN ?Phone Number: ?01/14/2022, 3:33 PM ? ?Clinical Narrative:                 ? ?CM spoke with patient and wife, Pamala Hurry at bedside about needs for post hospital transition. From home with wife. Has two supportive children. Recent Hemorrhoidectomy at [facility]. Admitted for Acute on chronic anemia. 1 unit PRBC given. Cardiology following. ?Pamala Hurry states patient has been wheelchair bound for 15 yrs. Pamala Hurry is patient's caregiver and she is complaining of giver role strain. States she feels worn out from caring for patient. Complaining of aches and pains from physically assisting patient at home. Requesting home health aide or rehab. MD notified of request.  ?PCP is Maris Berger, MD and [facility] [place].  ?CM will continue to follow with needs. ?  ?Barriers to Discharge: Continued Medical Work up ? ? ?Patient Goals and CMS Choice ?Patient states their goals for this hospitalization and ongoing recovery are:: Tget better and return home ?CMS Medicare.gov Compare Post Acute Care list provided to:: Patient ?  ? ?Expected Discharge Plan and Services ?  ?  ?Discharge Planning Services: CM Consult ?  ?Living arrangements for the past 2 months: [place] ?                ?  ?  ?  ?  ?  ?  ?  ?  ?  ?  ? ?Prior Living Arrangements/Services ?Living arrangements for the past 2 months: [place] ?Lives with:: Spouse ?Patient language and need for interpreter reviewed:: Yes ?Do you feel safe going back to the place where you live?: Yes      ?Need for Family Participation in Patient Care: Yes (Comment) ?Care giver support system in place?: Yes (comment) ?Current home services: DME (Wheelchair, lift chair) ?Criminal Activity/Legal Involvement Pertinent to Current  Situation/Hospitalization: No - Comment as needed ? ?Activities of Daily Living ?Home Assistive Devices/Equipment: Wheelchair ?ADL Screening (condition at time of admission) ?Patient's cognitive ability adequate to safely complete daily activities?: Yes ?Is the patient deaf or have difficulty hearing?: No ?Does the patient have difficulty seeing, even when wearing glasses/contacts?: No ?Does the patient have difficulty concentrating, remembering, or making decisions?: No ?Patient able to express need for assistance with ADLs?: Yes ?Does the patient have difficulty dressing or bathing?: Yes ?Independently performs ADLs?: No ?Communication: Independent ?Dressing (OT): Dependent ?Is this a change from baseline?: Pre-admission baseline ?Grooming: Needs assistance ?Is this a change from baseline?: Pre-admission baseline ?Feeding: Independent ?Bathing: Needs assistance ?Is this a change from baseline?: Pre-admission baseline ?Toileting: Needs assistance ?Is this a change from baseline?: Pre-admission baseline ?In/Out Bed: Needs assistance ?Is this a change from baseline?: Pre-admission baseline ?Does the patient have difficulty walking or climbing stairs?: Yes ?Weakness of Legs: Both ?Weakness of Arms/Hands: None ? ?Permission Sought/Granted ?Permission sought to share information with : Case Manager, Family Supports ?Permission granted to share information with : Yes, Verbal Permission Granted ?   ?   ?   ?   ? ?Emotional Assessment ?Appearance:: Appears stated age ?Attitude/Demeanor/Rapport: Engaged, Gracious ?Affect (typically observed): Accepting, Appropriate, Calm, Hopeful ?Orientation: : Oriented to Self, Oriented to Place, Oriented to  Time, Oriented to Situation ?Alcohol / Substance Use: Not Applicable ?Psych Involvement: No (comment) ? ?Admission  diagnosis:  Hypomagnesemia [E83.42] ?Symptomatic anemia [D64.9] ?Patient Active Problem List  ? Diagnosis Date Noted  ? Acute on chronic anemia 01/13/2022  ?  Hypomagnesemia 01/13/2022  ? OSA (obstructive sleep apnea) 01/13/2022  ? Hypertension 01/13/2022  ? AKI (acute kidney injury) ([place]) 01/13/2022  ? Bilateral lower extremity edema 01/13/2022  ? Hypertensive heart disease 01/05/2022  ? Hyperlipidemia 01/05/2022  ? Diabetes mellitus without complication ([place]) 08/81/1031  ? Unspecified atrial fibrillation (HCC) with sinus pauses 11/13/2021  ? Cardiomegaly 11/10/2021  ? 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 Type 2 diabetes mellitus with hyperlipidemia ([place]) 11/10/2021  ? Hypokalemia 11/10/2021  ? Cholelithiasis 11/10/2021  ? Transaminitis 11/10/2021  ? GERD (gastroesophageal reflux disease) 11/10/2021  ? ?PCP:  Maris Berger, MD ?Pharmacy:   [id number] [id number]?Phone: [phone]: [phone]? ? ? ? ?Social Determinants of Health (SDOH) Interventions ?  ? ?Readmission Risk Interventions ?No flowsheet data found. ? ? ?

## 2022-01-15 DIAGNOSIS — I5032 Chronic diastolic (congestive) heart failure: Secondary | ICD-10-CM | POA: Diagnosis not present

## 2022-01-15 DIAGNOSIS — I4819 Other persistent atrial fibrillation: Secondary | ICD-10-CM | POA: Diagnosis not present

## 2022-01-15 DIAGNOSIS — D649 Anemia, unspecified: Secondary | ICD-10-CM | POA: Diagnosis not present

## 2022-01-15 DIAGNOSIS — I130 Hypertensive heart and chronic kidney disease with heart failure and stage 1 through stage 4 chronic kidney disease, or unspecified chronic kidney disease: Secondary | ICD-10-CM | POA: Diagnosis not present

## 2022-01-15 LAB — BASIC METABOLIC PANEL
Anion gap: 11 (ref 5–15)
BUN: 14 mg/dL (ref 8–23)
CO2: 29 mmol/L (ref 22–32)
Calcium: 8.9 mg/dL (ref 8.9–10.3)
Chloride: 98 mmol/L (ref 98–111)
Creatinine, Ser: 1.37 mg/dL — ABNORMAL HIGH (ref 0.61–1.24)
GFR, Estimated: 55 mL/min — ABNORMAL LOW (ref >60)
Glucose, Bld: 154 mg/dL — ABNORMAL HIGH (ref 70–99)
Potassium: 3.7 mmol/L (ref 3.5–5.1)
Sodium: 138 mmol/L (ref 135–145)

## 2022-01-15 LAB — CBC WITH DIFFERENTIAL/PLATELET
Abs Immature Granulocytes: 0.06 10*3/uL (ref 0.00–0.07)
Basophils Absolute: 0.1 10*3/uL (ref 0.0–0.1)
Basophils Relative: 1 %
Eosinophils Absolute: 0.3 10*3/uL (ref 0.0–0.5)
Eosinophils Relative: 3 %
HCT: 28.1 % — ABNORMAL LOW (ref 39.0–52.0)
Hemoglobin: 8.5 g/dL — ABNORMAL LOW (ref 13.0–17.0)
Immature Granulocytes: 1 %
Lymphocytes Relative: 12 %
Lymphs Abs: 1.3 10*3/uL (ref 0.7–4.0)
MCH: 25.6 pg — ABNORMAL LOW (ref 26.0–34.0)
MCHC: 30.2 g/dL (ref 30.0–36.0)
MCV: 84.6 fL (ref 80.0–100.0)
Monocytes Absolute: 1.2 10*3/uL — ABNORMAL HIGH (ref 0.1–1.0)
Monocytes Relative: 11 %
Neutro Abs: 8 10*3/uL — ABNORMAL HIGH (ref 1.7–7.7)
Neutrophils Relative %: 72 %
Platelets: 360 10*3/uL (ref 150–400)
RBC: 3.32 MIL/uL — ABNORMAL LOW (ref 4.22–5.81)
RDW: 15.9 % — ABNORMAL HIGH (ref 11.5–15.5)
WBC: 11 10*3/uL — ABNORMAL HIGH (ref 4.0–10.5)
nRBC: 0 % (ref 0.0–0.2)

## 2022-01-15 LAB — TYPE AND SCREEN
ABO/RH(D): O POS
Antibody Screen: NEGATIVE
Unit division: 0

## 2022-01-15 LAB — BPAM RBC
Blood Product Expiration Date: 202304052359
ISSUE DATE / TIME: 202303170848
Unit Type and Rh: 5100

## 2022-01-15 LAB — GLUCOSE, CAPILLARY: Glucose-Capillary: 145 mg/dL — ABNORMAL HIGH (ref 70–99)

## 2022-01-15 LAB — MAGNESIUM: Magnesium: 1.8 mg/dL (ref 1.7–2.4)

## 2022-01-15 MED ORDER — APIXABAN 5 MG PO TABS
5.0000 mg | ORAL_TABLET | Freq: Two times a day (BID) | ORAL | 0 refills | Status: DC
Start: 2022-01-17 — End: 2022-06-02

## 2022-01-15 MED ORDER — TORSEMIDE 20 MG PO TABS: 20.0000 mg | ORAL_TABLET | Freq: Every day | ORAL | 0 refills | Status: DC

## 2022-01-15 MED ORDER — APIXABAN 5 MG PO TABS: 5.0000 mg | ORAL_TABLET | Freq: Two times a day (BID) | ORAL | 0 refills | Status: DC

## 2022-01-15 NOTE — Discharge Summary (Signed)
PatientPhysician Discharge Summary  ?Anthony Mueller AJO:[id number] DOB: 10-17-1951 DOA: 01/13/2022 ? ?PCP: Maris Berger, MD ? ?Admit date: 01/13/2022 ?Discharge date: 01/15/2022 ?30 Day Unplanned Readmission Risk Score   ? ?Flowsheet Row ED to Hosp-Admission (Current) from 01/13/2022 in [facility]  ?30 Day Unplanned Readmission Risk Score (%) 14.95 Filed at 01/15/2022 0800  ? ?  ? ? This score is the patient's risk of an unplanned readmission within 30 days of being discharged (0 -100%). The score is based on dignosis, age, lab data, medications, orders, and past utilization.   ?Low:  0-14.9   Medium: 15-21.9   High: 22-29.9   Extreme: 30 and above ? ?  ? ?  ? ? ? ?Admitted From: Home ?Disposition: Home ? ?Recommendations for Outpatient Follow-up:  ?Follow up with PCP in 1-2 weeks ?Please obtain BMP/CBC in one week ?Please follow-up with general surgery as soon as possible. ?Please hold your Eliquis until 01/17/2022 and resume if no further evidence of rectal bleeding. ?Please follow up with your PCP on the following pending results: ?Unresulted Labs (From admission, onward)  ? ? None  ? ?  ?  ? ? ?Home Health: None ?Equipment/Devices: None ? ?Discharge Condition: Stable ?CODE STATUS: Full code ?Diet recommendation: Cardiac ? ?Subjective: Seen and examined.  He said that he is doing fine and had no complaint.  Wife at the bedside as well.  Discussed plan of discharge and they were both very happy to go home. ? ?Brief/Interim Summary:  Anthony Mueller is a 71 y.o. 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 He states she has been feeling fine. He has had a few episodes of dizziness when sitting up in bed, no lightheadedness. He is tired, but states he is no more tired than he usually is. No weakness from baseline. He has some shortness of breath  intermittently.  ?  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 Had some bleeding associated with the mass. Last colonoscopy in 05/2021 showed benign polys and the anal mass. Per surgery note, uncomplicated procedure. He has had just a little bit of bleeding about 3-4 days ago and when he stood up he had a little bit bleeding. Sometimes his stool is dark, but typically a brown color. No blood in the stool.  ?  ?On 01/05/22 he saw his cardiologist and had his CCB stopped due to peripheral edema. Also transitioned from furosemide to torsemide '20mg'$  BID. He states he has been urinating every 10-15 minutes and the swelling in his legs is much improved.  ? ?Upon arrival to ED, he was hemodynamically stable.  His hemoglobin was initially 7.6 but FOBT was negative.  His hemoglobin was 8.3 just 3 weeks ago.  Chest x-ray showed haziness in the right lower field which may suggest layering of small right pleural effusion.  Some hypomagnesemia which was replaced.  He was admitted to hospital service and cardiology consulted. ?  ?Acute on chronic anemia secondary to rectal bleeding: Bleeding likely secondary to recent surgery and rectal mass.  Hemoglobin further dropped to 7.0 and he received 1 unit of PRBC transfusion 01/14/2022 and Eliquis was stopped.  Hemoglobin 8.6 today.  No further episodes of rectal bleeding according to the patient.  Reportedly according to to H&P, GI was contacted at the time of admission however they recommended that patient follow-up  with his primary GI and general surgery as outpatient.  I am holding his Eliquis at the time of discharge but I have clearly instructed the patient and his wife to resume Eliquis on Monday if there is no further evidence of rectal bleeding.  I also highly encouraged that patient should follow-up with the general surgery however wife said that she is not wanting to follow-up with the primary surgeon and instead she is going to call [facility] [place] [facility] and make an  appointment. ?  ?Hypomagnesemia: Replaced ?  ?Hypokalemia: Resolved. ?  ?AKI (acute kidney injury) ([place]) ?Baseline creatinine appears to be around 1.0-1.1.  Presented with 1.5 which is improving and currently 1.3.  There is small evidence that patient may now be having CKD stage IIIa. ?  ?Acute hypoxic respiratory failure secondary to decompensated heart failure with preserved ejection fraction: Was seen by cardiology, given IV Lasix.  He was weaned to room air now.  We will resume reduced dose of torsemide from 20 mg twice daily to 20 mg daily per cardiology recommendations. ?  ?Persistent atrial fibrillation: Rate controlled with Coreg.  Holding Eliquis as mentioned above. ?  ?Diabetes mellitus without complication [facility]): Resume home medications. ?  ?Hypertension: Controlled.  Resume home medications. ?  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 ?Discharge plan was discussed with patient and/or family member and they verbalized understanding and agreed with it.  ?Discharge Diagnoses:  ?Principal Problem: ?  Acute on chronic anemia ?Active Problems: ?  Hypomagnesemia ?  AKI (acute kidney injury) ([place]) ?  Bilateral lower extremity edema ?  Unspecified atrial fibrillation (HCC) with sinus pauses ?  Diabetes mellitus without complication ([place]) ?  Hypertension ?  OSA (obstructive sleep apnea) ?  Hyperlipidemia ?  GERD (gastroesophageal reflux disease) ?  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  Rectal bleeding ? ? ? ?Discharge Instructions ? ? ?Allergies as of 01/15/2022   ? ?   Reactions  ? Liraglutide   ? Other reaction(s): Other (See Comments) ?CAN NOT TAKE BECAUSE A SIDE EFFECT IS PANCREATITIS AND HE HAS A HISTORY ?Contraindicated due to pancreatitis  ? Hydrocodone   ? Other reaction(s): Other (See Comments) ?MAKES FEEL SPACEY AND LIGHT HEADED  ? Hydrocodone-acetaminophen   ? Other reaction(s): Other (See Comments) ?Feels " orbity"  ? Other   ? Other reaction(s): Other (See Comments) ?BANDAID  CAUSES RASH  ?  Tape Other (See Comments)  ? Skin irritation, welts  ? Latex Rash  ? Band-aids  ? Metronidazole Nausea And Vomiting  ? Other reaction(s): GI Upset (intolerance) ? AND DEPRESSION ;  TABLETS ?Does fine w/ IV Flagyl  ? ?  ? ?  ?Medication List  ?  ? ?TAKE these medications   ? ?albuterol 108 (90 Base) MCG/ACT inhaler ?Commonly known as: VENTOLIN HFA ?Inhale 2 puffs into the lungs every 6 (six) hours as needed for wheezing or shortness of breath. ?  ?allopurinol 100 MG tablet ?Commonly known as: ZYLOPRIM ?Take 100 mg by mouth in the morning. ?  ?apixaban 5 MG Tabs tablet ?Commonly known as: ELIQUIS ?Take 1 tablet (5 mg total) by mouth 2 (two) times daily. ?Start taking on: January 17, 2022 ?What changed: These instructions start on January 17, 2022. If you are unsure what to do until then, ask your doctor or other care provider. ?  ?ascorbic acid 500 MG tablet ?Commonly known as: VITAMIN C ?Take 1 tablet (500 mg total) by mouth daily. ?  ?candesartan 32 MG tablet ?Commonly  known as: ATACAND ?Take 32 mg by mouth daily. ?  ?carvedilol 25 MG tablet ?Commonly known as: COREG ?Take 25 mg by mouth 2 (two) times daily. ?  ?cloNIDine 0.2 MG tablet ?Commonly known as: CATAPRES ?Take 0.2 mg by mouth 2 (two) times daily. ?  ?Iron Slow Release 143 (45 Fe) MG Tbcr ?Generic drug: Ferrous Sulfate ?Take 143 mg by mouth every evening. ?  ?lovastatin 40 MG tablet ?Commonly known as: MEVACOR ?Take 40 mg by mouth every evening. ?  ?metFORMIN 500 MG tablet ?Commonly known as: GLUCOPHAGE ?Take 1,000 mg by mouth 2 (two) times daily. ?  ?omeprazole 20 MG capsule ?Commonly known as: PRILOSEC ?Take 40 mg by mouth daily. ?  ?oxaprozin 600 MG tablet ?Commonly known as: DAYPRO ?Take 1,200 mg by mouth daily as needed (Gout). ?  ?tamsulosin 0.4 MG Caps capsule ?Commonly known as: FLOMAX ?Take 0.4 mg by mouth daily. ?  ?timolol 0.5 % ophthalmic solution ?Commonly known as: TIMOPTIC ?Place 1 drop into both eyes daily. ?  ?torsemide 20 MG tablet ?Commonly  known as: DEMADEX ?Take 1 tablet (20 mg total) by mouth daily. ?What changed: when to take this ?  ?Tradjenta 5 MG Tabs tablet ?Generic drug: linagliptin ?Take 5 mg by mouth daily. ?  ?zinc sulfate 220

## 2022-01-15 NOTE — Progress Notes (Signed)
? ?Progress Note ? ?Patient Name: Anthony Mueller ?Date of Encounter: 01/15/2022 ? ?[facility] Cardiologist: Shirlee More, MD  ? ?Subjective  ? ?No changes. They are ready to go home. Plan to celebrate their wedding anniversary  ? ?Crt stable ?Repeat K improved ? ?Inpatient Medications  ?  ?Scheduled Meds: ? allopurinol  100 mg Oral q AM  ? carvedilol  25 mg Oral BID  ? cloNIDine  0.2 mg Oral BID  ? feeding supplement  1 Container Oral BID BM  ? ferrous sulfate  325 mg Oral QPM  ? insulin aspart  0-9 Units Subcutaneous TID WC  ? linagliptin  5 mg Oral Daily  ? magnesium gluconate  500 mg Oral Daily  ? multivitamin with minerals  1 tablet Oral Daily  ? pantoprazole  80 mg Oral Daily  ? pravastatin  40 mg Oral q1800  ? psyllium  1 packet Oral Daily  ? sodium chloride flush  3 mL Intravenous Q12H  ? tamsulosin  0.4 mg Oral Daily  ? timolol  1 drop Both Eyes Daily  ? ?Continuous Infusions: ? sodium chloride    ? ?PRN Meds: ?sodium chloride, acetaminophen **OR** acetaminophen, albuterol, hydrALAZINE, sodium chloride flush  ? ?Vital Signs  ?  ?Vitals:  ? 01/14/22 2000 01/15/22 0500 01/15/22 0500 01/15/22 0910  ?BP: (!) 160/85  (!) 167/100 (!) 150/85  ?Pulse: 78  61 81  ?Resp: '18  16 18  '$ ?Temp: 98.6 ?F (37 ?C)  (!) 97.5 ?F (36.4 ?C) 98 ?F (36.7 ?C)  ?TempSrc: Oral   Oral  ?SpO2: 95%  94% 95%  ?Weight:  109.1 kg    ?Height:      ? ? ?Intake/Output Summary (Last 24 hours) at 01/15/2022 1056 ?Last data filed at 01/14/2022 1900 ?Gross per 24 hour  ?Intake 973 ml  ?Output 450 ml  ?Net 523 ml  ? ?Last 3 Weights 01/15/2022 01/14/2022 01/13/2022  ?Weight (lbs) 240 lb 8.4 oz 238 lb 8.6 oz 238 lb 8.6 oz  ?Weight (kg) 109.1 kg 108.2 kg 108.2 kg  ?   ? ?Telemetry  ?  ?Rate controlled atrial fibrillation - Personally Reviewed ? ?ECG  ?  ?NA - Personally Reviewed ? ?Physical Exam  ? ?Vitals:  ? 01/15/22 0500 01/15/22 0910  ?BP: (!) 167/100 (!) 150/85  ?Pulse: 61 81  ?Resp: 16 18  ?Temp: (!) 97.5 ?F (36.4 ?C) 98 ?F (36.7 ?C)  ?SpO2: 94%  95%  ? ? ?GEN: No acute distress.   ?Neck: No JVD ?Cardiac: RRR, no murmurs, rubs, or gallops.  ?Respiratory: Clear to auscultation bilaterally. ?GI: Soft, nontender, non-distended  ?MS: No edema; No deformity. ?Neuro:  Nonfocal  ?Psych: Normal affect  ? ?Labs  ?  ?High Sensitivity Troponin:  No results for input(s): TROPONINIHS in the last 720 hours.   ?Chemistry ?Recent Labs  ?Lab 01/12/22 ?1008 01/14/22 ?0414 01/15/22 ?0729  ?NA 140 141 138  ?K 3.8 2.7* 3.7  ?CL 94* 95* 98  ?CO2 33* 32 29  ?GLUCOSE 152* 126* 154*  ?BUN '17 15 14  '$ ?CREATININE 1.51* 1.30* 1.37*  ?CALCIUM 7.7* 7.7* 8.9  ?MG 0.9* 1.5*  --   ?GFRNONAA  --  58* 55*  ?ANIONGAP  --  14 11  ?  ?Lipids No results for input(s): CHOL, TRIG, HDL, LABVLDL, LDLCALC, CHOLHDL in the last 168 hours.  ?Hematology ?Recent Labs  ?Lab 01/13/22 ?2034 01/14/22 ?0414 01/14/22 ?1310 01/15/22 ?0729  ?WBC 8.2 7.5  --  11.0*  ?RBC 2.83* 2.76*  --  3.32*  ?HGB 7.2* 7.0* 7.8* 8.5*  ?HCT 23.5* 22.9* 24.6* 28.1*  ?MCV 83.0 83.0  --  84.6  ?MCH 25.4* 25.4*  --  25.6*  ?MCHC 30.6 30.6  --  30.2  ?RDW 16.1* 16.2*  --  15.9*  ?PLT 322 305  --  360  ? ?Thyroid No results for input(s): TSH, FREET4 in the last 168 hours.  ?BNP ?Recent Labs  ?Lab 01/12/22 ?1008  ?PROBNP 1,979*  ?  ?DDimer No results for input(s): DDIMER in the last 168 hours.  ? ?Radiology  ?  ?DG Chest Portable 1 View ? ?Result Date: 01/13/2022 ?CLINICAL DATA:  Difficulty breathing EXAM: PORTABLE CHEST 1 VIEW COMPARISON:  11/10/2021 FINDINGS: Transverse diameter of heart is increased. There is prominence of retrocardiac soft tissue densities, possibly suggesting fixed hiatal hernia. There is haziness in the right lower lung fields. There are no signs of alveolar pulmonary edema or focal pulmonary consolidation. There is no pneumothorax. IMPRESSION: Cardiomegaly. Haziness in the right lower lung fields may suggest layering of small right pleural effusion. Electronically Signed   By: Elmer Picker M.D.   On:  01/13/2022 14:59   ? ?Cardiac Studies  ?TTE 11/11/2021 ?1. Left ventricular ejection fraction, by estimation, is 55 to 60%. The  ?left ventricle has normal function. The left ventricle has no regional  ?wall motion abnormalities. There is mild left ventricular hypertrophy.  ?Left ventricular diastolic parameters  ?were normal.  ? 2. Right ventricular systolic function is normal. The right ventricular  ?size is normal. There is mildly elevated pulmonary artery systolic  ?pressure. The estimated right ventricular systolic pressure is 18.2 mmHg.  ? 3. Left atrial size was moderately dilated.  ? 4. Right atrial size was mildly dilated.  ? 5. The mitral valve is normal in structure. Mild mitral valve  ?regurgitation. No evidence of mitral stenosis.  ? 6. The aortic valve is tricuspid. Aortic valve regurgitation is trivial.  ?Aortic valve sclerosis/calcification is present, without any evidence of  ?aortic stenosis.  ? 7. The inferior vena cava is dilated in size with <50% respiratory  ?variability, suggesting right atrial pressure of 15 mmHg.  ? ?Patient Profile  ?   ?Anthony Mueller is a 71 y.o. male with a hx of persistent atrial fibrillation with pauses, chronic diastolic heart failure, hypertension, hyperlipidemia, DM, obstructive sleep apnea, anemia, and Cerebellar degeneration who is being seen 01/13/2022 for the evaluation of CHF at the request of Dr. Rogers Blocker.  Mr Sheridan does not clinically appear significantly decompensated. See consult note for full details ? ?Assessment & Plan  ?  ?Decompensated Hfpef: ?- clinically looks well  ?- can transition to oral torsemide 20 mg today on discharge ?- oral mag and potassium supplementation going home ? ?HTN: not great control. ?- can restart candesartan once he transitions to orals ?- continue home coreg and clonidine ? ?Persistent Atrial Fibrillations: Chads2vasc 4. Rate controlled. Will continue rate control strategy ?- continue home coreg ?- can restart eliquis as long as  ok with GI ? ?Anemia: S/p hemorrhoidectomy ?- goal hgb > 7 ? ? ?He has follow up with Dr. Bettina Gavia 02/16/2022. If tolerating oral lasix, cardiology will subsequently sign off at that time. Anemia per primary team.  ?   ? ?For questions or updates, please contact [place] ?Please consult [email or web address] for contact info under  ? ?  ?   ?Signed, ?Janina Mayo, MD  ?01/15/2022, 10:56 AM   ? ?

## 2022-01-15 NOTE — Progress Notes (Signed)
Patient has refused all AM medications due to pending discharge. Denies pain at this time. No signs or symptoms of acute distress noted or voiced. Spouse states patient will take medication when they get home.  ?

## 2022-01-15 NOTE — TOC Transition Note (Signed)
Transition of Care (TOC) - CM/SW Discharge Note ? ? ?Patient Details  ?Name: Anthony Mueller ?MRN: [id number] ?Date of Birth: [birth year] ? ?Transition of Care (TOC) CM/SW Contact:  ?Bartholomew Crews, RN ?Phone Number: [phone] ?01/15/2022, 4:35 PM ? ? ?Clinical Narrative:    ? ?Patient transitioned home today. Spoke with patient on the phone who requested that NCM speak with his spouse. Discussed recommendation for [facility] - agreeable. Offered choice. Referral accepted by [facility] for PT, OT, Aide, and SW. No further TOC needs identified.  ? ?Final next level of care: [place] ?Barriers to Discharge: No Barriers Identified ? ? ?Patient Goals and CMS Choice ?Patient states their goals for this hospitalization and ongoing recovery are:: Tget better and return home ?CMS Medicare.gov Compare Post Acute Care list provided to:: Patient ?Choice offered to / list presented to : Patient, Spouse ? ?Discharge Placement ?  ?           ?  ?  ?  ?  ? ?Discharge Plan and Services ?  ?Discharge Planning Services: CM Consult ?           ?DME Arranged: N/A ?DME Agency: NA ?  ?  ?  ?HH Arranged: OT, PT, Nurse's Aide, Social Work ?[place] Agency: [facility] ?Date HH Agency Contacted: 01/15/22 ?Time [place]: 4627 ?Representative spoke with at [place]: Delsa Sale ? ?Social Determinants of Health (SDOH) Interventions ?  ? ? ?Readmission Risk Interventions ?No flowsheet data found. ? ? ? ? ?

## 2022-01-15 NOTE — Evaluation (Signed)
Occupational Therapy Evaluation ?Patient Details ?Name: Anthony Mueller ?MRN: [id number] ?DOB: 05/19/1951 ?Today's Date: 01/15/2022 ? ? ?History of Present Illness 71 y/o male presented to ED on 01/13/22 for abnormal labs from cardiologist and generalized weakness. Found to have low Hgb. 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 PMH: T2DM, HLD, HTN cerebellar degeneration, persistent AF, IDA  ? ?Clinical Impression ?  ?Pt admitted for concerns listed above. PTA pt reported that he was able to provide 75% of the work to complete transfers, with his wife providing some assist. As for ADL's, pt's wife assisted a lot with LB ADL's. At this time, pt presents with increased weakness and decreased activity tolerance. Pt is unable to transfer from the bed to his WC, which he reports he could do at home. Requiring max A +2, pt was able to clear his hips 4 times, however was not successful in full pivot transfer. OT recommending [facility] HH services. Acute OT will continue to follow.   ?   ? ?Recommendations for follow up therapy are one component of a multi-disciplinary discharge planning process, led by the attending physician.  Recommendations may be updated based on patient status, additional functional criteria and insurance authorization.  ? ?Follow Up Recommendations ? Home health OT  ?  ?Assistance Recommended at Discharge Frequent or constant Supervision/Assistance  ?Patient can return home with the following Two people to help with walking and/or transfers;Two people to help with bathing/dressing/bathroom;Assist for transportation;Help with stairs or ramp for entrance ? ?  ?Functional Status Assessment ? Patient has had a recent decline in their functional status and demonstrates the ability to make significant improvements in function in a reasonable and predictable amount of time.  ?Equipment Recommendations ? None recommended by OT  ?  ?Recommendations for Other Services   ? ? ?  ?Precautions /  Restrictions Precautions ?Precautions: Fall ?Restrictions ?Weight Bearing Restrictions: No  ? ?  ? ?Mobility Bed Mobility ?Overal bed mobility: Needs Assistance ?Bed Mobility: Supine to Sit, Sit to Supine ?  ?  ?Supine to sit: Min guard ?Sit to supine: Mod assist ?  ?General bed mobility comments: Min gaurd for safety and heavy use of railings to sit and mod A to bring BLE into bed and repositino ?  ? ?Transfers ?Overall transfer level: Needs assistance ?Equipment used: Rolling walker (2 wheels) ?Transfers: Sit to/from Stand ?Sit to Stand: Max assist, +2 safety/equipment, +2 physical assistance ?  ?  ?  ?  ?  ?General transfer comment: attempted stand pivot multiple times, unsuccessful, wife reports that he has more hand holds in better places at home, as well as her assisting with all transfers ?  ? ?  ?Balance Overall balance assessment: Needs assistance ?Sitting-balance support: Feet supported ?Sitting balance-Leahy Scale: Good ?  ?  ?Standing balance support: Bilateral upper extremity supported ?Standing balance-Leahy Scale: Zero ?Standing balance comment: Unable to maintain standing even with heavy support from furniture and therapies ?  ?  ?  ?  ?  ?  ?  ?  ?  ?  ?  ?   ? ?ADL either performed or assessed with clinical judgement  ? ?ADL Overall ADL's : Needs assistance/impaired ?Eating/Feeding: Set up;Sitting ?  ?Grooming: Set up;Sitting ?  ?Upper Body Bathing: Moderate assistance;Sitting ?  ?Lower Body Bathing: Maximal assistance;Sitting/lateral leans;+2 for physical assistance;+2 for safety/equipment ?  ?Upper Body Dressing : Minimal assistance;Sitting ?  ?Lower Body Dressing: Maximal assistance;+2 for physical assistance;+2 for safety/equipment;Sitting/lateral leans;Sit to/from stand ?  ?  Toilet Transfer: Maximal assistance;+2 for physical assistance;+2 for safety/equipment;Squat-pivot ?  ?Toileting- Clothing Manipulation and Hygiene: Maximal assistance;Sitting/lateral lean;Sit to/from stand ?  ?  ?  ?   ?General ADL Comments: Requiring increased assist due to weakness  ? ? ? ?Vision Baseline Vision/History: 0 No visual deficits ?Ability to See in Adequate Light: 0 Adequate ?Patient Visual Report: No change from baseline ?Vision Assessment?: No apparent visual deficits  ?   ?Perception   ?  ?Praxis   ?  ? ?Pertinent Vitals/Pain Pain Assessment ?Pain Assessment: No/denies pain  ? ? ? ?Hand Dominance Right ?  ?Extremity/Trunk Assessment Upper Extremity Assessment ?Upper Extremity Assessment: Overall WFL for tasks assessed ?  ?Lower Extremity Assessment ?Lower Extremity Assessment: Defer to PT evaluation ?  ?Cervical / Trunk Assessment ?Cervical / Trunk Assessment: Kyphotic ?  ?Communication Communication ?Communication: Expressive difficulties ?  ?Cognition Arousal/Alertness: Awake/alert ?Behavior During Therapy: [facility] for tasks assessed/performed ?Overall Cognitive Status: Within Functional Limits for tasks assessed ?  ?  ?  ?  ?  ?  ?  ?  ?  ?  ?  ?  ?  ?  ?  ?  ?  ?  ?  ?General Comments  VSS on RA ? ?  ?Exercises   ?  ?Shoulder Instructions    ? ? ?Home Living Family/patient expects to be discharged to:: Private residence ?Living Arrangements: Spouse/significant other ?Available Help at Discharge: Family;Available 24 hours/day ?Type of Home: House ?Home Access: Ramped entrance ?  ?  ?Home Layout: One level ?  ?  ?Bathroom Shower/Tub: Walk-in shower ?  ?Bathroom Toilet: Handicapped height ?  ?  ?Home Equipment: Wheelchair - manual;Other (comment);Grab bars - tub/shower;Rolling Environmental consultant (2 wheels);Wheelchair - power ?  ?  ?  ? ?  ?Prior Functioning/Environment Prior Level of Function : Needs assist ?  ?  ?  ?  ?  ?  ?Mobility Comments: Requiring max A from wife to assist with transfers, as well as heavy pull on stationary furniture ?ADLs Comments: Assist from wife for all LB ADL's ?  ? ?  ?  ?OT Problem List: Decreased strength;Decreased activity tolerance;Impaired balance (sitting and/or standing);Decreased  coordination ?  ?   ?OT Treatment/Interventions: Self-care/ADL training;Therapeutic exercise;Energy conservation;DME and/or AE instruction;Therapeutic activities;Patient/family education;Balance training  ?  ?OT Goals(Current goals can be found in the care plan section) Acute Rehab OT Goals ?Patient Stated Goal: To go home ?OT Goal Formulation: With patient ?Time For Goal Achievement: 01/29/22 ?Potential to Achieve Goals: Fair  ?OT Frequency: Min 2X/week ?  ? ?Co-evaluation PT/OT/SLP Co-Evaluation/Treatment: Yes ?Reason for Co-Treatment: Complexity of the patient's impairments (multi-system involvement);For patient/therapist safety;To address functional/ADL transfers ?  ?OT goals addressed during session: Strengthening/ROM ?  ? ?  ?AM-PAC OT "6 Clicks" Daily Activity     ?Outcome Measure Help from another person eating meals?: A Little ?Help from another person taking care of personal grooming?: A Little ?Help from another person toileting, which includes using toliet, bedpan, or urinal?: A Lot ?Help from another person bathing (including washing, rinsing, drying)?: A Lot ?Help from another person to put on and taking off regular upper body clothing?: A Little ?Help from another person to put on and taking off regular lower body clothing?: A Lot ?6 Click Score: 15 ?  ?End of Session Equipment Utilized During Treatment: Rolling walker (2 wheels) ?Nurse Communication: Need for lift equipment;Mobility status ? ?Activity Tolerance: Patient tolerated treatment well ?Patient left: in bed ? ?OT Visit Diagnosis: Other abnormalities of gait  and mobility (R26.89);Muscle weakness (generalized) (M62.81)  ?              ?Time: 8372-9021 ?OT Time Calculation (min): 36 min ?Charges:  OT General Charges ?$OT Visit: 1 Visit ?OT Evaluation ?$OT Eval Moderate Complexity: 1 Mod ? ?Francisco Eyerly H., OTR/L ?Acute Rehabilitation ? ?Gabriela Irigoyen Elane Yolanda Bonine ?01/15/2022, 1:22 PM ?

## 2022-01-15 NOTE — Evaluation (Signed)
Physical Therapy Evaluation ?Patient Details ?Name: Anthony Mueller ?MRN: [id number] ?DOB: December 04, 1950 ?Today's Date: 01/15/2022 ? ?History of Present Illness ? 71 y/o male presented to ED on 01/13/22 for abnormal labs from cardiologist and generalized weakness. Found to have low Hgb. 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 PMH: T2DM, HLD, HTN cerebellar degeneration, persistent AF, IDA  ?Clinical Impression ? Patient admitted with the above. Patient presents with weakness, decreased activity tolerance, and impaired functional mobility. Patient lives with wife who assists him daily at baseline as he is w/c bound. Patient unable to simulate transfer to w/c x4 attempts with various techniques and maxA+2. Patient and wife state due to not having solid objects to grasp like at home. Patient utilizes furniture and exercise equipment handles to assist with transfer. Patient will benefit from skilled PT services during acute stay to address listed deficits. Recommend HHPT at discharge to maximize functional transfers and safety. Patient will also benefit from [facility] aide to assist with decreasing caregiver burden and burnout.   ?   ? ?Recommendations for follow up therapy are one component of a multi-disciplinary discharge planning process, led by the attending physician.  Recommendations may be updated based on patient status, additional functional criteria and insurance authorization. ? ?Follow Up Recommendations Home health PT ? ?  ?Assistance Recommended at Discharge Frequent or constant Supervision/Assistance  ?Patient can return home with the following ? Two people to help with walking and/or transfers;Two people to help with bathing/dressing/bathroom;Assistance with cooking/housework;Assist for transportation ? ?  ?Equipment Recommendations None recommended by PT  ?Recommendations for Other Services ?    ?  ?Functional Status Assessment Patient has had a recent decline in their functional status and/or  demonstrates limited ability to make significant improvements in function in a reasonable and predictable amount of time  ? ?  ?Precautions / Restrictions Precautions ?Precautions: Fall ?Restrictions ?Weight Bearing Restrictions: No  ? ?  ? ?Mobility ? Bed Mobility ?Overal bed mobility: Needs Assistance ?Bed Mobility: Supine to Sit, Sit to Supine ?  ?  ?Supine to sit: Min guard ?Sit to supine: Mod assist ?  ?General bed mobility comments: Min gaurd for safety and heavy use of railings to sit and mod A to bring BLE into bed and repositino ?  ? ?Transfers ?Overall transfer level: Needs assistance ?Equipment used: Rolling Kiaja Shorty (2 wheels) ?Transfers: Sit to/from Stand ?Sit to Stand: Max assist, +2 safety/equipment, +2 physical assistance ?  ?  ?  ?  ?  ?General transfer comment: attempted stand pivot multiple times, unsuccessful, wife reports that he has more hand holds in better places at home, as well as her assisting with all transfers. Both deferred further attempts to transfer. Seems unsafe with their technique but they are accustomed to their version now ?  ? ?Ambulation/Gait ?  ?  ?  ?  ?  ?  ?  ?General Gait Details: unable ? ?Stairs ?  ?  ?  ?  ?  ? ?Wheelchair Mobility ?  ? ?Modified Rankin (Stroke Patients Only) ?  ? ?  ? ?Balance Overall balance assessment: Needs assistance ?Sitting-balance support: Feet supported ?Sitting balance-Leahy Scale: Good ?  ?  ?Standing balance support: Bilateral upper extremity supported ?Standing balance-Leahy Scale: Zero ?Standing balance comment: Unable to maintain standing even with heavy support from furniture and therapies ?  ?  ?  ?  ?  ?  ?  ?  ?  ?  ?  ?   ? ? ? ?Pertinent  Vitals/Pain Pain Assessment ?Pain Assessment: No/denies pain  ? ? ?Home Living Family/patient expects to be discharged to:: Private residence ?Living Arrangements: Spouse/significant other ?Available Help at Discharge: Family;Available 24 hours/day ?Type of Home: House ?Home Access: Ramped entrance ?   ?  ?  ?Home Layout: One level ?Home Equipment: Wheelchair - manual;Other (comment);Grab bars - tub/shower;Rolling Environmental consultant (2 wheels);Wheelchair - power ?   ?  ?Prior Function Prior Level of Function : Needs assist ?  ?  ?  ?  ?  ?  ?Mobility Comments: Requiring max A from wife to assist with transfers, as well as heavy pull on stationary furniture ?ADLs Comments: Assist from wife for all LB ADL's ?  ? ? ?Hand Dominance  ? Dominant Hand: Right ? ?  ?Extremity/Trunk Assessment  ? Upper Extremity Assessment ?Upper Extremity Assessment: Defer to OT evaluation ?  ? ?Lower Extremity Assessment ?Lower Extremity Assessment: Generalized weakness (patient and wife state he is unable to move legs voluntarily except potentially at the hip) ?  ? ?Cervical / Trunk Assessment ?Cervical / Trunk Assessment: Kyphotic  ?Communication  ? Communication: Expressive difficulties  ?Cognition Arousal/Alertness: Awake/alert ?Behavior During Therapy: [facility] for tasks assessed/performed ?Overall Cognitive Status: Within Functional Limits for tasks assessed ?  ?  ?  ?  ?  ?  ?  ?  ?  ?  ?  ?  ?  ?  ?  ?  ?  ?  ?  ? ?  ?General Comments General comments (skin integrity, edema, etc.): VSS on RA ? ?  ?Exercises    ? ?Assessment/Plan  ?  ?PT Assessment Patient needs continued PT services  ?PT Problem List Decreased strength;Decreased activity tolerance;Decreased balance;Decreased mobility;Decreased coordination ? ?   ?  ?PT Treatment Interventions DME instruction;Functional mobility training;Therapeutic activities;Therapeutic exercise;Balance training;Neuromuscular re-education;Patient/family education   ? ?PT Goals (Current goals can be found in the Care Plan section)  ?Acute Rehab PT Goals ?Patient Stated Goal: to get stronger ?PT Goal Formulation: With patient/family ?Time For Goal Achievement: 01/29/22 ?Potential to Achieve Goals: Fair ? ?  ?Frequency Min 3X/week ?  ? ? ?Co-evaluation PT/OT/SLP Co-Evaluation/Treatment: Yes ?Reason for  Co-Treatment: Complexity of the patient's impairments (multi-system involvement);For patient/therapist safety;To address functional/ADL transfers ?PT goals addressed during session: Mobility/safety with mobility;Strengthening/ROM ?OT goals addressed during session: Strengthening/ROM ?  ? ? ?  ?AM-PAC PT "6 Clicks" Mobility  ?Outcome Measure Help needed turning from your back to your side while in a flat bed without using bedrails?: A Little ?Help needed moving from lying on your back to sitting on the side of a flat bed without using bedrails?: A Little ?Help needed moving to and from a bed to a chair (including a wheelchair)?: Total ?Help needed standing up from a chair using your arms (e.g., wheelchair or bedside chair)?: Total ?Help needed to walk in hospital room?: Total ?Help needed climbing 3-5 steps with a railing? : Total ?6 Click Score: 10 ? ?  ?End of Session   ?Activity Tolerance: Patient tolerated treatment well ?Patient left: in bed;with call bell/phone within reach;with family/visitor present ?Nurse Communication: Mobility status ?PT Visit Diagnosis: Unsteadiness on feet (R26.81);Muscle weakness (generalized) (M62.81);Other abnormalities of gait and mobility (R26.89);Other symptoms and signs involving the nervous system (R29.898) ?  ? ?Time: 3009-2330 ?PT Time Calculation (min) (ACUTE ONLY): 36 min ? ? ?Charges:   PT Evaluation ?$PT Eval Moderate Complexity: 1 Mod ?  ?  ?   ? ? ?Samar Dass A. Gilford Rile, PT, DPT ?Acute Rehabilitation Services ?  Pager [phone] ?Office [phone] ? ? ?Blakeleigh Domek A Yunis Voorheis ?01/15/2022, 1:34 PM ? ?

## 2022-01-15 NOTE — Plan of Care (Signed)

## 2022-01-19 NOTE — Telephone Encounter (Signed)
Spoke to the patient and informed him of Dr. Joya Gaskins recommendation not to order one of the scales and the fact that there was no need for fluid restrictions at this time. Patient had no further questions at this time. ?

## 2022-01-20 ENCOUNTER — Encounter: Payer: Self-pay | Admitting: Cardiology

## 2022-01-20 DIAGNOSIS — Z7409 Other reduced mobility: Secondary | ICD-10-CM | POA: Insufficient documentation

## 2022-01-20 DIAGNOSIS — Z9181 History of falling: Secondary | ICD-10-CM | POA: Insufficient documentation

## 2022-01-20 DIAGNOSIS — D62 Acute posthemorrhagic anemia: Secondary | ICD-10-CM

## 2022-01-20 HISTORY — DX: Acute posthemorrhagic anemia: D62

## 2022-01-20 HISTORY — DX: Other reduced mobility: Z74.09

## 2022-01-20 HISTORY — DX: History of falling: Z91.81

## 2022-01-21 ENCOUNTER — Encounter: Payer: Self-pay | Admitting: Cardiology

## 2022-01-22 ENCOUNTER — Encounter: Payer: Self-pay | Admitting: Cardiology

## 2022-01-24 ENCOUNTER — Telehealth: Payer: Self-pay

## 2022-01-24 NOTE — Telephone Encounter (Signed)
Advised pt to send in [place] mobile with rapid rate or change in how he feels. Pt verbalized understand and had no additional questions. ?

## 2022-01-26 ENCOUNTER — Emergency Department (HOSPITAL_COMMUNITY)
Admission: EM | Admit: 2022-01-26 | Discharge: 2022-01-27 | Disposition: A | Discharge status: Home / Self Care | Payer: Medicare PPO | Attending: Emergency Medicine | Admitting: Emergency Medicine

## 2022-01-26 ENCOUNTER — Emergency Department (HOSPITAL_COMMUNITY): Payer: Medicare PPO

## 2022-01-26 DIAGNOSIS — R29898 Other symptoms and signs involving the musculoskeletal system: Secondary | ICD-10-CM | POA: Diagnosis not present

## 2022-01-26 DIAGNOSIS — E876 Hypokalemia: Secondary | ICD-10-CM | POA: Diagnosis not present

## 2022-01-26 DIAGNOSIS — M6281 Muscle weakness (generalized): Secondary | ICD-10-CM | POA: Diagnosis not present

## 2022-01-26 DIAGNOSIS — Z7901 Long term (current) use of anticoagulants: Secondary | ICD-10-CM | POA: Diagnosis not present

## 2022-01-26 DIAGNOSIS — R030 Elevated blood-pressure reading, without diagnosis of hypertension: Secondary | ICD-10-CM | POA: Diagnosis present

## 2022-01-26 DIAGNOSIS — R0602 Shortness of breath: Secondary | ICD-10-CM | POA: Diagnosis not present

## 2022-01-26 DIAGNOSIS — R2681 Unsteadiness on feet: Secondary | ICD-10-CM | POA: Insufficient documentation

## 2022-01-26 DIAGNOSIS — Z79899 Other long term (current) drug therapy: Secondary | ICD-10-CM | POA: Insufficient documentation

## 2022-01-26 DIAGNOSIS — R5381 Other malaise: Secondary | ICD-10-CM

## 2022-01-26 DIAGNOSIS — Z91040 Latex allergy status: Secondary | ICD-10-CM | POA: Insufficient documentation

## 2022-01-26 DIAGNOSIS — I10 Essential (primary) hypertension: Secondary | ICD-10-CM | POA: Insufficient documentation

## 2022-01-26 LAB — BASIC METABOLIC PANEL
Anion gap: 12 (ref 5–15)
BUN: 20 mg/dL (ref 8–23)
CO2: 32 mmol/L (ref 22–32)
Calcium: 8.8 mg/dL — ABNORMAL LOW (ref 8.9–10.3)
Chloride: 95 mmol/L — ABNORMAL LOW (ref 98–111)
Creatinine, Ser: 1.39 mg/dL — ABNORMAL HIGH (ref 0.61–1.24)
GFR, Estimated: 54 mL/min — ABNORMAL LOW (ref >60)
Glucose, Bld: 162 mg/dL — ABNORMAL HIGH (ref 70–99)
Potassium: 2.8 mmol/L — ABNORMAL LOW (ref 3.5–5.1)
Sodium: 139 mmol/L (ref 135–145)

## 2022-01-26 LAB — CBC WITH DIFFERENTIAL/PLATELET
Abs Immature Granulocytes: 0.04 10*3/uL (ref 0.00–0.07)
Basophils Absolute: 0.1 10*3/uL (ref 0.0–0.1)
Basophils Relative: 1 %
Eosinophils Absolute: 0.2 10*3/uL (ref 0.0–0.5)
Eosinophils Relative: 3 %
HCT: 28.4 % — ABNORMAL LOW (ref 39.0–52.0)
Hemoglobin: 8.6 g/dL — ABNORMAL LOW (ref 13.0–17.0)
Immature Granulocytes: 1 %
Lymphocytes Relative: 18 %
Lymphs Abs: 1.6 10*3/uL (ref 0.7–4.0)
MCH: 24.9 pg — ABNORMAL LOW (ref 26.0–34.0)
MCHC: 30.3 g/dL (ref 30.0–36.0)
MCV: 82.1 fL (ref 80.0–100.0)
Monocytes Absolute: 0.9 10*3/uL (ref 0.1–1.0)
Monocytes Relative: 10 %
Neutro Abs: 6 10*3/uL (ref 1.7–7.7)
Neutrophils Relative %: 67 %
Platelets: 277 10*3/uL (ref 150–400)
RBC: 3.46 MIL/uL — ABNORMAL LOW (ref 4.22–5.81)
RDW: 16 % — ABNORMAL HIGH (ref 11.5–15.5)
WBC: 8.8 10*3/uL (ref 4.0–10.5)
nRBC: 0 % (ref 0.0–0.2)

## 2022-01-26 LAB — BRAIN NATRIURETIC PEPTIDE: B Natriuretic Peptide: 353.7 pg/mL — ABNORMAL HIGH (ref 0.0–100.0)

## 2022-01-26 NOTE — ED Triage Notes (Signed)
Pt c/o several readings of hypertension, wife said he's had SHOB, worsening when flat, weakness- both onset this afternoon. Denies pain, HA, endorses "some" swelling but wearing ted hose.  ?

## 2022-01-26 NOTE — ED Provider Triage Note (Signed)
Emergency Medicine Provider Triage Evaluation Note ? ?Wendel Homeyer , a 71 y.o. male  was evaluated in triage.  Pt complains of shortness of breath.  Patient has a history of hypertension, atrial fibrillation, congestive heart failure on torsemide.  Reports that he has had gradually worsening increasing shortness of breath today without chest pain.  He has had lower extremity swelling at baseline.  Blood pressures have been running 381-771 systolic despite taking his medications.   ? ?Review of Systems  ?Positive: Shortness of breath ?Negative: Chest pain ? ?Physical Exam  ?BP (!) 173/96 (BP Location: Right Arm)   Pulse 78   Temp 98.4 ?F (36.9 ?C) (Oral)   Resp 16   SpO2 96%  ?Gen:   Awake, no distress   ?Resp:  Normal effort  ?MSK:   Moves extremities without difficulty  ?Other:  Lungs are clear to auscultation bilaterally, mild lower extremity swelling symmetric ? ?Medical Decision Making  ?Medically screening exam initiated at 10:25 PM.  Appropriate orders placed.  Eliazar Olivar was informed that the remainder of the evaluation will be completed by another provider, this initial triage assessment does not replace that evaluation, and the importance of remaining in the ED until their evaluation is complete. ? ? ?  ?Carlisle Cater, PA-C ?01/26/22 2228 ? ?

## 2022-01-27 ENCOUNTER — Other Ambulatory Visit: Payer: Self-pay

## 2022-01-27 LAB — BASIC METABOLIC PANEL
Anion gap: 8 (ref 5–15)
BUN: 19 mg/dL (ref 8–23)
CO2: 32 mmol/L (ref 22–32)
Calcium: 9.1 mg/dL (ref 8.9–10.3)
Chloride: 101 mmol/L (ref 98–111)
Creatinine, Ser: 1.39 mg/dL — ABNORMAL HIGH (ref 0.61–1.24)
GFR, Estimated: 54 mL/min — ABNORMAL LOW (ref >60)
Glucose, Bld: 182 mg/dL — ABNORMAL HIGH (ref 70–99)
Potassium: 3.4 mmol/L — ABNORMAL LOW (ref 3.5–5.1)
Sodium: 141 mmol/L (ref 135–145)

## 2022-01-27 LAB — MAGNESIUM
Magnesium: 1 mg/dL — ABNORMAL LOW (ref 1.7–2.4)
Magnesium: 1.9 mg/dL (ref 1.7–2.4)

## 2022-01-27 MED ORDER — CALCIUM GLUCONATE 10 % IV SOLN
1.0000 g | Freq: Once | INTRAVENOUS | Status: AC
Start: 2022-01-27 — End: 2022-01-27
  Administered 2022-01-27: 1 g via INTRAVENOUS
  Filled 2022-01-27: qty 10

## 2022-01-27 MED ORDER — MAGNESIUM SULFATE 4 GM/100ML IV SOLN
4.0000 g | Freq: Once | INTRAVENOUS | Status: AC
Start: 2022-01-27 — End: 2022-01-27
  Administered 2022-01-27: 4 g via INTRAVENOUS
  Filled 2022-01-27: qty 100

## 2022-01-27 MED ORDER — HYDRALAZINE HCL 20 MG/ML IJ SOLN
10.0000 mg | Freq: Once | INTRAMUSCULAR | Status: AC
  Administered 2022-01-27: 10 mg via INTRAVENOUS

## 2022-01-27 MED ORDER — CLONIDINE HCL 0.2 MG PO TABS
0.2000 mg | ORAL_TABLET | Freq: Once | ORAL | Status: AC
  Administered 2022-01-27: 0.2 mg via ORAL
  Filled 2022-01-27: qty 1

## 2022-01-27 MED ORDER — POTASSIUM CHLORIDE CRYS ER 20 MEQ PO TBCR
40.0000 meq | EXTENDED_RELEASE_TABLET | Freq: Once | ORAL | Status: AC
  Administered 2022-01-27: 40 meq via ORAL
  Filled 2022-01-27: qty 2

## 2022-01-27 MED ORDER — POTASSIUM CHLORIDE CRYS ER 20 MEQ PO TBCR
40.0000 meq | EXTENDED_RELEASE_TABLET | Freq: Once | ORAL | Status: AC
Start: 2022-01-27 — End: 2022-01-27
  Administered 2022-01-27: 40 meq via ORAL
  Filled 2022-01-27: qty 2

## 2022-01-27 MED ORDER — MAGNESIUM SULFATE 2 GM/50ML IV SOLN: 2.0000 g | Freq: Once | INTRAVENOUS | Status: DC

## 2022-01-27 MED ORDER — CARVEDILOL 12.5 MG PO TABS
25.0000 mg | ORAL_TABLET | Freq: Once | ORAL | Status: AC
  Administered 2022-01-27: 25 mg via ORAL

## 2022-01-27 NOTE — ED Provider Notes (Signed)
?  Provider Note ?MRN:  [id number]  ?Arrival date & time: 01/27/22    ?ED Course and Medical Decision Making  ?Assumed care from DR A Elayne Gruver at shift change. ? ?See not from prior team for complete details, in brief: 71 yo male ? ?Lytes abnormal ?Became brady with mag infusion, rate slowed, now tolerating. ? ?Family at bedside reports progressive decline, would like SNF placement if possible ? ?Finish lyte replacement, plan for DC home once done. ? ?Home health ordered for home ? ?Per PCP bed avail in around a week.  ? ?TOC/SW to see pt, pt has evaluated pt ? ?Repeat electrolytes reviewed, they are stable. ? ?Discussed with patient and family bedside.  They are ready go home.  They will follow-up with primary care doctor regarding home health and hospital bed for home. Will plan for discharge home with home health support.  ? ?The patient improved significantly and was discharged in stable condition. Detailed discussions were had with the patient regarding current findings, and need for close f/u with PCP or on call doctor. The patient has been instructed to return immediately if the symptoms worsen in any way for re-evaluation. Patient verbalized understanding and is in agreement with current care plan. All questions answered prior to discharge. ? ? ? ? ?Procedures ? ?Final Clinical Impressions(s) / ED Diagnoses  ? ?  ICD-10-CM   ?1. Hypomagnesemia  E83.42   ?  ?2. Hypokalemia  E87.6   ?  ?3. Physical deconditioning  R53.81   ?  ?  ?ED Discharge Orders   ? ? None  ? ?  ?  ? ? ?Discharge Instructions   ? ?  ?It was a pleasure caring for you today in the emergency department. ? ?Please return to the emergency department for any worsening or worrisome symptoms. ? ? ? ? ? ? ? ?  ?Jeanell Sparrow, DO ?01/27/22 1848 ? ?

## 2022-01-27 NOTE — ED Provider Notes (Signed)
?[facility] ?Provider Note ? ? ?CSN: [id number] ?Arrival date & time: 01/26/22  2212 ? ?  ? ?History ? ?Chief Complaint  ?Patient presents with  ? Hypertension  ? ? ?Anthony Mueller is a 71 y.o. male. ? ?The history is provided by the patient and the spouse.  ?Hypertension ?Associated symptoms include shortness of breath. Pertinent negatives include no chest pain and no abdominal pain.  ? ?  ?Patient with history of atrial fibrillation, hypertension presents with multiple complaints.  Wife reports over the past month he has had increasing generalized weakness.  He also appears short of breath at times usually when lying flat.  He has also had lower extremity edema.  Patient reports med compliance.  Prior to this ER visit, he was advised by a nurse to be evaluated due to elevated blood pressure.  No chest pain at this time.  Patient otherwise feels at his baseline. ?Home Medications ?Prior to Admission medications   ?Medication Sig Start Date End Date Taking? Authorizing Provider  ?albuterol (VENTOLIN HFA) 108 (90 Base) MCG/ACT inhaler Inhale 2 puffs into the lungs every 6 (six) hours as needed for wheezing or shortness of breath. 11/16/21  Yes Rai, Ripudeep K, MD  ?allopurinol (ZYLOPRIM) 100 MG tablet Take 100 mg by mouth in the morning.   Yes [provider]  ?apixaban (ELIQUIS) 5 MG TABS tablet Take 1 tablet (5 mg total) by mouth 2 (two) times daily. 01/17/22  Yes Pahwani, Einar Grad, MD  ?ascorbic acid (VITAMIN C) 500 MG tablet Take 1 tablet (500 mg total) by mouth daily. 11/17/21  Yes Rai, Ripudeep K, MD  ?candesartan (ATACAND) 32 MG tablet Take 32 mg by mouth daily. 09/13/21  Yes [provider]  ?carvedilol (COREG) 25 MG tablet Take 25 mg by mouth 2 (two) times daily. 10/19/21  Yes [provider]  ?cloNIDine (CATAPRES) 0.2 MG tablet Take 0.2 mg by mouth 2 (two) times daily. 10/28/21  Yes [provider]  ?Ferrous Sulfate (IRON SLOW RELEASE) 143  (45 Fe) MG TBCR Take 143 mg by mouth every evening.   Yes [provider]  ?lovastatin (MEVACOR) 40 MG tablet Take 40 mg by mouth every evening. 09/20/21  Yes [provider]  ?metFORMIN (GLUCOPHAGE) 500 MG tablet Take 1,000 mg by mouth 2 (two) times daily. 08/17/21  Yes [provider]  ?omeprazole (PRILOSEC) 20 MG capsule Take 40 mg by mouth daily. 11/04/21  Yes [provider]  ?oxaprozin (DAYPRO) 600 MG tablet Take 1,200 mg by mouth daily as needed (Gout).   Yes [provider]  ?tamsulosin (FLOMAX) 0.4 MG CAPS capsule Take 0.4 mg by mouth daily. 08/30/21  Yes [provider]  ?timolol (TIMOPTIC) 0.5 % ophthalmic solution Place 1 drop into both eyes daily. 09/13/21  Yes [provider]  ?torsemide (DEMADEX) 20 MG tablet Take 1 tablet (20 mg total) by mouth daily. 01/15/22 02/14/22 Yes Pahwani, Einar Grad, MD  ?TRADJENTA 5 MG TABS tablet Take 5 mg by mouth daily. 09/20/21  Yes [provider]  ?zinc sulfate 220 (50 Zn) MG capsule Take 1 capsule (220 mg total) by mouth daily. 11/17/21  Yes Rai, Vernelle Emerald, MD  ?   ? ?Allergies    ?Liraglutide, Hydrocodone, Hydrocodone-acetaminophen, Other, Tape, Latex, and Metronidazole   ? ?Review of Systems   ?Review of Systems  ?Constitutional:  Negative for fever.  ?Respiratory:  Positive for shortness of breath.   ?Cardiovascular:  Positive for leg swelling. Negative for chest  pain.  ?Gastrointestinal:  Negative for abdominal pain.  ?Neurological:  Negative for syncope.  ? ?Physical Exam ?Updated Vital Signs ?BP (!) 186/80   Pulse 74   Temp 98.4 ?F (36.9 ?C) (Oral)   Resp 18   Ht 1.854 m ('6\' 1"'$ )   Wt 108.9 kg   SpO2 96%   BMI 31.66 kg/m?  ?Physical Exam ?CONSTITUTIONAL: Elderly, no acute distress ?HEAD: Normocephalic/atraumatic ?EYES: EOMI/PERRL ?ENMT: Mucous membranes moist ?NECK: supple no meningeal signs ?SPINE/BACK:entire spine nontender ?CV: Irregular, no loud murmur ?LUNGS: Lungs are clear to  auscultation bilaterally, no apparent distress ?ABDOMEN: soft, nontender ?NEURO: Pt is awake/alert/appropriate, moves all extremitiesx4.  No facial droop.   ?EXTREMITIES: pulses normal/equal, full ROM, symmetric edema to bilateral lower extremities ?SKIN: warm, color normal ?PSYCH: no abnormalities of mood noted, alert and oriented to situation ? ?ED Results / Procedures / Treatments   ?Labs ?(all labs ordered are listed, but only abnormal results are displayed) ?Labs Reviewed  ?BASIC METABOLIC PANEL - Abnormal; Notable for the following components:  ?    Result Value  ? Potassium 2.8 (*)   ? Chloride 95 (*)   ? Glucose, Bld 162 (*)   ? Creatinine, Ser 1.39 (*)   ? Calcium 8.8 (*)   ? GFR, Estimated 54 (*)   ? All other components within normal limits  ?CBC WITH DIFFERENTIAL/PLATELET - Abnormal; Notable for the following components:  ? RBC 3.46 (*)   ? Hemoglobin 8.6 (*)   ? HCT 28.4 (*)   ? MCH 24.9 (*)   ? RDW 16.0 (*)   ? All other components within normal limits  ?BRAIN NATRIURETIC PEPTIDE - Abnormal; Notable for the following components:  ? B Natriuretic Peptide 353.7 (*)   ? All other components within normal limits  ?MAGNESIUM - Abnormal; Notable for the following components:  ? Magnesium 1.0 (*)   ? All other components within normal limits  ? ? ?EKG ?EKG Interpretation ? ?Date/Time:  Thursday January 27 2022 04:49:54 EDT ?Ventricular Rate:  65 ?PR Interval:  146 ?QRS Duration: 96 ?QT Interval:  446 ?QTC Calculation: 464 ?R Axis:   79 ?Text Interpretation: Unknown rhythm, irregular rate Probable anteroseptal infarct, old Borderline ST depression, anterolateral leads Confirmed by Ripley Fraise [phone]) on 01/27/2022 5:06:51 AM ? ?Radiology ?DG Chest 2 View ? ?Result Date: 01/26/2022 ?CLINICAL DATA:  Shortness of breath EXAM: CHEST - 2 VIEW COMPARISON:  01/13/2022 FINDINGS: Cardiac shadow is within normal limits. The lungs are well aerated bilaterally. No focal infiltrate is seen. Small right posterior effusion  is noted. No bony abnormality is noted. IMPRESSION: Small right pleural effusion.  No other focal abnormality is noted. Electronically Signed   By: Inez Catalina M.D.   On: 01/26/2022 23:17   ? ?Procedures ?Procedures  ? ? ?Medications Ordered in ED ?Medications  ?magnesium sulfate IVPB 4 g 100 mL (has no administration in time range)  ?potassium chloride SA (KLOR-CON M) CR tablet 40 mEq (has no administration in time range)  ?potassium chloride SA (KLOR-CON M) CR tablet 40 mEq (40 mEq Oral Given 01/27/22 0605)  ? ? ?ED Course/ Medical Decision Making/ A&P ?Clinical Course as of 01/27/22 0728  ?Thu Jan 27, 2022  ?[place] at signout to Dr Pearline Cables is to replace magnesium and potassium.  Pharmacy recommends 4 g of magnesium and then recheck.  If improved patient can be discharged home [DW]  ?0720 Patient's had multiple vague complaints over the past several weeks.  Intermittent shortness of  breath and fatigue.  Wife reports he appears overall weak at times but no acute issues tonight.  ER visit was triggered by home health nurse who recommended evaluation due to elevated blood pressure [DW]  ?  ?Clinical Course User Index ?[DW] Ripley Fraise, MD  ? ?                        ?Medical Decision Making ?Amount and/or Complexity of Data Reviewed ?Labs: ordered. ?Radiology: ordered. ? ?Risk ?Prescription drug management. ? ? ?This patient presents to the ED for concern of hypertension generalized weakness, this involves an extensive number of treatment options, and is a complaint that carries with it a high risk of complications and morbidity.  The differential diagnosis includes electrolyte abnormality, acute coronary syndrome, pneumonia, anemia ? ?Comorbidities that complicate the patient evaluation: ?Patient?s presentation is complicated by their history of atrial fibrillation ? ? ?Additional history obtained: ?Additional history obtained from family and spouse ?Records reviewed previous admission documents ? ?Lab  Tests: ?I Ordered, and personally interpreted labs.  The pertinent results include: Hypomagnesemia and hypokalemia ? ?Imaging Studies ordered: ?I ordered imaging studies including X-ray chest   ?I independently visualized and

## 2022-01-27 NOTE — ED Provider Notes (Signed)
? ?7:19 AM ?Patient signed out to me by previous ED physician. Pt is a 71 yo male presenting for weakness. Labs pertinent for hypokalemia and hypomagnesemia secondary to diuretic use.  ? ?K 2.8 ?Mg 1.0 ? ?Plan: Replace and DC.  ? ? ?Physical Exam  ?BP (!) 186/80   Pulse 74   Temp 98.4 ?F (36.9 ?C) (Oral)   Resp 18   Ht '6\' 1"'$  (1.854 m)   Wt 108.9 kg   SpO2 96%   BMI 31.66 kg/m?  ? ?Physical Exam ?Vitals and nursing note reviewed.  ?Constitutional:   ?   General: He is not in acute distress. ?   Appearance: He is well-developed.  ?HENT:  ?   Head: Normocephalic and atraumatic.  ?Eyes:  ?   Conjunctiva/sclera: Conjunctivae normal.  ?Cardiovascular:  ?   Rate and Rhythm: Normal rate and regular rhythm.  ?   Heart sounds: No murmur heard. ?Pulmonary:  ?   Effort: Pulmonary effort is normal. No respiratory distress.  ?   Breath sounds: Normal breath sounds.  ?Abdominal:  ?   Palpations: Abdomen is soft.  ?   Tenderness: There is no abdominal tenderness.  ?Musculoskeletal:     ?   General: No swelling.  ?   Cervical back: Neck supple.  ?Skin: ?   General: Skin is warm and dry.  ?   Capillary Refill: Capillary refill takes less than 2 seconds.  ?Neurological:  ?   Mental Status: He is alert.  ?Psychiatric:     ?   Mood and Affect: Mood normal.  ? ? ?Procedures  ?Procedures ? ?ED Course / MDM  ? ?Clinical Course as of 01/27/22 0830  ?Thu Jan 27, 2022  ?[place] at signout to Dr Pearline Cables is to replace magnesium and potassium.  Pharmacy recommends 4 g of magnesium and then recheck.  If improved patient can be discharged home [DW]  ?0720 Patient's had multiple vague complaints over the past several weeks.  Intermittent shortness of breath and fatigue.  Wife reports he appears overall weak at times but no acute issues tonight.  ER visit was triggered by home health nurse who recommended evaluation due to elevated blood pressure [DW]  ?  ?Clinical Course User Index ?[DW] Ripley Fraise, MD  ? ?Medical Decision  Making ?Amount and/or Complexity of Data Reviewed ?Labs: ordered. ?Radiology: ordered. ? ?Risk ?Prescription drug management. ? ?8:30 AM ?Called to bedside by nursing personal. Patient who was originally in afib with a rate of 60-70 became bradycardic with a rate of 36 bpm and bp from 170/ to 124/ approx five min after magnesium infusion began. Magnesium infusion stopped at this time. Calcium gluconate given. Plan to wait for heart rate recovery then restart magnesium at a slower rate-1g per hour as opposed to 1g per 30 min.  ? ? ?1:13 PM ?HR and BP stable. Magnesium restarted.  ?Wife at bedside requesting social work consult for questions regarding rehab placement.  Wife requesting that patient be admitted for declining functional status. States after recent hemorrhoidectomy patient has become progressively weaker. Pcp told her to come in to get admitted for rehabiliation. States he is getting so severe that he cannot even transfer to the toilet with her and her son's assistance.Social work sent to bedside with resources.  ? ?I spoke with patient's pcp regarding electrolyte abnormalities likely being secondary to diuretic use. Patient was seen previously for hypomag and diuretic dose was cut in half. Recommending to discontinue at this time  if recurrent hypomag and hypokalemia results. ? ? Pcp states patient has been accepted to Rehab facility with bed ready but pending completion of paperwork/insurance approval. Patient signed out to on coming provider. Plans for home health referral until next week when ready for Rehab facility.  ? ? ? ?  ?Lianne Cure, DO ?67/20/91 0820 ? ?

## 2022-01-27 NOTE — Discharge Instructions (Addendum)
It was a pleasure caring for you today in the emergency department. ° °Please return to the emergency department for any worsening or worrisome symptoms. ° ° °

## 2022-01-27 NOTE — Evaluation (Signed)
Physical Therapy Evaluation ?Patient Details ?Name: Anthony Mueller ?MRN: [id number] ?DOB: 1951-07-12 ?Today's Date: 01/27/2022 ? ?History of Present Illness ? Pt is a 71 y/o male admitted secondary to weakness and HTN. Recent admission on 3/18 secondary to abnormal labs. PMH includes henorrhoidectomy, DM, HTN, cerebellar degeneration, A fib.  ?Clinical Impression ? Pt admitted secondary to problem above with deficits below. Pt requiring mod A for bed mobility and max A for transfers. Pt only able to maintain standing for very brief period. Pt and pt's wife reports pt has become weaker over the past few months and is unable to assist with transfers as he used to be able to. Feel he would benefit from SNF level therapies, however, if he does not qualify, recommending max HH services and DME below. Will continue to follow acutely.    ?   ? ?Recommendations for follow up therapy are one component of a multi-disciplinary discharge planning process, led by the attending physician.  Recommendations may be updated based on patient status, additional functional criteria and insurance authorization. ? ?Follow Up Recommendations Skilled nursing-short term rehab (<3 hours/day) (max HH if pt to return home) ? ?  ?Assistance Recommended at Discharge Frequent or constant Supervision/Assistance  ?Patient can return home with the following ? Two people to help with walking and/or transfers;Two people to help with bathing/dressing/bathroom;Assistance with cooking/housework;Assist for transportation ? ?  ?Equipment Recommendations Hospital bed;Other (comment) (hoyer lift with pad)  ?Recommendations for Other Services ?    ?  ?Functional Status Assessment Patient has had a recent decline in their functional status and demonstrates the ability to make significant improvements in function in a reasonable and predictable amount of time.  ? ?  ?Precautions / Restrictions Precautions ?Precautions: Fall ?Restrictions ?Weight Bearing  Restrictions: No  ? ?  ? ?Mobility ? Bed Mobility ?Overal bed mobility: Needs Assistance ?Bed Mobility: Supine to Sit, Sit to Supine ?  ?  ?Supine to sit: Mod assist ?Sit to supine: Mod assist ?  ?General bed mobility comments: Required mod A for trunk and LE assist. Required assist to scoot hips to EOB on stretcher as well. ?  ? ?Transfers ?Overall transfer level: Needs assistance ?Equipment used: 1 person hand held assist ?Transfers: Sit to/from Stand ?Sit to Stand: Max assist ?  ?  ?  ?  ?  ?General transfer comment: Max A for lift assist and steadying to stand. Only able to maintain brief period in standing secondary to instability. ?  ? ?Ambulation/Gait ?  ?  ?  ?  ?  ?  ?  ?General Gait Details: WC bound at baseline ? ?Stairs ?  ?  ?  ?  ?  ? ?Wheelchair Mobility ?  ? ?Modified Rankin (Stroke Patients Only) ?  ? ?  ? ?Balance Overall balance assessment: Needs assistance ?Sitting-balance support: Feet supported ?Sitting balance-Leahy Scale: Fair ?  ?  ?Standing balance support: Bilateral upper extremity supported ?Standing balance-Leahy Scale: Zero ?Standing balance comment: Unable to maintain standing even with max A ?  ?  ?  ?  ?  ?  ?  ?  ?  ?  ?  ?   ? ? ? ?Pertinent Vitals/Pain Pain Assessment ?Pain Assessment: No/denies pain  ? ? ?Home Living Family/patient expects to be discharged to:: Private residence ?Living Arrangements: Spouse/significant other ?Available Help at Discharge: Family;Available 24 hours/day ?Type of Home: House ?Home Access: Ramped entrance ?  ?  ?  ?Home Layout: One level ?Home Equipment: Wheelchair - manual;Grab  bars - tub/shower;Rolling Walker (2 wheels);Wheelchair - power ?   ?  ?Prior Function Prior Level of Function : Needs assist ?  ?  ?  ?  ?  ?  ?Mobility Comments: Requiring assist for wife consistently and +2 at times for transfers. ?ADLs Comments: Assist from wife for all LB ADL's ?  ? ? ?Hand Dominance  ? Dominant Hand: Right ? ?  ?Extremity/Trunk Assessment  ? Upper  Extremity Assessment ?Upper Extremity Assessment: Defer to OT evaluation ?  ? ?Lower Extremity Assessment ?Lower Extremity Assessment: Generalized weakness ?  ? ?Cervical / Trunk Assessment ?Cervical / Trunk Assessment: Kyphotic  ?Communication  ? Communication: Expressive difficulties  ?Cognition Arousal/Alertness: Awake/alert ?Behavior During Therapy: [facility] for tasks assessed/performed ?Overall Cognitive Status: Within Functional Limits for tasks assessed ?  ?  ?  ?  ?  ?  ?  ?  ?  ?  ?  ?  ?  ?  ?  ?  ?  ?  ?  ? ?  ?General Comments   ? ?  ?Exercises    ? ?Assessment/Plan  ?  ?PT Assessment Patient needs continued PT services  ?PT Problem List Decreased strength;Decreased activity tolerance;Decreased balance;Decreased mobility;Decreased coordination ? ?   ?  ?PT Treatment Interventions DME instruction;Functional mobility training;Therapeutic activities;Therapeutic exercise;Balance training;Patient/family education   ? ?PT Goals (Current goals can be found in the Care Plan section)  ?Acute Rehab PT Goals ?Patient Stated Goal: to get stronger ?PT Goal Formulation: With patient/family ?Time For Goal Achievement: 02/10/22 ?Potential to Achieve Goals: Fair ? ?  ?Frequency Min 2X/week ?  ? ? ?Co-evaluation PT/OT/SLP Co-Evaluation/Treatment: Yes ?Reason for Co-Treatment: Complexity of the patient's impairments (multi-system involvement);Necessary to address cognition/behavior during functional activity ?PT goals addressed during session: Mobility/safety with mobility;Balance ?  ?  ? ? ?  ?AM-PAC PT "6 Clicks" Mobility  ?Outcome Measure Help needed turning from your back to your side while in a flat bed without using bedrails?: A Little ?Help needed moving from lying on your back to sitting on the side of a flat bed without using bedrails?: A Lot ?Help needed moving to and from a bed to a chair (including a wheelchair)?: Total ?Help needed standing up from a chair using your arms (e.g., wheelchair or bedside chair)?: A  Lot ?Help needed to walk in hospital room?: Total ?Help needed climbing 3-5 steps with a railing? : Total ?6 Click Score: 10 ? ?  ?End of Session Equipment Utilized During Treatment: Gait belt ?Activity Tolerance: Patient tolerated treatment well ?Patient left: in bed;with call bell/phone within reach;with family/visitor present (on stretcher in ED) ?Nurse Communication: Mobility status ?PT Visit Diagnosis: Unsteadiness on feet (R26.81);Muscle weakness (generalized) (M62.81);Other abnormalities of gait and mobility (R26.89);Other symptoms and signs involving the nervous system (R29.898) ?  ? ?Time: 2831-5176 ?PT Time Calculation (min) (ACUTE ONLY): 32 min ? ? ?Charges:   PT Evaluation ?$PT Eval Moderate Complexity: 1 Mod ?PT Treatments ?$Therapeutic Activity: 8-22 mins ?  ?   ? ? ?Reuel Derby, PT, DPT  ?Acute Rehabilitation Services  ?Pager: [phone] ?Office: [phone] ? ? ?[place] ?01/27/2022, 4:43 PM ?

## 2022-01-27 NOTE — ED Notes (Signed)
MD Pearline Cables requested that mag infusion be stopped due to possibly causing bradycardia. Infusion stopped per order, pt remains on cardiac monitor in no acute distress. Normotensive to hypertensive, no acute complaints.  ?

## 2022-01-27 NOTE — ED Notes (Signed)
Pt verbalized understanding of d/c instructions, meds, and followup care. Denies questions. VSS, no distress noted. Steady gait to exit with all belongings.  ?

## 2022-01-27 NOTE — ED Notes (Signed)
Pt noted to be developing bradycardic episodes. Maintaining rate in the 50s, but dipping down to the high 40s. BP maintaining in the 170s. MD Pearline Cables notified of new bradycardic episodes ?

## 2022-01-27 NOTE — ED Notes (Signed)
Informed RN of heart monitor events. ?

## 2022-01-27 NOTE — Discharge Planning (Signed)
RNCM consulted regarding safe discharge planning (Home with Home Health vs Skilled Nursing Facility Placement).  Physical Therapy evaluation placed; will follow up after recommendations from PT.     

## 2022-01-29 ENCOUNTER — Encounter: Payer: Self-pay | Admitting: Cardiology

## 2022-01-31 ENCOUNTER — Telehealth: Payer: Self-pay | Admitting: Cardiology

## 2022-01-31 DIAGNOSIS — Z7901 Long term (current) use of anticoagulants: Secondary | ICD-10-CM

## 2022-01-31 DIAGNOSIS — I5032 Chronic diastolic (congestive) heart failure: Secondary | ICD-10-CM

## 2022-01-31 DIAGNOSIS — I4819 Other persistent atrial fibrillation: Secondary | ICD-10-CM

## 2022-01-31 DIAGNOSIS — D509 Iron deficiency anemia, unspecified: Secondary | ICD-10-CM

## 2022-01-31 DIAGNOSIS — I110 Hypertensive heart disease with heart failure: Secondary | ICD-10-CM

## 2022-01-31 DIAGNOSIS — R5381 Other malaise: Secondary | ICD-10-CM

## 2022-01-31 NOTE — Telephone Encounter (Signed)
Wife called stating patient has been hospitalized every 3-5 days to get IV magnesium. She wants to know if his diuretic needs to be changed.  ?Patient is currently at [facility].   ?

## 2022-01-31 NOTE — Telephone Encounter (Signed)
Pt c/o Shortness Of Breath: STAT if SOB developed within the last 24 hours or pt is noticeably SOB on the phone ? ?1. Are you currently SOB (can you hear that pt is SOB on the phone)? yes ? ?2. How long have you been experiencing SOB?  ?Last Thursday 3/30 ? ?3. Are you SOB when sitting or when up moving around? Both ? ?4. Are you currently experiencing any other symptoms? Weak ? ? ?Pt's wife states he's been SOB since stopping the Torsemide. Elevated BP ( 220/126)and wheezing. ?

## 2022-01-31 NOTE — Telephone Encounter (Signed)
Secure chatted nurse,nurse stated that she would have to call pt from her cell phone do to their phones not working at the time. Gave nurse pt's telephone number ?

## 2022-01-31 NOTE — Telephone Encounter (Signed)
Spoke with pt's wife who states that the pt is having increased shortness of breath and elevated BP. Advised to take his morning medications for his BP and go to the ED for evaluation. Pt's wife verbalized understanding and had no additional questions. ?

## 2022-02-01 ENCOUNTER — Ambulatory Visit: Payer: Medicare PPO | Admitting: Cardiology

## 2022-02-01 DIAGNOSIS — Z7901 Long term (current) use of anticoagulants: Secondary | ICD-10-CM | POA: Diagnosis not present

## 2022-02-01 DIAGNOSIS — I5032 Chronic diastolic (congestive) heart failure: Secondary | ICD-10-CM | POA: Diagnosis not present

## 2022-02-01 DIAGNOSIS — I110 Hypertensive heart disease with heart failure: Secondary | ICD-10-CM | POA: Diagnosis not present

## 2022-02-01 DIAGNOSIS — D649 Anemia, unspecified: Secondary | ICD-10-CM

## 2022-02-01 DIAGNOSIS — I4819 Other persistent atrial fibrillation: Secondary | ICD-10-CM | POA: Diagnosis not present

## 2022-02-02 DIAGNOSIS — I4819 Other persistent atrial fibrillation: Secondary | ICD-10-CM | POA: Diagnosis not present

## 2022-02-02 DIAGNOSIS — Z7901 Long term (current) use of anticoagulants: Secondary | ICD-10-CM | POA: Diagnosis not present

## 2022-02-02 DIAGNOSIS — I5032 Chronic diastolic (congestive) heart failure: Secondary | ICD-10-CM | POA: Diagnosis not present

## 2022-02-02 DIAGNOSIS — I110 Hypertensive heart disease with heart failure: Secondary | ICD-10-CM | POA: Diagnosis not present

## 2022-02-03 DIAGNOSIS — I5032 Chronic diastolic (congestive) heart failure: Secondary | ICD-10-CM | POA: Diagnosis not present

## 2022-02-03 DIAGNOSIS — Z7901 Long term (current) use of anticoagulants: Secondary | ICD-10-CM | POA: Diagnosis not present

## 2022-02-03 DIAGNOSIS — I4819 Other persistent atrial fibrillation: Secondary | ICD-10-CM | POA: Diagnosis not present

## 2022-02-03 DIAGNOSIS — I110 Hypertensive heart disease with heart failure: Secondary | ICD-10-CM | POA: Diagnosis not present

## 2022-02-04 DIAGNOSIS — I4819 Other persistent atrial fibrillation: Secondary | ICD-10-CM | POA: Diagnosis not present

## 2022-02-04 DIAGNOSIS — I5032 Chronic diastolic (congestive) heart failure: Secondary | ICD-10-CM | POA: Diagnosis not present

## 2022-02-04 DIAGNOSIS — Z7901 Long term (current) use of anticoagulants: Secondary | ICD-10-CM | POA: Diagnosis not present

## 2022-02-04 DIAGNOSIS — I110 Hypertensive heart disease with heart failure: Secondary | ICD-10-CM | POA: Diagnosis not present

## 2022-02-16 ENCOUNTER — Ambulatory Visit: Payer: Medicare PPO | Admitting: Cardiology

## 2022-02-28 ENCOUNTER — Other Ambulatory Visit: Payer: Self-pay | Admitting: Oncology

## 2022-02-28 DIAGNOSIS — Z09 Encounter for follow-up examination after completed treatment for conditions other than malignant neoplasm: Secondary | ICD-10-CM

## 2022-02-28 DIAGNOSIS — D649 Anemia, unspecified: Secondary | ICD-10-CM

## 2022-02-28 DIAGNOSIS — K8000 Calculus of gallbladder with acute cholecystitis without obstruction: Secondary | ICD-10-CM

## 2022-02-28 HISTORY — DX: Encounter for follow-up examination after completed treatment for conditions other than malignant neoplasm: Z09

## 2022-02-28 HISTORY — DX: Calculus of gallbladder with acute cholecystitis without obstruction: K80.00

## 2022-02-28 NOTE — Progress Notes (Signed)
?Anthony Mueller  ?[address] ?[place],  [place]  [address] ?[phone] ? ?Clinic Day:  03/01/2022 ? ?Referring physician: Maris Berger, MD ? ?HISTORY OF PRESENT ILLNESS:  ?The patient is a 71 y.o. male  who I was asked to consult upon for iron deficiency anemia.  Recent labs showed a low hemoglobin of 9.7, with a low MCV of 79.3.  Iron studies done recently showed a low ferritin of 11.2, a low serum iron of 14, a TIBC of 383, and a low iron saturation of 3.6%.  Of note, he also had a low B12 level of 215.  The patient has known about his anemia for at least the past 5 years.  He has had very heavy rectal bleeding in the past.  He did have a colonoscopy last year, for which bleeding hemorrhoids were appreciated.  The patient did undergo an external hemorrhoidectomy in early March 2023 as this was thought to be the source of his heavy rectal bleeding.  He was given IV iron before that surgery.   Less than a month later, a blood transfusion was given as his hemoglobin was suboptimal.  Fortunately, since his hemorrhoidectomy, he has not had further GI blood loss.  This gentleman also had a Foley catheter placed in mid April 2023 due to his inability to void.  His wife states that blood clots were in his Foley catheter for the first week after it was placed.  They have not seen any since then.  He denies ever having hemoptysis or hematemesis.  Of note, the patient has been taking iron pills for the past 5 years. ? ?PAST MEDICAL HISTORY:  ? ?Past Medical History:  ?Diagnosis Date  ? Anemia   ? Cerebellar degeneration ([place])   ? Diabetes mellitus without complication ([place])   ? GERD (gastroesophageal reflux disease)   ? Hyperlipidemia   ? Hypertension   ? Skin cancer   ? ? ?PAST SURGICAL HISTORY:  ? ?Past Surgical History:  ?Procedure Laterality Date  ? APPENDECTOMY    ? CATARACT EXTRACTION, BILATERAL    ? CHOLECYSTECTOMY    ? HEMORRHOID SURGERY    ? POLYPECTOMY    ? ULNAR NERVE REPAIR  Left   ? ? ?CURRENT MEDICATIONS:  ? ?Current Outpatient Medications  ?Medication Sig Dispense Refill  ? albuterol (VENTOLIN HFA) 108 (90 Base) MCG/ACT inhaler Inhale 2 puffs into the lungs every 6 (six) hours as needed for wheezing or shortness of breath. 18 g 1  ? allopurinol (ZYLOPRIM) 100 MG tablet Take 100 mg by mouth in the morning.    ? apixaban (ELIQUIS) 5 MG TABS tablet Take 1 tablet (5 mg total) by mouth 2 (two) times daily. 60 tablet 0  ? ascorbic acid (VITAMIN C) 500 MG tablet Take 1 tablet (500 mg total) by mouth daily. 30 tablet 0  ? candesartan (ATACAND) 32 MG tablet Take 32 mg by mouth daily.    ? carvedilol (COREG) 25 MG tablet Take 25 mg by mouth 2 (two) times daily.    ? cloNIDine (CATAPRES) 0.2 MG tablet Take 0.2 mg by mouth 2 (two) times daily.    ? Ferrous Sulfate (IRON SLOW RELEASE) 143 (45 Fe) MG TBCR Take 143 mg by mouth every evening.    ? lovastatin (MEVACOR) 40 MG tablet Take 40 mg by mouth every evening.    ? metFORMIN (GLUCOPHAGE) 500 MG tablet Take 1,000 mg by mouth 2 (two) times daily.    ? omeprazole (  PRILOSEC) 20 MG capsule Take 40 mg by mouth daily.    ? oxaprozin (DAYPRO) 600 MG tablet Take 1,200 mg by mouth daily as needed (Gout).    ? tamsulosin (FLOMAX) 0.4 MG CAPS capsule Take 0.4 mg by mouth daily.    ? timolol (TIMOPTIC) 0.5 % ophthalmic solution Place 1 drop into both eyes daily.    ? torsemide (DEMADEX) 20 MG tablet Take 1 tablet (20 mg total) by mouth daily. 30 tablet 0  ? TRADJENTA 5 MG TABS tablet Take 5 mg by mouth daily.    ? zinc sulfate 220 (50 Zn) MG capsule Take 1 capsule (220 mg total) by mouth daily. 30 capsule 0  ? ?No current facility-administered medications for this visit.  ? ? ?ALLERGIES:  ? ?Allergies  ?Allergen Reactions  ? Liraglutide   ?  Other reaction(s): Other (See Comments) ?CAN NOT TAKE BECAUSE A SIDE EFFECT IS PANCREATITIS AND HE HAS A HISTORY ?Contraindicated due to pancreatitis ?  ? Hydrocodone   ?  Other reaction(s): Other (See Comments) ?MAKES  FEEL SPACEY AND LIGHT HEADED  ? Hydrocodone-Acetaminophen   ?  Other reaction(s): Other (See Comments) ?Feels " orbity"  ? Other   ?  Other reaction(s): Other (See Comments) ?BANDAID  CAUSES RASH  ? Tape Other (See Comments)  ?  Skin irritation, welts  ? Latex Rash  ?  Band-aids  ? Metronidazole Nausea And Vomiting  ?  Other reaction(s): GI Upset (intolerance) ? AND DEPRESSION ;  TABLETS ?Does fine w/ IV Flagyl ?  ? ? ?FAMILY HISTORY:  ? ?Family History  ?Problem Relation Age of Onset  ? Stroke Mother   ? Hypertension Mother   ? Diabetes Mother   ? Hyperlipidemia Mother   ? Colon cancer Father   ? Healthy Sister   ?His mother had a history of ovarian cancer before ultimately died from a stroke.  His father died from metastatic colon cancer. ? ?SOCIAL HISTORY:  ?The patient was born and raised in [place].  He currently lives in [place] with his wife for 33 years.  They have 2 children.  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  He drinks alcohol on rare occasions.  He briefly smoked a pipe while in college. ? ?REVIEW OF SYSTEMS:  ?Review of Systems  ?Constitutional:  Positive for fatigue. Negative for fever and unexpected weight change.  ?Eyes:  Positive for eye problems.  ?Respiratory:  Negative for chest tightness, cough, hemoptysis and shortness of breath.   ?Cardiovascular:  Negative for chest pain and palpitations.  ?Gastrointestinal:  Positive for nausea and vomiting. Negative for abdominal distention, abdominal pain, blood in stool, constipation and diarrhea.  ?Genitourinary:  Negative for dysuria, frequency and hematuria.   ?Musculoskeletal:  Negative for arthralgias, back pain and myalgias.  ?Skin:  Positive for wound (Decubitus ulcer). Negative for itching and rash.  ?Neurological:  Negative for dizziness, headaches and light-headedness.  ?Psychiatric/Behavioral:  Negative for depression and suicidal ideas. The patient is not nervous/anxious.    ? ?PHYSICAL EXAM:  ?Blood pressure  (!) 160/79, pulse 73, temperature 98 ?F (36.7 ?C), resp. rate 16, height '6\' 1"'$  (1.854 m), SpO2 94 %. ?Wt Readings from Last 3 Encounters:  ?01/27/22 240 lb (108.9 kg)  ?01/15/22 240 lb 8.4 oz (109.1 kg)  ?01/05/22 245 lb (111.1 kg)  ? ?Body mass index is 31.66 kg/m?Marland Kitchen ?Performance status (ECOG): 2 - Symptomatic, <50% confined to bed ?Physical Exam ?Constitutional:   ?  Appearance: Normal appearance. He is not ill-appearing.  ?   Comments: A chronically ill-appearing gentleman who is in a wheelchair.  A Foley catheter is in place  ?HENT:  ?   Mouth/Throat:  ?   Mouth: Mucous membranes are moist.  ?   Pharynx: Oropharynx is clear. No oropharyngeal exudate or posterior oropharyngeal erythema.  ?Cardiovascular:  ?   Rate and Rhythm: Normal rate and regular rhythm.  ?   Heart sounds: No murmur heard. ?  No friction rub. No gallop.  ?Pulmonary:  ?   Effort: Pulmonary effort is normal. No respiratory distress.  ?   Breath sounds: Normal breath sounds. No wheezing, rhonchi or rales.  ?Abdominal:  ?   General: Bowel sounds are normal. There is no distension.  ?   Palpations: Abdomen is soft. There is no mass.  ?   Tenderness: There is no abdominal tenderness.  ?Musculoskeletal:     ?   General: No swelling.  ?   Right lower leg: No edema.  ?   Left lower leg: No edema.  ?Lymphadenopathy:  ?   Cervical: No cervical adenopathy.  ?   Upper Body:  ?   Right upper body: No supraclavicular or axillary adenopathy.  ?   Left upper body: No supraclavicular or axillary adenopathy.  ?   Lower Body: No right inguinal adenopathy. No left inguinal adenopathy.  ?Skin: ?   General: Skin is warm.  ?   Coloration: Skin is not jaundiced.  ?   Findings: No lesion or rash.  ?Neurological:  ?   General: No focal deficit present.  ?   Mental Status: He is alert and oriented to person, place, and time. Mental status is at baseline.  ?Psychiatric:     ?   Mood and Affect: Mood normal.     ?   Behavior: Behavior normal.     ?   Thought Content:  Thought content normal.  ? ? ?LABS:  ? ? ? ? ?ASSESSMENT & PLAN:  ?A 71 y.o. male who I was asked to consult upon for iron deficiency anemia.  I will arrange for him to receive IV iron over these next few we

## 2022-03-01 ENCOUNTER — Other Ambulatory Visit: Payer: Self-pay

## 2022-03-01 ENCOUNTER — Inpatient Hospital Stay: Payer: Medicare PPO | Attending: Oncology | Admitting: Oncology

## 2022-03-01 ENCOUNTER — Encounter: Payer: Self-pay | Admitting: Oncology

## 2022-03-01 ENCOUNTER — Inpatient Hospital Stay: Payer: Medicare PPO

## 2022-03-01 DIAGNOSIS — E538 Deficiency of other specified B group vitamins: Secondary | ICD-10-CM

## 2022-03-01 DIAGNOSIS — D509 Iron deficiency anemia, unspecified: Secondary | ICD-10-CM | POA: Insufficient documentation

## 2022-03-01 DIAGNOSIS — D500 Iron deficiency anemia secondary to blood loss (chronic): Secondary | ICD-10-CM

## 2022-03-01 DIAGNOSIS — D649 Anemia, unspecified: Secondary | ICD-10-CM

## 2022-03-01 HISTORY — DX: Iron deficiency anemia secondary to blood loss (chronic): D50.0

## 2022-03-01 HISTORY — DX: Deficiency of other specified B group vitamins: E53.8

## 2022-03-01 LAB — CBC AND DIFFERENTIAL
HCT: 33 — AB (ref 41–53)
Hemoglobin: 10.1 — AB (ref 13.5–17.5)
Neutrophils Absolute: 4.22
Platelets: 205 10*3/uL (ref 150–400)
WBC: 6.4

## 2022-03-01 LAB — CBC: RBC: 4.14 (ref 3.87–5.11)

## 2022-03-16 ENCOUNTER — Encounter: Payer: Self-pay | Admitting: Oncology

## 2022-03-17 ENCOUNTER — Telehealth: Payer: Self-pay | Admitting: Oncology

## 2022-03-17 NOTE — Telephone Encounter (Signed)
Contacted pt to schedule Venofer appts. Unable to lvm due to maildbox being full.

## 2022-03-22 ENCOUNTER — Encounter: Payer: Self-pay | Admitting: Oncology

## 2022-03-22 MED FILL — Iron Sucrose Inj 20 MG/ML (Fe Equiv): INTRAVENOUS | Qty: 10 | Status: AC

## 2022-03-23 ENCOUNTER — Inpatient Hospital Stay: Payer: Medicare PPO

## 2022-03-23 VITALS — BP 158/60 | HR 85 | Temp 98.1°F | Resp 18 | Ht 73.0 in

## 2022-03-23 DIAGNOSIS — D509 Iron deficiency anemia, unspecified: Secondary | ICD-10-CM | POA: Diagnosis present

## 2022-03-23 DIAGNOSIS — E538 Deficiency of other specified B group vitamins: Secondary | ICD-10-CM | POA: Diagnosis present

## 2022-03-23 DIAGNOSIS — D500 Iron deficiency anemia secondary to blood loss (chronic): Secondary | ICD-10-CM

## 2022-03-23 MED ORDER — SODIUM CHLORIDE 0.9 % IV SOLN: Freq: Once | INTRAVENOUS | Status: AC

## 2022-03-23 MED ORDER — CYANOCOBALAMIN 1000 MCG/ML IJ SOLN
1000.0000 ug | Freq: Once | INTRAMUSCULAR | Status: AC
  Administered 2022-03-23: 1000 ug via INTRAMUSCULAR
  Filled 2022-03-23: qty 1

## 2022-03-23 MED ORDER — SODIUM CHLORIDE 0.9 % IV SOLN
200.0000 mg | Freq: Once | INTRAVENOUS | Status: AC
  Administered 2022-03-23: 200 mg via INTRAVENOUS
  Filled 2022-03-23: qty 200

## 2022-03-23 NOTE — Patient Instructions (Signed)
VitaminIron Sucrose Injection What is this medication? IRON SUCROSE (EYE ern SOO krose) treats low levels of iron (iron deficiency anemia) in people with kidney disease. Iron is a mineral that plays an important role in making red blood cells, which carry oxygen from your lungs to the rest of your body. This medicine may be used for other purposes; ask your health care provider or pharmacist if you have questions. COMMON BRAND NAME(S): Venofer What should I tell my care team before I take this medication? They need to know if you have any of these conditions: Anemia not caused by low iron levels Heart disease High levels of iron in the blood Kidney disease Liver disease An unusual or allergic reaction to iron, other medications, foods, dyes, or preservatives Pregnant or trying to get pregnant Breast-feeding How should I use this medication? This medication is for infusion into a vein. It is given in a hospital or clinic setting. Talk to your care team about the use of this medication in children. While this medication may be prescribed for children as young as 2 years for selected conditions, precautions do apply. Overdosage: If you think you have taken too much of this medicine contact a poison control center or emergency room at once. NOTE: This medicine is only for you. Do not share this medicine with others. What if I miss a dose? It is important not to miss your dose. Call your care team if you are unable to keep an appointment. What may interact with this medication? Do not take this medication with any of the following: Deferoxamine Dimercaprol Other iron products This medication may also interact with the following: Chloramphenicol Deferasirox This list may not describe all possible interactions. Give your health care provider a list of all the medicines, herbs, non-prescription drugs, or dietary supplements you use. Also tell them if you smoke, drink alcohol, or use illegal  drugs. Some items may interact with your medicine. What should I watch for while using this medication? Visit your care team regularly. Tell your care team if your symptoms do not start to get better or if they get worse. You may need blood work done while you are taking this medication. You may need to follow a special diet. Talk to your care team. Foods that contain iron include: whole grains/cereals, dried fruits, beans, or peas, leafy green vegetables, and organ meats (liver, kidney). What side effects may I notice from receiving this medication? Side effects that you should report to your care team as soon as possible: Allergic reactions--skin rash, itching, hives, swelling of the face, lips, tongue, or throat Low blood pressure--dizziness, feeling faint or lightheaded, blurry vision Shortness of breath Side effects that usually do not require medical attention (report to your care team if they continue or are bothersome): Flushing Headache Joint pain Muscle pain Nausea Pain, redness, or irritation at injection site This list may not describe all possible side effects. Call your doctor for medical advice about side effects. You may report side effects to FDA at 1-800-FDA-1088. Where should I keep my medication? This medication is given in a hospital or clinic and will not be stored at home. NOTE: This sheet is a summary. It may not cover all possible information. If you have questions about this medicine, talk to your doctor, pharmacist, or health care provider.  2023 Elsevier/Gold Standard (2021-03-12 00:00:00)  B12 Deficiency Vitamin B12 deficiency means that your body does not have enough vitamin B12. The body needs this important vitamin: To  make red blood cells. To make genes (DNA). To help the nerves work. If you do not have enough vitamin B12 in your body, you can have health problems, such as not having enough red blood cells in the blood (anemia). What are the causes? Not  eating enough foods that contain vitamin B12. Not being able to take in (absorb) vitamin B12 from the food that you eat. Certain diseases. A condition in which the body does not make enough of a certain protein. This results in your body not taking in enough vitamin B12. Having a surgery in which part of the stomach or small intestine is taken out. Taking medicines that make it hard for the body to take in vitamin B12. These include: Heartburn medicines. Some medicines that are used to treat diabetes. What increases the risk? Being an older adult. Eating a vegetarian or vegan diet that does not include any foods that come from animals. Not eating enough foods that contain vitamin B12 while you are pregnant. Taking certain medicines. Having alcoholism. What are the signs or symptoms? In some cases, there are no symptoms. If the condition leads to too few blood cells or nerve damage, symptoms can occur, such as: Feeling weak or tired. Not being hungry. Losing feeling (numbness) or tingling in your hands and feet. Redness and burning of the tongue. Feeling sad (depressed). Confusion or memory problems. Trouble walking. If anemia is very bad, symptoms can include: Being short of breath. Being dizzy. Having a very fast heartbeat. How is this treated? Changing the way you eat and drink, such as: Eating more foods that contain vitamin B12. Drinking little or no alcohol. Getting vitamin B12 shots. Taking vitamin B12 supplements by mouth (orally). Your doctor will tell you the dose that is best for you. Follow these instructions at home: Eating and drinking  Eat foods that come from animals and have a lot of vitamin B12 in them. These include: Meats and poultry. This includes beef, pork, chicken, [place], and organ meats, such as liver. Seafood, such as clams, rainbow trout, salmon, tuna, and haddock. Eggs. Dairy foods such as milk, yogurt, and cheese. Eat breakfast cereals that have  vitamin B12 added to them (are fortified). Check the label. The items listed above may not be a complete list of foods and beverages you can eat and drink. Contact a dietitian for more information. Alcohol use Do not drink alcohol if: Your doctor tells you not to drink. You are pregnant, may be pregnant, or are planning to become pregnant. If you drink alcohol: Limit how much you have to: 0-1 drink a day for women. 0-2 drinks a day for men. Know how much alcohol is in your drink. In the [place] one drink equals one 12 oz bottle of beer (355 mL), one 5 oz glass of wine (148 mL), or one 1 oz glass of hard liquor (44 mL). General instructions Get any vitamin B12 shots if told by your doctor. Take supplements only as told by your doctor. Follow the directions. Keep all follow-up visits. Contact a doctor if: Your symptoms come back. Your symptoms get worse or do not get better with treatment. Get help right away if: You have trouble breathing. You have a very fast heartbeat. You have chest pain. You get dizzy. You faint. These symptoms may be an emergency. Get help right away. Call 911. Do not wait to see if the symptoms will go away. Do not drive yourself to the hospital. Summary Vitamin B12 deficiency  means that your body is not getting enough of the vitamin. In some cases, there are no symptoms of this condition. Treatment may include making a change in the way you eat and drink, getting shots, or taking supplements. Eat foods that have vitamin B12 in them. This information is not intended to replace advice given to you by your health care provider. Make sure you discuss any questions you have with your health care provider. Document Revised: 06/11/2021 Document Reviewed: 06/11/2021 Elsevier Patient Education  [place].

## 2022-03-24 ENCOUNTER — Encounter: Payer: Self-pay | Admitting: Oncology

## 2022-03-24 ENCOUNTER — Inpatient Hospital Stay: Payer: Medicare PPO

## 2022-03-24 VITALS — BP 190/110 | HR 80 | Temp 98.7°F | Resp 18

## 2022-03-24 DIAGNOSIS — D500 Iron deficiency anemia secondary to blood loss (chronic): Secondary | ICD-10-CM

## 2022-03-24 DIAGNOSIS — D509 Iron deficiency anemia, unspecified: Secondary | ICD-10-CM | POA: Diagnosis not present

## 2022-03-24 MED ORDER — SODIUM CHLORIDE 0.9 % IV SOLN: Freq: Once | INTRAVENOUS | Status: AC

## 2022-03-24 MED ORDER — SODIUM CHLORIDE 0.9 % IV SOLN
200.0000 mg | Freq: Once | INTRAVENOUS | Status: AC
  Administered 2022-03-24: 200 mg via INTRAVENOUS
  Filled 2022-03-24: qty 200

## 2022-03-24 MED ORDER — CLONIDINE HCL 0.1 MG PO TABS
0.1000 mg | ORAL_TABLET | Freq: Once | ORAL | Status: AC
  Administered 2022-03-24: 0.1 mg via ORAL
  Filled 2022-03-24: qty 1

## 2022-03-24 MED ORDER — CYANOCOBALAMIN 1000 MCG/ML IJ SOLN
1000.0000 ug | Freq: Once | INTRAMUSCULAR | Status: AC
  Administered 2022-03-24: 1000 ug via INTRAMUSCULAR
  Filled 2022-03-24: qty 1

## 2022-03-24 NOTE — Progress Notes (Signed)
Dayton Scrape NP and Ulice Dash Pharm D aware of post venofer BP- Patient states he missed his mid day dose of medications- Catapress given. Family aware that patient will wait 30 min for BP recheck

## 2022-03-24 NOTE — Patient Instructions (Signed)

## 2022-03-25 ENCOUNTER — Encounter: Payer: Self-pay | Admitting: Oncology

## 2022-03-29 ENCOUNTER — Inpatient Hospital Stay: Payer: Medicare PPO

## 2022-03-29 VITALS — BP 177/101 | HR 77 | Resp 18

## 2022-03-29 DIAGNOSIS — D509 Iron deficiency anemia, unspecified: Secondary | ICD-10-CM | POA: Diagnosis not present

## 2022-03-29 DIAGNOSIS — D500 Iron deficiency anemia secondary to blood loss (chronic): Secondary | ICD-10-CM

## 2022-03-29 MED ORDER — CYANOCOBALAMIN 1000 MCG/ML IJ SOLN
1000.0000 ug | Freq: Once | INTRAMUSCULAR | Status: AC
  Administered 2022-03-29: 1000 ug via INTRAMUSCULAR
  Filled 2022-03-29: qty 1

## 2022-03-29 NOTE — Patient Instructions (Signed)
Vitamin B12 Injection What is this medication? Vitamin B12 (VAHY tuh min B12) prevents and treats low vitamin B12 levels in your body. It is used in people who do not get enough vitamin B12 from their diet or when their digestive tract does not absorb enough. Vitamin B12 plays an important role in maintaining the health of your nervous system and red blood cells. This medicine may be used for other purposes; ask your health care provider or pharmacist if you have questions. COMMON BRAND NAME(S): B-12 Compliance Kit, B-12 Injection Kit, Cyomin, Dodex, LA-12, Nutri-Twelve, Physicians EZ Use B-12, Primabalt What should I tell my care team before I take this medication? They need to know if you have any of these conditions: Kidney disease Leber's disease Megaloblastic anemia An unusual or allergic reaction to cyanocobalamin, cobalt, other medications, foods, dyes, or preservatives Pregnant or trying to get pregnant Breast-feeding How should I use this medication? This medication is injected into a muscle or deeply under the skin. It is usually given in a clinic or care team's office. However, your care team may teach you how to inject yourself. Follow all instructions. Talk to your care team about the use of this medication in children. Special care may be needed. Overdosage: If you think you have taken too much of this medicine contact a poison control center or emergency room at once. NOTE: This medicine is only for you. Do not share this medicine with others. What if I miss a dose? If you are given your dose at a clinic or care team's office, call to reschedule your appointment. If you give your own injections, and you miss a dose, take it as soon as you can. If it is almost time for your next dose, take only that dose. Do not take double or extra doses. What may interact with this medication? Alcohol Colchicine This list may not describe all possible interactions. Give your health care  provider a list of all the medicines, herbs, non-prescription drugs, or dietary supplements you use. Also tell them if you smoke, drink alcohol, or use illegal drugs. Some items may interact with your medicine. What should I watch for while using this medication? Visit your care team regularly. You may need blood work done while you are taking this medication. You may need to follow a special diet. Talk to your care team. Limit your alcohol intake and avoid smoking to get the best benefit. What side effects may I notice from receiving this medication? Side effects that you should report to your care team as soon as possible: Allergic reactions--skin rash, itching, hives, swelling of the face, lips, tongue, or throat Swelling of the ankles, hands, or feet Trouble breathing Side effects that usually do not require medical attention (report to your care team if they continue or are bothersome): Diarrhea This list may not describe all possible side effects. Call your doctor for medical advice about side effects. You may report side effects to FDA at 1-800-FDA-1088. Where should I keep my medication? Keep out of the reach of children. Store at room temperature between 15 and 30 degrees C (59 and 85 degrees F). Protect from light. Throw away any unused medication after the expiration date. NOTE: This sheet is a summary. It may not cover all possible information. If you have questions about this medicine, talk to your doctor, pharmacist, or health care provider.  2023 Elsevier/Gold Standard (2021-06-29 00:00:00)

## 2022-03-30 ENCOUNTER — Ambulatory Visit: Payer: Medicare PPO

## 2022-03-30 MED FILL — Iron Sucrose Inj 20 MG/ML (Fe Equiv): INTRAVENOUS | Qty: 10 | Status: AC

## 2022-03-31 ENCOUNTER — Inpatient Hospital Stay: Payer: Medicare PPO | Attending: Oncology

## 2022-03-31 ENCOUNTER — Other Ambulatory Visit: Payer: Self-pay

## 2022-03-31 VITALS — BP 182/96 | HR 73 | Temp 98.2°F | Resp 18

## 2022-03-31 DIAGNOSIS — I110 Hypertensive heart disease with heart failure: Secondary | ICD-10-CM | POA: Insufficient documentation

## 2022-03-31 DIAGNOSIS — R5381 Other malaise: Secondary | ICD-10-CM

## 2022-03-31 DIAGNOSIS — R0602 Shortness of breath: Secondary | ICD-10-CM | POA: Insufficient documentation

## 2022-03-31 DIAGNOSIS — I5031 Acute diastolic (congestive) heart failure: Secondary | ICD-10-CM

## 2022-03-31 DIAGNOSIS — D500 Iron deficiency anemia secondary to blood loss (chronic): Secondary | ICD-10-CM

## 2022-03-31 DIAGNOSIS — R5383 Other fatigue: Secondary | ICD-10-CM

## 2022-03-31 DIAGNOSIS — D509 Iron deficiency anemia, unspecified: Secondary | ICD-10-CM | POA: Insufficient documentation

## 2022-03-31 DIAGNOSIS — I5032 Chronic diastolic (congestive) heart failure: Secondary | ICD-10-CM | POA: Insufficient documentation

## 2022-03-31 DIAGNOSIS — E538 Deficiency of other specified B group vitamins: Secondary | ICD-10-CM | POA: Insufficient documentation

## 2022-03-31 DIAGNOSIS — I160 Hypertensive urgency: Secondary | ICD-10-CM | POA: Insufficient documentation

## 2022-03-31 DIAGNOSIS — J811 Chronic pulmonary edema: Secondary | ICD-10-CM | POA: Insufficient documentation

## 2022-03-31 DIAGNOSIS — E7800 Pure hypercholesterolemia, unspecified: Secondary | ICD-10-CM

## 2022-03-31 DIAGNOSIS — I509 Heart failure, unspecified: Secondary | ICD-10-CM

## 2022-03-31 DIAGNOSIS — I10 Essential (primary) hypertension: Secondary | ICD-10-CM

## 2022-03-31 DIAGNOSIS — C4490 Unspecified malignant neoplasm of skin, unspecified: Secondary | ICD-10-CM | POA: Insufficient documentation

## 2022-03-31 HISTORY — DX: Acute diastolic (congestive) heart failure: I50.31

## 2022-03-31 HISTORY — DX: Hypertensive urgency: I16.0

## 2022-03-31 HISTORY — DX: Essential (primary) hypertension: I10

## 2022-03-31 HISTORY — DX: Other malaise: R53.81

## 2022-03-31 HISTORY — DX: Heart failure, unspecified: I50.9

## 2022-03-31 HISTORY — DX: Chronic diastolic (congestive) heart failure: I50.32

## 2022-03-31 HISTORY — DX: Other fatigue: R53.83

## 2022-03-31 HISTORY — DX: Chronic pulmonary edema: J81.1

## 2022-03-31 HISTORY — DX: Shortness of breath: R06.02

## 2022-03-31 HISTORY — DX: Pure hypercholesterolemia, unspecified: E78.00

## 2022-03-31 HISTORY — DX: Hypertensive heart disease with heart failure: I11.0

## 2022-03-31 MED ORDER — SODIUM CHLORIDE 0.9 % IV SOLN: Freq: Once | INTRAVENOUS | Status: AC

## 2022-03-31 MED ORDER — CYANOCOBALAMIN 1000 MCG/ML IJ SOLN
1000.0000 ug | Freq: Once | INTRAMUSCULAR | Status: AC
  Administered 2022-03-31: 1000 ug via INTRAMUSCULAR
  Filled 2022-03-31: qty 1

## 2022-03-31 MED ORDER — SODIUM CHLORIDE 0.9 % IV SOLN
200.0000 mg | Freq: Once | INTRAVENOUS | Status: AC
  Administered 2022-03-31: 200 mg via INTRAVENOUS
  Filled 2022-03-31: qty 200

## 2022-03-31 NOTE — Progress Notes (Unsigned)
Cardiology Office Note:    Date:  04/01/2022   ID:  Anthony Mueller, DOB [birth year], MRN [id number]  PCP:  Maris Berger, MD  Cardiologist:  Shirlee More, MD    Referring MD: Maris Berger, MD    ASSESSMENT:    1. Persistent atrial fibrillation ([place])   2. Chronic anticoagulation   3. VT (ventricular tachycardia) ([place])   4. Hypertensive heart disease with chronic diastolic congestive heart failure ([place])   5. Iron deficiency anemia due to chronic blood loss    PLAN:    In order of problems listed above:  He is improved rate is controlled he will continue his beta-blocker and is no longer anticoagulated because of GI bleeding iron deficiency anemia.  Next visit if he is stable we could discuss the potential role of Watchman device No recurrent VT on his most recent monitor especially after repleting magnesium Stable continue his current antihypertensives including clonidine carvedilol furosemide hydralazine isosorbide nitrate and spironolactone.  He will take an extra half dose of clonidine before going to the hematology office. Stable being managed by hematology with iron infusion   Next appointment: 4 months   Medication Adjustments/Labs and Tests Ordered: Current medicines are reviewed at length with the patient today.  Concerns regarding medicines are outlined above.  No orders of the defined types were placed in this encounter.  No orders of the defined types were placed in this encounter.   Chief Complaint  Patient presents with   Follow-up   Atrial Fibrillation    History of Present Illness:    Anthony Mueller is a 71 y.o. 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  Because of occult GI bleeding anticoagulation was stopped.  He was recently admitted to [facility] 11/10/2021 11/16/2021 with acute respiratory failure following COVID-19 infection.  He was hypoxic was treated with steroids and received IV Lasix but he does not have a diagnosis of heart failure.  He has noted to be in atrial fibrillation and was anticoagulated and continued on a beta-blocker.   He was seen by heart care in hospital 11/16/2021 for his atrial fibrillation with recommendations to use an outpatient ambulatory heart rhythm monitor with mild pauses in hospital and to continue anticoagulation.   His event monitor was reported 12/10/2021 with continuous atrial fibrillation throughout 14 episodes of a pauses the longest 3.5 seconds while the ventricular ectopy was occasional he had 11 episodes of ventricular tachycardia the longest 15 complexes at a slow rate of 103 bpm   CTA of the chest 11/10/2021 showed cardiomegaly aortic atherosclerosis.   An echocardiogram performed 11/11/2021 at [facility] left ventricle is normal in size mild concentric LVH ejection fraction 55 to 60%.  The right ventricle normal in size and function with mildly elevated pulmonary artery systolic pressure 40 mmHg left atrium is moderately dilated right atrium mildly dilated there is trivial aortic and mild mitral regurgitation.   1. Left ventricular ejection fraction, by estimation, is 55 to 60%. The  left ventricle has normal function. The left ventricle has no regional  wall motion abnormalities. There is mild left ventricular hypertrophy.  Left ventricular diastolic parameters  were normal.   2. Right ventricular systolic function is normal.  The right ventricular  size is normal. There is mildly elevated pulmonary artery systolic  pressure. The estimated right ventricular systolic pressure is 25.6 mmHg.   3. Left atrial size was moderately dilated.   4. Right  atrial size was mildly dilated.   5. The mitral valve is normal in structure. Mild mitral valve  regurgitation. No evidence of mitral stenosis.   6. The aortic valve is tricuspid. Aortic valve regurgitation is trivial.  Aortic valve sclerosis/calcification is present, without any evidence of  aortic stenosis.   7. The inferior vena cava is dilated in size with <50% respiratory  variability, suggesting right atrial pressure of 15 mmHg.   Compliance with diet, lifestyle and medications: Yes  He is improved and is pending a suprapubic tube for his neurogenic bladder. He sees the hematologist for his iron deficiency and tells me his blood pressure is very elevated much of it is the stress of trying to get out of an automobile and get inside and have him come 15 to 10 minutes early so he can pause and rest and his wife will give him an extra 0.1 mg of clonidine before leaving to go for the visit Weight is down in the range of 20 pounds no edema shortness of breath chest pain palpitation or syncope Most recent labs creatinine 1.48 GFR 50 cc potassium 4.1 hemoglobin 9.5  3 weeks ago 03/09/2022  He used a 7-day event monitor reported 0.325 2023 showing atrial fibrillation with good heart rate control and no pauses of 3 seconds or greater and no symptomatic events. Past Medical History:  Diagnosis Date   Accelerated hypertension 01/06/9372   Acute diastolic (congestive) heart failure ([place]) 03/31/2022   Acute on chronic anemia 01/13/2022   AKI (acute kidney injury) ([place]) 01/13/2022   At risk for fall due to comorbid condition 01/20/2022   B12 deficiency 03/01/2022   Bilateral lower extremity edema 01/13/2022   Calculus of gallbladder with acute cholecystitis without obstruction 02/28/2022   Cardiomegaly 11/10/2021   Cerebellar degeneration ([place])    CHF exacerbation ([place]) 03/31/2022   Cholelithiasis 11/10/2021   Chronic diastolic heart failure ([place]) 03/31/2022   Diabetes mellitus without complication ([place])    DM  type 2 with diabetic mixed hyperlipidemia ([place]) 03/04/2020   Does not transfer from wheelchair to toilet 01/20/2022   Drug therapy 02/02/2016   Elevated antinuclear antibody (ANA) level 10/06/2015   Gait disorder 09/07/2016   GERD (gastroesophageal reflux disease)    Gout 08/28/2012   History of 2019 novel coronavirus disease (COVID-19) 10/27/2021   History of colonic polyps 11/14/2011   History of esophageal stricture 05/02/2018   Hypercholesteremia 03/31/2022   Hypercoagulable state due to persistent atrial fibrillation ([place]) 12/16/2021   Hyperlipidemia    Hypertension    Hypertension, essential, benign 08/28/2012   Hypertensive heart disease 01/05/2022   Hypertensive heart disease with acute on chronic diastolic congestive heart failure ([place]) 03/31/2022   Hypertensive urgency 03/31/2022   Hypokalemia 11/10/2021   Hypomagnesemia 01/13/2022   Intertrigo 09/03/2018   Iron deficiency anemia due to chronic blood loss 03/01/2022   Long term (current) use of anticoagulants 12/16/2021   Malaise and fatigue 03/31/2022   Muscular deconditioning 12/23/2021   Occult GI bleeding 11/06/2017   OSA (obstructive sleep apnea) 01/13/2022   Formatting of this note might be different from the original. WEARS CPAP   Persistent atrial fibrillation ([place]) 11/13/2021   Formatting of this note might be different from the original. Last Assessment & Plan:  Formatting  of this note might be different from the original. - Patient noted to be in atrial fibrillation, bradycardia with sinus pauses -Per cardiology, started on apixaban, will need to be stopped prior to his upcoming rectal surgery (stop 5 days prior to the surgery and then resume once cleared by surgery se   Positive ANA (antinuclear antibody) 10/06/2015   Postoperative anemia due to acute blood loss 01/20/2022   Postoperative examination 02/28/2022   Pulmonary edema 03/31/2022   Rectal bleeding 01/14/2022   RLS (restless legs syndrome) 09/08/2016   Shortness of breath 03/31/2022   Skin  cancer    Transaminitis 11/10/2021   Trigger middle finger of left hand 10/07/2015   Type 2 diabetes mellitus with hyperlipidemia ([place]) 11/10/2021   Unspecified atrial fibrillation (H[facility]) with sinus pauses 11/13/2021    Past Surgical History:  Procedure Laterality Date   APPENDECTOMY     CATARACT EXTRACTION, BILATERAL     CHOLECYSTECTOMY     HEMORRHOID SURGERY     POLYPECTOMY     ULNAR NERVE REPAIR Left     Current Medications: Current Meds  Medication Sig   allopurinol (ZYLOPRIM) 100 MG tablet Take 100 mg by mouth in the morning.   ascorbic acid (VITAMIN C) 500 MG tablet Take 1 tablet (500 mg total) by mouth daily.   aspirin EC 81 MG tablet Take 81 mg by mouth daily.   carvedilol (COREG) 25 MG tablet Take 25 mg by mouth 2 (two) times daily.   cloNIDine (CATAPRES) 0.2 MG tablet Take 0.2 mg by mouth 2 (two) times daily.   finasteride (PROSCAR) 5 MG tablet Take 5 mg by mouth daily.   furosemide (LASIX) 40 MG tablet Take 40 mg by mouth every Monday, Wednesday, and Friday.   hydrALAZINE (APRESOLINE) 25 MG tablet Take 37.5 mg by mouth in the morning, at noon, and at bedtime.   isosorbide dinitrate (ISORDIL) 10 MG tablet Take 10 mg by mouth 3 (three) times daily.   KLOR-CON M15 15 MEQ tablet Take 15 mEq by mouth every Monday, Wednesday, and Friday.   lovastatin (MEVACOR) 40 MG tablet Take 40 mg by mouth every evening.   Magnesium Glycinate 665 MG CAPS Take 665 mg by mouth daily.   metFORMIN (GLUCOPHAGE) 500 MG tablet Take 1,000 mg by mouth 2 (two) times daily.   oxaprozin (DAYPRO) 600 MG tablet Take 1,200 mg by mouth daily as needed (Gout).   spironolactone (ALDACTONE) 25 MG tablet Take 25 mg by mouth every Monday, Wednesday, and Friday.   tamsulosin (FLOMAX) 0.4 MG CAPS capsule Take 0.4 mg by mouth daily.   timolol (TIMOPTIC) 0.5 % ophthalmic solution Place 1 drop into both eyes daily.   TRADJENTA 5 MG TABS tablet Take 5 mg by mouth daily.   zinc sulfate 220 (50 Zn) MG capsule Take 1  capsule (220 mg total) by mouth daily.   [DISCONTINUED] Ferrous Sulfate (IRON SLOW RELEASE) 143 (45 Fe) MG TBCR Take 143 mg by mouth every evening.   [DISCONTINUED] omeprazole (PRILOSEC) 20 MG capsule Take 40 mg by mouth daily.     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  Tobacco Use   Smoking status: Former  Types: Cigarettes    Passive exposure: Never   Smokeless tobacco: Never  Vaping Use   Vaping Use: Never used  Substance and Sexual Activity   Alcohol use: Yes    Comment: RARELY   Drug use: Never   Sexual activity: Not Currently  Other Topics Concern   Not on file  Social History Narrative   Not on file   Social Determinants of Health   Financial Resource Strain: Not on file  Food Insecurity: Not on file  Transportation Needs: Not on file  Physical Activity: Not on file  Stress: Not on file  Social Connections: Not on file     Family History: The patient's family history includes Colon cancer in his father; Diabetes in his mother; Healthy in his sister; Hyperlipidemia in his mother; Hypertension in his mother; Stroke in his mother. ROS:   Please see the history of present illness.    All other systems reviewed and are negative.  EKGs/Labs/Other Studies Reviewed:    The following studies were reviewed today:  Recent Labs: 11/15/2021: ALT 108 01/12/2022: NT-Pro BNP 1,979 01/26/2022: B Natriuretic Peptide 353.7 01/27/2022: BUN 19; Creatinine, Ser 1.39; Magnesium 1.9; Potassium 3.4; Sodium 141 03/01/2022: Hemoglobin 10.1; Platelets 205  Recent Lipid Panel No results found for: CHOL, TRIG, HDL, CHOLHDL, VLDL, LDLCALC, LDLDIRECT  Physical  Exam:    VS:  BP (!) 162/86   Pulse 80   Ht '6\' 1"'$  (1.854 m)   Wt 228 lb (103.4 kg)   SpO2 97%   BMI 30.08 kg/m     Wt Readings from Last 3 Encounters:  04/01/22 228 lb (103.4 kg)  04/01/22 227 lb 12.8 oz (103.3 kg)  01/27/22 240 lb (108.9 kg)     GEN: Chronically ill debilitated he is in a wheelchair work  well nourished, well developed in no acute distress HEENT: Normal NECK: No JVD; No carotid bruits LYMPHATICS: No lymphadenopathy CARDIAC irregular rate and rhythm no murmurs, rubs, gallops RESPIRATORY:  Clear to auscultation without rales, wheezing or rhonchi  ABDOMEN: Soft, non-tender, non-distended MUSCULOSKELETAL:  No edema; No deformity  SKIN: Warm and dry NEUROLOGIC:  Alert and oriented x 3 PSYCHIATRIC:  Normal affect    Signed, Shirlee More, MD  04/01/2022 2:39 PM    [place]

## 2022-03-31 NOTE — Patient Instructions (Signed)

## 2022-04-01 ENCOUNTER — Encounter: Payer: Self-pay | Admitting: Cardiology

## 2022-04-01 ENCOUNTER — Ambulatory Visit: Payer: Medicare PPO | Admitting: Cardiology

## 2022-04-01 ENCOUNTER — Inpatient Hospital Stay: Payer: Medicare PPO

## 2022-04-01 ENCOUNTER — Encounter: Payer: Self-pay | Admitting: Oncology

## 2022-04-01 VITALS — BP 162/86 | HR 80 | Ht 73.0 in | Wt 228.0 lb

## 2022-04-01 VITALS — BP 171/82 | HR 80 | Temp 98.3°F | Resp 18 | Ht 73.0 in | Wt 227.8 lb

## 2022-04-01 DIAGNOSIS — Z7901 Long term (current) use of anticoagulants: Secondary | ICD-10-CM

## 2022-04-01 DIAGNOSIS — I110 Hypertensive heart disease with heart failure: Secondary | ICD-10-CM

## 2022-04-01 DIAGNOSIS — I4819 Other persistent atrial fibrillation: Secondary | ICD-10-CM | POA: Diagnosis not present

## 2022-04-01 DIAGNOSIS — D500 Iron deficiency anemia secondary to blood loss (chronic): Secondary | ICD-10-CM

## 2022-04-01 DIAGNOSIS — D509 Iron deficiency anemia, unspecified: Secondary | ICD-10-CM | POA: Diagnosis not present

## 2022-04-01 DIAGNOSIS — I472 Ventricular tachycardia, unspecified: Secondary | ICD-10-CM

## 2022-04-01 DIAGNOSIS — I5032 Chronic diastolic (congestive) heart failure: Secondary | ICD-10-CM

## 2022-04-01 MED ORDER — CYANOCOBALAMIN 1000 MCG/ML IJ SOLN
1000.0000 ug | Freq: Once | INTRAMUSCULAR | Status: AC
  Administered 2022-04-01: 1000 ug via INTRAMUSCULAR
  Filled 2022-04-01: qty 1

## 2022-04-01 NOTE — Patient Instructions (Signed)
Vitamin B12 Deficiency Vitamin B12 deficiency means that your body does not have enough vitamin B12. The body needs this important vitamin: To make red blood cells. To make genes (DNA). To help the nerves work. If you do not have enough vitamin B12 in your body, you can have health problems, such as not having enough red blood cells in the blood (anemia). What are the causes? Not eating enough foods that contain vitamin B12. Not being able to take in (absorb) vitamin B12 from the food that you eat. Certain diseases. A condition in which the body does not make enough of a certain protein. This results in your body not taking in enough vitamin B12. Having a surgery in which part of the stomach or small intestine is taken out. Taking medicines that make it hard for the body to take in vitamin B12. These include: Heartburn medicines. Some medicines that are used to treat diabetes. What increases the risk? Being an older adult. Eating a vegetarian or vegan diet that does not include any foods that come from animals. Not eating enough foods that contain vitamin B12 while you are pregnant. Taking certain medicines. Having alcoholism. What are the signs or symptoms? In some cases, there are no symptoms. If the condition leads to too few blood cells or nerve damage, symptoms can occur, such as: Feeling weak or tired. Not being hungry. Losing feeling (numbness) or tingling in your hands and feet. Redness and burning of the tongue. Feeling sad (depressed). Confusion or memory problems. Trouble walking. If anemia is very bad, symptoms can include: Being short of breath. Being dizzy. Having a very fast heartbeat. How is this treated? Changing the way you eat and drink, such as: Eating more foods that contain vitamin B12. Drinking little or no alcohol. Getting vitamin B12 shots. Taking vitamin B12 supplements by mouth (orally). Your doctor will tell you the dose that is best for you. Follow  these instructions at home: Eating and drinking  Eat foods that come from animals and have a lot of vitamin B12 in them. These include: Meats and poultry. This includes beef, pork, chicken, turkey, and organ meats, such as liver. Seafood, such as clams, rainbow trout, salmon, tuna, and haddock. Eggs. Dairy foods such as milk, yogurt, and cheese. Eat breakfast cereals that have vitamin B12 added to them (are fortified). Check the label. The items listed above may not be a complete list of foods and beverages you can eat and drink. Contact a dietitian for more information. Alcohol use Do not drink alcohol if: Your doctor tells you not to drink. You are pregnant, may be pregnant, or are planning to become pregnant. If you drink alcohol: Limit how much you have to: 0-1 drink a day for women. 0-2 drinks a day for men. Know how much alcohol is in your drink. In the [place] one drink equals one 12 oz bottle of beer (355 mL), one 5 oz glass of wine (148 mL), or one 1 oz glass of hard liquor (44 mL). General instructions Get any vitamin B12 shots if told by your doctor. Take supplements only as told by your doctor. Follow the directions. Keep all follow-up visits. Contact a doctor if: Your symptoms come back. Your symptoms get worse or do not get better with treatment. Get help right away if: You have trouble breathing. You have a very fast heartbeat. You have chest pain. You get dizzy. You faint. These symptoms may be an emergency. Get help right away. Call 911.   Do not wait to see if the symptoms will go away. Do not drive yourself to the hospital. Summary Vitamin B12 deficiency means that your body is not getting enough of the vitamin. In some cases, there are no symptoms of this condition. Treatment may include making a change in the way you eat and drink, getting shots, or taking supplements. Eat foods that have vitamin B12 in them. This information is not intended to replace advice  given to you by your health care provider. Make sure you discuss any questions you have with your health care provider. Document Revised: 06/11/2021 Document Reviewed: 06/11/2021 Elsevier Patient Education  2023 Elsevier Inc.  

## 2022-04-01 NOTE — Patient Instructions (Signed)
Medication Instructions:  Your physician recommends that you continue on your current medications as directed. Please refer to the Current Medication list given to you today.  *If you need a refill on your cardiac medications before your next appointment, please call your pharmacy*   Lab Work: None If you have labs (blood work) drawn today and your tests are completely normal, you will receive your results only by: [place] (if you have MyChart) OR A paper copy in the mail If you have any lab test that is abnormal or we need to change your treatment, we will call you to review the results.   Testing/Procedures: None   Follow-Up: At [facility], you and your health needs are our priority.  As part of our continuing mission to provide you with exceptional heart care, we have created designated Provider Care Teams.  These Care Teams include your primary Cardiologist (physician) and Advanced Practice Providers (APPs -  Physician Assistants and Nurse Practitioners) who all work together to provide you with the care you need, when you need it.  We recommend signing up for the patient portal called "MyChart".  Sign up information is provided on this After Visit Summary.  MyChart is used to connect with patients for Virtual Visits (Telemedicine).  Patients are able to view lab/test results, encounter notes, upcoming appointments, etc.  Non-urgent messages can be sent to your provider as well.   To learn more about what you can do with MyChart, go to [email or web address].    Your next appointment:   4 month(s)  The format for your next appointment:   In Person  Provider:   Shirlee More, MD    Other Instructions Day of the cancer center appointment take an extra 1/2 = 0.1 mg Clonidine at 11 am.  Important Information About Sugar

## 2022-04-04 ENCOUNTER — Other Ambulatory Visit: Payer: Self-pay | Admitting: Pharmacist

## 2022-04-04 ENCOUNTER — Inpatient Hospital Stay: Payer: Medicare PPO

## 2022-04-04 VITALS — BP 149/86 | HR 87 | Temp 98.0°F | Resp 18 | Ht 73.0 in | Wt 227.8 lb

## 2022-04-04 DIAGNOSIS — D500 Iron deficiency anemia secondary to blood loss (chronic): Secondary | ICD-10-CM

## 2022-04-04 DIAGNOSIS — D509 Iron deficiency anemia, unspecified: Secondary | ICD-10-CM | POA: Diagnosis not present

## 2022-04-04 MED ORDER — CYANOCOBALAMIN 1000 MCG/ML IJ SOLN
1000.0000 ug | Freq: Once | INTRAMUSCULAR | Status: AC
  Administered 2022-04-04: 1000 ug via INTRAMUSCULAR
  Filled 2022-04-04: qty 1

## 2022-04-04 MED FILL — Iron Sucrose Inj 20 MG/ML (Fe Equiv): INTRAVENOUS | Qty: 10 | Status: AC

## 2022-04-04 NOTE — Patient Instructions (Signed)
Vitamin B12 Deficiency Vitamin B12 deficiency means that your body does not have enough vitamin B12. The body needs this important vitamin: To make red blood cells. To make genes (DNA). To help the nerves work. If you do not have enough vitamin B12 in your body, you can have health problems, such as not having enough red blood cells in the blood (anemia). What are the causes? Not eating enough foods that contain vitamin B12. Not being able to take in (absorb) vitamin B12 from the food that you eat. Certain diseases. A condition in which the body does not make enough of a certain protein. This results in your body not taking in enough vitamin B12. Having a surgery in which part of the stomach or small intestine is taken out. Taking medicines that make it hard for the body to take in vitamin B12. These include: Heartburn medicines. Some medicines that are used to treat diabetes. What increases the risk? Being an older adult. Eating a vegetarian or vegan diet that does not include any foods that come from animals. Not eating enough foods that contain vitamin B12 while you are pregnant. Taking certain medicines. Having alcoholism. What are the signs or symptoms? In some cases, there are no symptoms. If the condition leads to too few blood cells or nerve damage, symptoms can occur, such as: Feeling weak or tired. Not being hungry. Losing feeling (numbness) or tingling in your hands and feet. Redness and burning of the tongue. Feeling sad (depressed). Confusion or memory problems. Trouble walking. If anemia is very bad, symptoms can include: Being short of breath. Being dizzy. Having a very fast heartbeat. How is this treated? Changing the way you eat and drink, such as: Eating more foods that contain vitamin B12. Drinking little or no alcohol. Getting vitamin B12 shots. Taking vitamin B12 supplements by mouth (orally). Your doctor will tell you the dose that is best for you. Follow  these instructions at home: Eating and drinking  Eat foods that come from animals and have a lot of vitamin B12 in them. These include: Meats and poultry. This includes beef, pork, chicken, turkey, and organ meats, such as liver. Seafood, such as clams, rainbow trout, salmon, tuna, and haddock. Eggs. Dairy foods such as milk, yogurt, and cheese. Eat breakfast cereals that have vitamin B12 added to them (are fortified). Check the label. The items listed above may not be a complete list of foods and beverages you can eat and drink. Contact a dietitian for more information. Alcohol use Do not drink alcohol if: Your doctor tells you not to drink. You are pregnant, may be pregnant, or are planning to become pregnant. If you drink alcohol: Limit how much you have to: 0-1 drink a day for women. 0-2 drinks a day for men. Know how much alcohol is in your drink. In the [place] one drink equals one 12 oz bottle of beer (355 mL), one 5 oz glass of wine (148 mL), or one 1 oz glass of hard liquor (44 mL). General instructions Get any vitamin B12 shots if told by your doctor. Take supplements only as told by your doctor. Follow the directions. Keep all follow-up visits. Contact a doctor if: Your symptoms come back. Your symptoms get worse or do not get better with treatment. Get help right away if: You have trouble breathing. You have a very fast heartbeat. You have chest pain. You get dizzy. You faint. These symptoms may be an emergency. Get help right away. Call 911.   Do not wait to see if the symptoms will go away. Do not drive yourself to the hospital. Summary Vitamin B12 deficiency means that your body is not getting enough of the vitamin. In some cases, there are no symptoms of this condition. Treatment may include making a change in the way you eat and drink, getting shots, or taking supplements. Eat foods that have vitamin B12 in them. This information is not intended to replace advice  given to you by your health care provider. Make sure you discuss any questions you have with your health care provider. Document Revised: 06/11/2021 Document Reviewed: 06/11/2021 Elsevier Patient Education  2023 Elsevier Inc.  

## 2022-04-05 ENCOUNTER — Inpatient Hospital Stay: Payer: Medicare PPO

## 2022-04-05 VITALS — BP 149/74 | HR 91 | Temp 98.2°F | Resp 18

## 2022-04-05 DIAGNOSIS — D500 Iron deficiency anemia secondary to blood loss (chronic): Secondary | ICD-10-CM

## 2022-04-05 DIAGNOSIS — D509 Iron deficiency anemia, unspecified: Secondary | ICD-10-CM | POA: Diagnosis not present

## 2022-04-05 MED ORDER — SODIUM CHLORIDE 0.9 % IV SOLN: Freq: Once | INTRAVENOUS | Status: AC

## 2022-04-05 MED ORDER — CYANOCOBALAMIN 1000 MCG/ML IJ SOLN
1000.0000 ug | Freq: Once | INTRAMUSCULAR | Status: AC
  Administered 2022-04-05: 1000 ug via INTRAMUSCULAR
  Filled 2022-04-05: qty 1

## 2022-04-05 MED ORDER — SODIUM CHLORIDE 0.9 % IV SOLN
200.0000 mg | Freq: Once | INTRAVENOUS | Status: AC
  Administered 2022-04-05: 200 mg via INTRAVENOUS
  Filled 2022-04-05: qty 200

## 2022-04-05 MED FILL — Iron Sucrose Inj 20 MG/ML (Fe Equiv): INTRAVENOUS | Qty: 10 | Status: AC

## 2022-04-05 NOTE — Patient Instructions (Signed)

## 2022-04-06 ENCOUNTER — Inpatient Hospital Stay: Payer: Medicare PPO

## 2022-04-06 VITALS — BP 138/77 | HR 88 | Temp 98.1°F | Resp 18

## 2022-04-06 DIAGNOSIS — D509 Iron deficiency anemia, unspecified: Secondary | ICD-10-CM | POA: Diagnosis not present

## 2022-04-06 DIAGNOSIS — D500 Iron deficiency anemia secondary to blood loss (chronic): Secondary | ICD-10-CM

## 2022-04-06 MED ORDER — SODIUM CHLORIDE 0.9 % IV SOLN: Freq: Once | INTRAVENOUS | Status: AC

## 2022-04-06 MED ORDER — SODIUM CHLORIDE 0.9 % IV SOLN
200.0000 mg | Freq: Once | INTRAVENOUS | Status: AC
  Administered 2022-04-06: 200 mg via INTRAVENOUS
  Filled 2022-04-06: qty 200

## 2022-04-06 MED ORDER — CYANOCOBALAMIN 1000 MCG/ML IJ SOLN: 1000.0000 ug | Freq: Once | INTRAMUSCULAR | Status: DC

## 2022-04-06 NOTE — Patient Instructions (Signed)

## 2022-04-07 DIAGNOSIS — R54 Age-related physical debility: Secondary | ICD-10-CM

## 2022-04-07 HISTORY — DX: Age-related physical debility: R54

## 2022-04-11 ENCOUNTER — Other Ambulatory Visit: Payer: Self-pay | Admitting: Pharmacist

## 2022-04-12 ENCOUNTER — Inpatient Hospital Stay: Payer: Medicare PPO

## 2022-04-12 VITALS — BP 120/75 | HR 82 | Temp 98.0°F | Resp 18 | Ht 73.0 in | Wt 227.1 lb

## 2022-04-12 DIAGNOSIS — D500 Iron deficiency anemia secondary to blood loss (chronic): Secondary | ICD-10-CM

## 2022-04-12 DIAGNOSIS — D509 Iron deficiency anemia, unspecified: Secondary | ICD-10-CM | POA: Diagnosis not present

## 2022-04-12 MED ORDER — CYANOCOBALAMIN 1000 MCG/ML IJ SOLN
1000.0000 ug | Freq: Once | INTRAMUSCULAR | Status: AC
  Administered 2022-04-12: 1000 ug via INTRAMUSCULAR
  Filled 2022-04-12: qty 1

## 2022-04-12 NOTE — Patient Instructions (Signed)
Vitamin B12 Deficiency Vitamin B12 deficiency means that your body does not have enough vitamin B12. The body needs this important vitamin: To make red blood cells. To make genes (DNA). To help the nerves work. If you do not have enough vitamin B12 in your body, you can have health problems, such as not having enough red blood cells in the blood (anemia). What are the causes? Not eating enough foods that contain vitamin B12. Not being able to take in (absorb) vitamin B12 from the food that you eat. Certain diseases. A condition in which the body does not make enough of a certain protein. This results in your body not taking in enough vitamin B12. Having a surgery in which part of the stomach or small intestine is taken out. Taking medicines that make it hard for the body to take in vitamin B12. These include: Heartburn medicines. Some medicines that are used to treat diabetes. What increases the risk? Being an older adult. Eating a vegetarian or vegan diet that does not include any foods that come from animals. Not eating enough foods that contain vitamin B12 while you are pregnant. Taking certain medicines. Having alcoholism. What are the signs or symptoms? In some cases, there are no symptoms. If the condition leads to too few blood cells or nerve damage, symptoms can occur, such as: Feeling weak or tired. Not being hungry. Losing feeling (numbness) or tingling in your hands and feet. Redness and burning of the tongue. Feeling sad (depressed). Confusion or memory problems. Trouble walking. If anemia is very bad, symptoms can include: Being short of breath. Being dizzy. Having a very fast heartbeat. How is this treated? Changing the way you eat and drink, such as: Eating more foods that contain vitamin B12. Drinking little or no alcohol. Getting vitamin B12 shots. Taking vitamin B12 supplements by mouth (orally). Your doctor will tell you the dose that is best for you. Follow  these instructions at home: Eating and drinking  Eat foods that come from animals and have a lot of vitamin B12 in them. These include: Meats and poultry. This includes beef, pork, chicken, turkey, and organ meats, such as liver. Seafood, such as clams, rainbow trout, salmon, tuna, and haddock. Eggs. Dairy foods such as milk, yogurt, and cheese. Eat breakfast cereals that have vitamin B12 added to them (are fortified). Check the label. The items listed above may not be a complete list of foods and beverages you can eat and drink. Contact a dietitian for more information. Alcohol use Do not drink alcohol if: Your doctor tells you not to drink. You are pregnant, may be pregnant, or are planning to become pregnant. If you drink alcohol: Limit how much you have to: 0-1 drink a day for women. 0-2 drinks a day for men. Know how much alcohol is in your drink. In the [place] one drink equals one 12 oz bottle of beer (355 mL), one 5 oz glass of wine (148 mL), or one 1 oz glass of hard liquor (44 mL). General instructions Get any vitamin B12 shots if told by your doctor. Take supplements only as told by your doctor. Follow the directions. Keep all follow-up visits. Contact a doctor if: Your symptoms come back. Your symptoms get worse or do not get better with treatment. Get help right away if: You have trouble breathing. You have a very fast heartbeat. You have chest pain. You get dizzy. You faint. These symptoms may be an emergency. Get help right away. Call 911.   Do not wait to see if the symptoms will go away. Do not drive yourself to the hospital. Summary Vitamin B12 deficiency means that your body is not getting enough of the vitamin. In some cases, there are no symptoms of this condition. Treatment may include making a change in the way you eat and drink, getting shots, or taking supplements. Eat foods that have vitamin B12 in them. This information is not intended to replace advice  given to you by your health care provider. Make sure you discuss any questions you have with your health care provider. Document Revised: 06/11/2021 Document Reviewed: 06/11/2021 Elsevier Patient Education  2023 Elsevier Inc.  

## 2022-04-19 ENCOUNTER — Other Ambulatory Visit: Payer: Self-pay | Admitting: Urology

## 2022-04-19 ENCOUNTER — Other Ambulatory Visit (HOSPITAL_COMMUNITY): Payer: Self-pay | Admitting: Urology

## 2022-04-19 DIAGNOSIS — N310 Uninhibited neuropathic bladder, not elsewhere classified: Secondary | ICD-10-CM

## 2022-04-20 ENCOUNTER — Inpatient Hospital Stay: Payer: Medicare PPO

## 2022-04-20 ENCOUNTER — Other Ambulatory Visit: Payer: Self-pay | Admitting: Pharmacist

## 2022-04-20 VITALS — BP 122/77 | HR 69 | Temp 97.5°F | Resp 18 | Ht 73.0 in | Wt 227.1 lb

## 2022-04-20 DIAGNOSIS — D509 Iron deficiency anemia, unspecified: Secondary | ICD-10-CM | POA: Diagnosis not present

## 2022-04-20 DIAGNOSIS — D500 Iron deficiency anemia secondary to blood loss (chronic): Secondary | ICD-10-CM

## 2022-04-20 MED ORDER — CYANOCOBALAMIN 1000 MCG/ML IJ SOLN
1000.0000 ug | Freq: Once | INTRAMUSCULAR | Status: AC
  Administered 2022-04-20: 1000 ug via INTRAMUSCULAR
  Filled 2022-04-20: qty 1

## 2022-04-20 NOTE — Patient Instructions (Signed)
Vitamin B12 Injection What is this medication? Vitamin B12 (VAHY tuh min B12) prevents and treats low vitamin B12 levels in your body. It is used in people who do not get enough vitamin B12 from their diet or when their digestive tract does not absorb enough. Vitamin B12 plays an important role in maintaining the health of your nervous system and red blood cells. This medicine may be used for other purposes; ask your health care provider or pharmacist if you have questions. COMMON BRAND NAME(S): B-12 Compliance Kit, B-12 Injection Kit, Cyomin, Dodex, LA-12, Nutri-Twelve, Physicians EZ Use B-12, Primabalt What should I tell my care team before I take this medication? They need to know if you have any of these conditions: Kidney disease Leber's disease Megaloblastic anemia An unusual or allergic reaction to cyanocobalamin, cobalt, other medications, foods, dyes, or preservatives Pregnant or trying to get pregnant Breast-feeding How should I use this medication? This medication is injected into a muscle or deeply under the skin. It is usually given in a clinic or care team's office. However, your care team may teach you how to inject yourself. Follow all instructions. Talk to your care team about the use of this medication in children. Special care may be needed. Overdosage: If you think you have taken too much of this medicine contact a poison control center or emergency room at once. NOTE: This medicine is only for you. Do not share this medicine with others. What if I miss a dose? If you are given your dose at a clinic or care team's office, call to reschedule your appointment. If you give your own injections, and you miss a dose, take it as soon as you can. If it is almost time for your next dose, take only that dose. Do not take double or extra doses. What may interact with this medication? Alcohol Colchicine This list may not describe all possible interactions. Give your health care  provider a list of all the medicines, herbs, non-prescription drugs, or dietary supplements you use. Also tell them if you smoke, drink alcohol, or use illegal drugs. Some items may interact with your medicine. What should I watch for while using this medication? Visit your care team regularly. You may need blood work done while you are taking this medication. You may need to follow a special diet. Talk to your care team. Limit your alcohol intake and avoid smoking to get the best benefit. What side effects may I notice from receiving this medication? Side effects that you should report to your care team as soon as possible: Allergic reactions--skin rash, itching, hives, swelling of the face, lips, tongue, or throat Swelling of the ankles, hands, or feet Trouble breathing Side effects that usually do not require medical attention (report to your care team if they continue or are bothersome): Diarrhea This list may not describe all possible side effects. Call your doctor for medical advice about side effects. You may report side effects to FDA at 1-800-FDA-1088. Where should I keep my medication? Keep out of the reach of children. Store at room temperature between 15 and 30 degrees C (59 and 85 degrees F). Protect from light. Throw away any unused medication after the expiration date. NOTE: This sheet is a summary. It may not cover all possible information. If you have questions about this medicine, talk to your doctor, pharmacist, or health care provider.  2023 Elsevier/Gold Standard (2021-06-29 00:00:00)

## 2022-04-27 ENCOUNTER — Inpatient Hospital Stay: Payer: Medicare PPO

## 2022-04-27 ENCOUNTER — Other Ambulatory Visit: Payer: Self-pay | Admitting: Radiology

## 2022-04-27 ENCOUNTER — Other Ambulatory Visit: Payer: Self-pay | Admitting: Student

## 2022-04-27 VITALS — BP 127/76 | HR 78 | Temp 97.8°F | Resp 19 | Ht 73.0 in

## 2022-04-27 DIAGNOSIS — N319 Neuromuscular dysfunction of bladder, unspecified: Secondary | ICD-10-CM

## 2022-04-27 DIAGNOSIS — D509 Iron deficiency anemia, unspecified: Secondary | ICD-10-CM | POA: Diagnosis not present

## 2022-04-27 DIAGNOSIS — D500 Iron deficiency anemia secondary to blood loss (chronic): Secondary | ICD-10-CM

## 2022-04-27 MED ORDER — CYANOCOBALAMIN 1000 MCG/ML IJ SOLN
1000.0000 ug | Freq: Once | INTRAMUSCULAR | Status: AC
  Administered 2022-04-27: 1000 ug via INTRAMUSCULAR
  Filled 2022-04-27: qty 1

## 2022-04-27 NOTE — Patient Instructions (Signed)
Vitamin B12 Injection What is this medication? Vitamin B12 (VAHY tuh min B12) prevents and treats low vitamin B12 levels in your body. It is used in people who do not get enough vitamin B12 from their diet or when their digestive tract does not absorb enough. Vitamin B12 plays an important role in maintaining the health of your nervous system and red blood cells. This medicine may be used for other purposes; ask your health care provider or pharmacist if you have questions. COMMON BRAND NAME(S): B-12 Compliance Kit, B-12 Injection Kit, Cyomin, Dodex, LA-12, Nutri-Twelve, Physicians EZ Use B-12, Primabalt What should I tell my care team before I take this medication? They need to know if you have any of these conditions: Kidney disease Leber's disease Megaloblastic anemia An unusual or allergic reaction to cyanocobalamin, cobalt, other medications, foods, dyes, or preservatives Pregnant or trying to get pregnant Breast-feeding How should I use this medication? This medication is injected into a muscle or deeply under the skin. It is usually given in a clinic or care team's office. However, your care team may teach you how to inject yourself. Follow all instructions. Talk to your care team about the use of this medication in children. Special care may be needed. Overdosage: If you think you have taken too much of this medicine contact a poison control center or emergency room at once. NOTE: This medicine is only for you. Do not share this medicine with others. What if I miss a dose? If you are given your dose at a clinic or care team's office, call to reschedule your appointment. If you give your own injections, and you miss a dose, take it as soon as you can. If it is almost time for your next dose, take only that dose. Do not take double or extra doses. What may interact with this medication? Alcohol Colchicine This list may not describe all possible interactions. Give your health care  provider a list of all the medicines, herbs, non-prescription drugs, or dietary supplements you use. Also tell them if you smoke, drink alcohol, or use illegal drugs. Some items may interact with your medicine. What should I watch for while using this medication? Visit your care team regularly. You may need blood work done while you are taking this medication. You may need to follow a special diet. Talk to your care team. Limit your alcohol intake and avoid smoking to get the best benefit. What side effects may I notice from receiving this medication? Side effects that you should report to your care team as soon as possible: Allergic reactions--skin rash, itching, hives, swelling of the face, lips, tongue, or throat Swelling of the ankles, hands, or feet Trouble breathing Side effects that usually do not require medical attention (report to your care team if they continue or are bothersome): Diarrhea This list may not describe all possible side effects. Call your doctor for medical advice about side effects. You may report side effects to FDA at 1-800-FDA-1088. Where should I keep my medication? Keep out of the reach of children. Store at room temperature between 15 and 30 degrees C (59 and 85 degrees F). Protect from light. Throw away any unused medication after the expiration date. NOTE: This sheet is a summary. It may not cover all possible information. If you have questions about this medicine, talk to your doctor, pharmacist, or health care provider.  2023 Elsevier/Gold Standard (2021-06-29 00:00:00)

## 2022-04-28 ENCOUNTER — Ambulatory Visit (HOSPITAL_COMMUNITY)
Admission: RE | Admit: 2022-04-28 | Discharge: 2022-04-28 | Disposition: A | Discharge status: Home / Self Care | Payer: Medicare PPO | Source: Ambulatory Visit | Attending: Urology | Admitting: Urology

## 2022-04-28 ENCOUNTER — Other Ambulatory Visit: Payer: Self-pay

## 2022-04-28 ENCOUNTER — Encounter (HOSPITAL_COMMUNITY): Payer: Self-pay

## 2022-04-28 DIAGNOSIS — R339 Retention of urine, unspecified: Secondary | ICD-10-CM | POA: Insufficient documentation

## 2022-04-28 DIAGNOSIS — N310 Uninhibited neuropathic bladder, not elsewhere classified: Secondary | ICD-10-CM | POA: Diagnosis not present

## 2022-04-28 DIAGNOSIS — E119 Type 2 diabetes mellitus without complications: Secondary | ICD-10-CM | POA: Insufficient documentation

## 2022-04-28 DIAGNOSIS — N319 Neuromuscular dysfunction of bladder, unspecified: Secondary | ICD-10-CM

## 2022-04-28 LAB — CBC
HCT: 37.9 % — ABNORMAL LOW (ref 39.0–52.0)
Hemoglobin: 11.9 g/dL — ABNORMAL LOW (ref 13.0–17.0)
MCH: 26.5 pg (ref 26.0–34.0)
MCHC: 31.4 g/dL (ref 30.0–36.0)
MCV: 84.4 fL (ref 80.0–100.0)
Platelets: 189 10*3/uL (ref 150–400)
RBC: 4.49 MIL/uL (ref 4.22–5.81)
RDW: 21.7 % — ABNORMAL HIGH (ref 11.5–15.5)
WBC: 8.3 10*3/uL (ref 4.0–10.5)
nRBC: 0 % (ref 0.0–0.2)

## 2022-04-28 LAB — GLUCOSE, CAPILLARY: Glucose-Capillary: 142 mg/dL — ABNORMAL HIGH (ref 70–99)

## 2022-04-28 LAB — PROTIME-INR
INR: 1.1 (ref 0.8–1.2)
Prothrombin Time: 14 seconds (ref 11.4–15.2)

## 2022-04-28 MED ORDER — FENTANYL CITRATE (PF) 100 MCG/2ML IJ SOLN
INTRAMUSCULAR | Status: AC
  Filled 2022-04-28: qty 2

## 2022-04-28 MED ORDER — MIDAZOLAM HCL 2 MG/2ML IJ SOLN
INTRAMUSCULAR | Status: AC | PRN
  Administered 2022-04-28: .5 mg via INTRAVENOUS

## 2022-04-28 MED ORDER — SODIUM CHLORIDE 0.9 % IV SOLN: INTRAVENOUS | Status: DC

## 2022-04-28 MED ORDER — MIDAZOLAM HCL 2 MG/2ML IJ SOLN
INTRAMUSCULAR | Status: AC
  Filled 2022-04-28: qty 2

## 2022-04-28 MED ORDER — FENTANYL CITRATE (PF) 100 MCG/2ML IJ SOLN
INTRAMUSCULAR | Status: AC | PRN
  Administered 2022-04-28: 25 ug via INTRAVENOUS

## 2022-04-28 MED ORDER — LIDOCAINE HCL 1 % IJ SOLN
INTRAMUSCULAR | Status: AC
  Filled 2022-04-28: qty 10

## 2022-04-28 NOTE — Procedures (Signed)
Interventional Radiology Procedure Note  Procedure: Image guided drain placement, supra-pubic catheter  43F pigtail drain.  Complications: None  EBL: None  Recommendations: - Routine drain care  - routine wound care - plan for return and upsize with moderate sedation ~4-6 weeks 1 hr dc home - nursing education for new drain care  Signed,  Dulcy Fanny. Earleen Newport, DO

## 2022-04-28 NOTE — Progress Notes (Signed)
Pt assisted from stretcher to w/c with steady lift-d/c in own w/c (no leg pedals noted on w/c)

## 2022-04-28 NOTE — H&P (Signed)
Chief Complaint: Patient was seen in consultation today for neuropathic bladder, urinary retention  Referring Physician(s): Bell,Eugene D III  Supervising Physician: Corrie Mckusick  Patient Status: [facility] - Out-pt  History of Present Illness: Anthony Mueller is a 71 y.o. male with past medical history of cerebral degeneration with limited mobility.  He has recently developed urinary retention with poor ability to empty his bladder.  COmplete work-up was done with Dr. Link Snuffer who has referred to IR for suprapubic catheter placement.   Patient presents to [facility]iology today in his usual state of health.  He has been NPO.  He does not take blood thinners.  He does have a foley catheter in place.  He is accompanied by his family, however serves as his own advocate.  He is aware of the goals of the procedure today and is agreeable to proceed.   Past Medical History:  Diagnosis Date   Accelerated hypertension 12/31/9922   Acute diastolic (congestive) heart failure ([place]) 03/31/2022   Acute on chronic anemia 01/13/2022   AKI (acute kidney injury) ([place]) 01/13/2022   At risk for fall due to comorbid condition 01/20/2022   B12 deficiency 03/01/2022   Bilateral lower extremity edema 01/13/2022   Calculus of gallbladder with acute cholecystitis without obstruction 02/28/2022   Cardiomegaly 11/10/2021   Cerebellar degeneration ([place])    CHF exacerbation ([place]) 03/31/2022   Cholelithiasis 11/10/2021   Chronic diastolic heart failure ([place]) 03/31/2022   Diabetes mellitus without complication ([place])    DM type 2 with diabetic mixed hyperlipidemia ([place]) 03/04/2020   Does not transfer from wheelchair to toilet 01/20/2022   Drug therapy 02/02/2016   Elevated antinuclear antibody (ANA) level 10/06/2015   Gait disorder 09/07/2016   GERD (gastroesophageal reflux disease)    Gout 08/28/2012   History of 2019 novel coronavirus disease (COVID-19) 10/27/2021   History of colonic polyps 11/14/2011   History of esophageal  stricture 05/02/2018   Hypercholesteremia 03/31/2022   Hypercoagulable state due to persistent atrial fibrillation ([place]) 12/16/2021   Hyperlipidemia    Hypertension    Hypertension, essential, benign 08/28/2012   Hypertensive heart disease 01/05/2022   Hypertensive heart disease with acute on chronic diastolic congestive heart failure ([place]) 03/31/2022   Hypertensive urgency 03/31/2022   Hypokalemia 11/10/2021   Hypomagnesemia 01/13/2022   Intertrigo 09/03/2018   Iron deficiency anemia due to chronic blood loss 03/01/2022   Long term (current) use of anticoagulants 12/16/2021   Malaise and fatigue 03/31/2022   Muscular deconditioning 12/23/2021   Occult GI bleeding 11/06/2017   OSA (obstructive sleep apnea) 01/13/2022   Formatting of this note might be different from the original. WEARS CPAP   Persistent atrial fibrillation ([place]) 11/13/2021   Formatting of this note might be different from the original. Last Assessment & Plan:  Formatting of this note might be different from the original. - Patient noted to be in atrial fibrillation, bradycardia with sinus pauses -Per cardiology, started on apixaban, will need to be stopped prior to his upcoming rectal surgery (stop 5 days prior to the surgery and then resume once cleared by surgery se   Positive ANA (antinuclear antibody) 10/06/2015   Postoperative anemia due to acute blood loss 01/20/2022   Postoperative examination 02/28/2022   Pulmonary edema 03/31/2022   Rectal bleeding 01/14/2022   RLS (restless legs syndrome) 09/08/2016   Shortness of breath 03/31/2022   Skin cancer    Transaminitis 11/10/2021   Trigger middle finger of left hand 10/07/2015   Type 2  diabetes mellitus with hyperlipidemia ([place]) 11/10/2021   Unspecified atrial fibrillation (HCC) with sinus pauses 11/13/2021    Past Surgical History:  Procedure Laterality Date   APPENDECTOMY     CATARACT EXTRACTION, BILATERAL     CHOLECYSTECTOMY     HEMORRHOID SURGERY     POLYPECTOMY     ULNAR NERVE REPAIR  Left     Allergies: Liraglutide, Hydrocodone, Hydrocodone-acetaminophen, Other, Tape, Latex, and Metronidazole  Medications: Prior to Admission medications   Medication Sig Start Date End Date Taking? Authorizing Provider  albuterol (VENTOLIN HFA) 108 (90 Base) MCG/ACT inhaler Inhale 2 puffs into the lungs every 6 (six) hours as needed for wheezing or shortness of breath. Patient not taking: Reported on 04/01/2022 11/16/21   Rai, Vernelle Emerald, MD  allopurinol (ZYLOPRIM) 100 MG tablet Take 100 mg by mouth in the morning.    [provider]  apixaban (ELIQUIS) 5 MG TABS tablet Take 1 tablet (5 mg total) by mouth 2 (two) times daily. Patient not taking: Reported on 04/01/2022 01/17/22   Darliss Cheney, MD  ascorbic acid (VITAMIN C) 500 MG tablet Take 1 tablet (500 mg total) by mouth daily. 11/17/21   Rai, Vernelle Emerald, MD  aspirin EC 81 MG tablet Take 81 mg by mouth daily.    [provider]  candesartan (ATACAND) 32 MG tablet Take 32 mg by mouth daily. Patient not taking: Reported on 04/01/2022 09/13/21   [provider]  carvedilol (COREG) 25 MG tablet Take 25 mg by mouth 2 (two) times daily. 10/19/21   [provider]  cloNIDine (CATAPRES) 0.2 MG tablet Take 0.2 mg by mouth 2 (two) times daily. 10/28/21   [provider]  finasteride (PROSCAR) 5 MG tablet Take 5 mg by mouth daily. 02/24/22   [provider]  furosemide (LASIX) 40 MG tablet Take 40 mg by mouth every Monday, Wednesday, and Friday. 03/14/22   [provider]  hydrALAZINE (APRESOLINE) 25 MG tablet Take 37.5 mg by mouth in the morning, at noon, and at bedtime. 03/08/22   [provider]  isosorbide dinitrate (ISORDIL) 10 MG tablet Take 10 mg by mouth 3 (three) times daily. 02/17/22   [provider]  KLOR-CON M15 15 MEQ tablet Take 15 mEq by mouth every Monday, Wednesday, and Friday. 03/15/22   [provider]  lovastatin (MEVACOR) 40 MG tablet Take 40 mg by  mouth every evening. 09/20/21   [provider]  Magnesium Glycinate 665 MG CAPS Take 665 mg by mouth daily.    [provider]  metFORMIN (GLUCOPHAGE) 500 MG tablet Take 1,000 mg by mouth 2 (two) times daily. 08/17/21   [provider]  omeprazole (PRILOSEC) 40 MG capsule Take 40 mg by mouth daily. 02/04/22   [provider]  oxaprozin (DAYPRO) 600 MG tablet Take 1,200 mg by mouth daily as needed (Gout).    [provider]  SLOW RELEASE IRON 45 MG TBCR Take 45 mg by mouth daily. 02/04/22   [provider]  spironolactone (ALDACTONE) 25 MG tablet Take 25 mg by mouth every Monday, Wednesday, and Friday. 02/16/22   [provider]  tamsulosin (FLOMAX) 0.4 MG CAPS capsule Take 0.4 mg by mouth daily. 08/30/21   [provider]  timolol (TIMOPTIC) 0.5 % ophthalmic solution Place 1 drop into both eyes daily. 09/13/21   [provider]  TRADJENTA 5 MG TABS tablet Take 5 mg by mouth daily. 09/20/21   [provider]  zinc sulfate 220 (50  Zn) MG capsule Take 1 capsule (220 mg total) by mouth daily. 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  Tobacco Use   Smoking status: Former    Types: Cigarettes    Passive exposure: Never   Smokeless tobacco: Never  Vaping Use   Vaping Use: Never used  Substance and Sexual Activity   Alcohol use: Yes    Comment: RARELY   Drug use: Never   Sexual activity: Not Currently  Other Topics Concern   Not on file  Social History Narrative   Not on file    Social Determinants of Health   Financial Resource Strain: Not on file  Food Insecurity: Not on file  Transportation Needs: Not on file  Physical Activity: Not on file  Stress: Not on file  Social Connections: Not on file     Review of Systems: A 12 point ROS discussed and pertinent positives are indicated in the HPI above.  All other systems are negative.  Review of Systems  Constitutional:  Negative for fatigue and fever.  Respiratory:  Negative for cough and shortness of breath.   Cardiovascular:  Negative for chest pain.  Gastrointestinal:  Negative for abdominal pain and nausea.  Genitourinary:  Negative for dysuria.  Musculoskeletal:  Negative for back pain.  Neurological:  Positive for weakness (chronic). Negative for dizziness and light-headedness.  Psychiatric/Behavioral:  Negative for behavioral problems and confusion.     Vital Signs: BP (!) 155/84   Pulse 69   Temp 97.8 F (36.6 C) (Temporal)   Resp 18   Ht '6\' 1"'$  (1.854 m)   Wt 228 lb (103.4 kg)   SpO2 97%   BMI 30.08 kg/m   Physical Exam Vitals and nursing note reviewed.  Constitutional:      General: He is not in acute distress.    Appearance: Normal appearance. He is not ill-appearing.  HENT:     Mouth/Throat:     Mouth: Mucous membranes are moist.     Pharynx: Oropharynx is clear.  Cardiovascular:     Rate and Rhythm: Normal rate and regular rhythm.  Pulmonary:     Effort: Pulmonary effort is normal. No respiratory distress.     Breath sounds: Normal breath sounds.  Abdominal:     General: Abdomen is flat.     Palpations: Abdomen is soft.  Skin:    General: Skin is warm and dry.  Neurological:     General: No focal deficit present.     Mental Status: He is alert and oriented to person, place, and time. Mental status is at baseline.  Psychiatric:        Mood and Affect: Mood normal.        Behavior: Behavior normal.        Thought Content: Thought content normal.        Judgment:  Judgment normal.      MD Evaluation Airway: WNL Heart: WNL Abdomen: WNL Chest/  Lungs: WNL ASA  Classification: 3 Mallampati/Airway Score: Two   Imaging: No results found.  Labs:  CBC: Recent Labs    01/14/22 0414 01/14/22 1310 01/15/22 0729 01/26/22 2234 03/01/22 0000  WBC 7.5  --  11.0* 8.8 6.4  HGB 7.0* 7.8* 8.5* 8.6* 10.1*  HCT 22.9* 24.6* 28.1* 28.4* 33*  PLT 305  --  360 277 205    COAGS: Recent Labs    11/10/21 0806  INR 1.0    BMP: Recent Labs    01/14/22 0414 01/15/22 0729 01/26/22 2234 01/27/22 1711  NA 141 138 139 141  K 2.7* 3.7 2.8* 3.4*  CL 95* 98 95* 101  CO2 32 29 32 32  GLUCOSE 126* 154* 162* 182*  BUN '15 14 20 19  '$ CALCIUM 7.7* 8.9 8.8* 9.1  CREATININE 1.30* 1.37* 1.39* 1.39*  GFRNONAA 58* 55* 54* 54*    LIVER FUNCTION TESTS: Recent Labs    11/12/21 0226 11/13/21 0149 11/14/21 0231 11/15/21 0445  BILITOT 0.8 0.8 0.8 0.7  AST 51* 39 42* 38  ALT 116* 104* 112* 108*  ALKPHOS 69 66 76 66  PROT 6.6 6.4* 7.4 6.9  ALBUMIN 2.8* 2.7* 2.9* 2.9*    TUMOR MARKERS: No results for input(s): "AFPTM", "CEA", "CA199", "CHROMGRNA" in the last 8760 hours.  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  IR consulted for suprapubic cathter placement at the request of Dr. Link Snuffer. Case reviewed by Dr. Earleen Newport who approves patient for procedure.  Patient presents today in their usual state of health. He does have a foley in place.  He is accompanied by family who is aware that an exchange and upsize may be needed after today's procedure.  He has been NPO and is not currently on blood thinners.   Risks and benefits discussed with the patient including bleeding, infection, damage to adjacent structures, and sepsis.  All of the patient's questions were answered, patient is agreeable to proceed. Consent signed and in chart.   Thank you for this  interesting consult.  I greatly enjoyed meeting Yasser Hepp and look forward to participating in their care.  A copy of this report was sent to the requesting provider on this date.  Electronically Signed: Docia Barrier, PA 04/28/2022, 10:02 AM   I spent a total of  30 Minutes   in face to face in clinical consultation, greater than 50% of which was counseling/coordinating care for neuropathic bladder, urinary retention.

## 2022-04-29 ENCOUNTER — Encounter: Payer: Self-pay | Admitting: Cardiology

## 2022-05-25 ENCOUNTER — Inpatient Hospital Stay: Payer: Medicare PPO | Attending: Oncology

## 2022-05-25 ENCOUNTER — Other Ambulatory Visit (HOSPITAL_COMMUNITY): Payer: Self-pay | Admitting: Student

## 2022-05-25 ENCOUNTER — Other Ambulatory Visit (HOSPITAL_COMMUNITY): Payer: Self-pay | Admitting: Urology

## 2022-05-25 VITALS — BP 107/56 | HR 62 | Temp 98.6°F | Resp 18

## 2022-05-25 DIAGNOSIS — E538 Deficiency of other specified B group vitamins: Secondary | ICD-10-CM | POA: Insufficient documentation

## 2022-05-25 DIAGNOSIS — N310 Uninhibited neuropathic bladder, not elsewhere classified: Secondary | ICD-10-CM

## 2022-05-25 DIAGNOSIS — D509 Iron deficiency anemia, unspecified: Secondary | ICD-10-CM | POA: Insufficient documentation

## 2022-05-25 DIAGNOSIS — D500 Iron deficiency anemia secondary to blood loss (chronic): Secondary | ICD-10-CM

## 2022-05-25 MED ORDER — CYANOCOBALAMIN 1000 MCG/ML IJ SOLN
1000.0000 ug | Freq: Once | INTRAMUSCULAR | Status: AC
  Administered 2022-05-25: 1000 ug via INTRAMUSCULAR
  Filled 2022-05-25: qty 1

## 2022-05-25 NOTE — Patient Instructions (Signed)
Vitamin B12 Injection What is this medication? Vitamin B12 (VAHY tuh min B12) prevents and treats low vitamin B12 levels in your body. It is used in people who do not get enough vitamin B12 from their diet or when their digestive tract does not absorb enough. Vitamin B12 plays an important role in maintaining the health of your nervous system and red blood cells. This medicine may be used for other purposes; ask your health care provider or pharmacist if you have questions. COMMON BRAND NAME(S): B-12 Compliance Kit, B-12 Injection Kit, Cyomin, Dodex, LA-12, Nutri-Twelve, Physicians EZ Use B-12, Primabalt What should I tell my care team before I take this medication? They need to know if you have any of these conditions: Kidney disease Leber's disease Megaloblastic anemia An unusual or allergic reaction to cyanocobalamin, cobalt, other medications, foods, dyes, or preservatives Pregnant or trying to get pregnant Breast-feeding How should I use this medication? This medication is injected into a muscle or deeply under the skin. It is usually given in a clinic or care team's office. However, your care team may teach you how to inject yourself. Follow all instructions. Talk to your care team about the use of this medication in children. Special care may be needed. Overdosage: If you think you have taken too much of this medicine contact a poison control center or emergency room at once. NOTE: This medicine is only for you. Do not share this medicine with others. What if I miss a dose? If you are given your dose at a clinic or care team's office, call to reschedule your appointment. If you give your own injections, and you miss a dose, take it as soon as you can. If it is almost time for your next dose, take only that dose. Do not take double or extra doses. What may interact with this medication? Alcohol Colchicine This list may not describe all possible interactions. Give your health care  provider a list of all the medicines, herbs, non-prescription drugs, or dietary supplements you use. Also tell them if you smoke, drink alcohol, or use illegal drugs. Some items may interact with your medicine. What should I watch for while using this medication? Visit your care team regularly. You may need blood work done while you are taking this medication. You may need to follow a special diet. Talk to your care team. Limit your alcohol intake and avoid smoking to get the best benefit. What side effects may I notice from receiving this medication? Side effects that you should report to your care team as soon as possible: Allergic reactions--skin rash, itching, hives, swelling of the face, lips, tongue, or throat Swelling of the ankles, hands, or feet Trouble breathing Side effects that usually do not require medical attention (report to your care team if they continue or are bothersome): Diarrhea This list may not describe all possible side effects. Call your doctor for medical advice about side effects. You may report side effects to FDA at 1-800-FDA-1088. Where should I keep my medication? Keep out of the reach of children. Store at room temperature between 15 and 30 degrees C (59 and 85 degrees F). Protect from light. Throw away any unused medication after the expiration date. NOTE: This sheet is a summary. It may not cover all possible information. If you have questions about this medicine, talk to your doctor, pharmacist, or health care provider.  2023 Elsevier/Gold Standard (2021-06-29 00:00:00)

## 2022-06-02 ENCOUNTER — Other Ambulatory Visit: Payer: Self-pay | Admitting: Radiology

## 2022-06-06 ENCOUNTER — Ambulatory Visit (HOSPITAL_COMMUNITY)
Admission: RE | Admit: 2022-06-06 | Discharge: 2022-06-06 | Disposition: A | Discharge status: Home / Self Care | Payer: Medicare PPO | Source: Ambulatory Visit | Attending: Urology | Admitting: Urology

## 2022-06-06 ENCOUNTER — Encounter (HOSPITAL_COMMUNITY): Payer: Self-pay

## 2022-06-06 DIAGNOSIS — R339 Retention of urine, unspecified: Secondary | ICD-10-CM | POA: Diagnosis not present

## 2022-06-06 DIAGNOSIS — E119 Type 2 diabetes mellitus without complications: Secondary | ICD-10-CM | POA: Diagnosis not present

## 2022-06-06 DIAGNOSIS — Z435 Encounter for attention to cystostomy: Secondary | ICD-10-CM | POA: Diagnosis present

## 2022-06-06 DIAGNOSIS — N310 Uninhibited neuropathic bladder, not elsewhere classified: Secondary | ICD-10-CM | POA: Insufficient documentation

## 2022-06-06 HISTORY — PX: IR CATHETER TUBE CHANGE: IMG717

## 2022-06-06 LAB — GLUCOSE, CAPILLARY: Glucose-Capillary: 141 mg/dL — ABNORMAL HIGH (ref 70–99)

## 2022-06-06 MED ORDER — SODIUM CHLORIDE 0.9 % IV SOLN: INTRAVENOUS | Status: DC

## 2022-06-06 MED ORDER — IOHEXOL 300 MG/ML  SOLN
50.0000 mL | Freq: Once | INTRAMUSCULAR | Status: AC | PRN
  Administered 2022-06-06: 20 mL

## 2022-06-06 MED ORDER — FENTANYL CITRATE (PF) 100 MCG/2ML IJ SOLN
INTRAMUSCULAR | Status: AC
  Filled 2022-06-06: qty 2

## 2022-06-06 MED ORDER — MIDAZOLAM HCL 2 MG/2ML IJ SOLN
INTRAMUSCULAR | Status: AC | PRN
  Administered 2022-06-06: 1 mg via INTRAVENOUS

## 2022-06-06 MED ORDER — FENTANYL CITRATE (PF) 100 MCG/2ML IJ SOLN
INTRAMUSCULAR | Status: AC | PRN
  Administered 2022-06-06: 25 ug via INTRAVENOUS

## 2022-06-06 MED ORDER — LIDOCAINE HCL 1 % IJ SOLN
INTRAMUSCULAR | Status: AC
  Administered 2022-06-06: 10 mL
  Filled 2022-06-06: qty 20

## 2022-06-06 MED ORDER — MIDAZOLAM HCL 2 MG/2ML IJ SOLN
INTRAMUSCULAR | Status: AC
  Filled 2022-06-06: qty 2

## 2022-06-06 MED ORDER — LIDOCAINE VISCOUS HCL 2 % MT SOLN
OROMUCOSAL | Status: AC
  Filled 2022-06-06: qty 15

## 2022-06-06 NOTE — Procedures (Signed)
Interventional Radiology Procedure Note  Procedure:   Image guided exchange and upsize of a supra-pubic catheter.  New 34F balloon retention council tip foley catheter into the urinary bladder.   Ok to make further exchanges at the bedside/urology office/home health starting after 4-6 weeks.    Complications: None  Recommendations:  - 1 hr dc home - advance diet - OK to start bedside exchanges for 34F foley balloon retention starting after 4-6 weeks  - expect some hematuria to clear over the next few days - Routine wound care   Signed,  Dulcy Fanny. Earleen Newport, DO

## 2022-06-06 NOTE — H&P (Signed)
Chief Complaint: Patient was seen in consultation today for neuropathic bladder, urinary retention  Referring Physician(s): Bell,Eugene D III  Supervising Physician: Corrie Mckusick  Patient Status: [facility] - Out-pt  History of Present Illness: Anthony Mueller is a 71 y.o. male with past medical history of cerebral degeneration with limited mobility.  He has recently developed urinary retention with poor ability to empty his bladder and underwent suprapubic catheter placement 04/28/22 with Dr. Earleen Newport.  He returns today for routine exchange and upsize. He is accompanied by his daughter who assists with care at home.  She asks appropriate questions and is understanding of related care needs.   Patient presents to [facility] today in his usual state of health.  He has been NPO.  He does not take blood thinners.  He has no complaints related to his SPT today.   Past Medical History:  Diagnosis Date   Accelerated hypertension 02/02/8098   Acute diastolic (congestive) heart failure ([place]) 03/31/2022   Acute on chronic anemia 01/13/2022   AKI (acute kidney injury) ([place]) 01/13/2022   At risk for fall due to comorbid condition 01/20/2022   B12 deficiency 03/01/2022   Bilateral lower extremity edema 01/13/2022   Calculus of gallbladder with acute cholecystitis without obstruction 02/28/2022   Cardiomegaly 11/10/2021   Cerebellar degeneration ([place])    CHF exacerbation ([place]) 03/31/2022   Cholelithiasis 11/10/2021   Chronic diastolic heart failure ([place]) 03/31/2022   Diabetes mellitus without complication ([place])    DM type 2 with diabetic mixed hyperlipidemia ([place]) 03/04/2020   Does not transfer from wheelchair to toilet 01/20/2022   Drug therapy 02/02/2016   Elevated antinuclear antibody (ANA) level 10/06/2015   Gait disorder 09/07/2016   GERD (gastroesophageal reflux disease)    Gout 08/28/2012   History of 2019 novel coronavirus disease (COVID-19) 10/27/2021   History of colonic polyps 11/14/2011   History of  esophageal stricture 05/02/2018   Hypercholesteremia 03/31/2022   Hypercoagulable state due to persistent atrial fibrillation ([place]) 12/16/2021   Hyperlipidemia    Hypertension    Hypertension, essential, benign 08/28/2012   Hypertensive heart disease 01/05/2022   Hypertensive heart disease with acute on chronic diastolic congestive heart failure ([place]) 03/31/2022   Hypertensive urgency 03/31/2022   Hypokalemia 11/10/2021   Hypomagnesemia 01/13/2022   Intertrigo 09/03/2018   Iron deficiency anemia due to chronic blood loss 03/01/2022   Long term (current) use of anticoagulants 12/16/2021   Malaise and fatigue 03/31/2022   Muscular deconditioning 12/23/2021   Occult GI bleeding 11/06/2017   OSA (obstructive sleep apnea) 01/13/2022   Formatting of this note might be different from the original. WEARS CPAP   Persistent atrial fibrillation ([place]) 11/13/2021   Formatting of this note might be different from the original. Last Assessment & Plan:  Formatting of this note might be different from the original. - Patient noted to be in atrial fibrillation, bradycardia with sinus pauses -Per cardiology, started on apixaban, will need to be stopped prior to his upcoming rectal surgery (stop 5 days prior to the surgery and then resume once cleared by surgery se   Positive ANA (antinuclear antibody) 10/06/2015   Postoperative anemia due to acute blood loss 01/20/2022   Postoperative examination 02/28/2022   Pulmonary edema 03/31/2022   Rectal bleeding 01/14/2022   RLS (restless legs syndrome) 09/08/2016   Shortness of breath 03/31/2022   Skin cancer    Transaminitis 11/10/2021   Trigger middle finger of left hand 10/07/2015   Type 2 diabetes mellitus with hyperlipidemia (  [place]) 11/10/2021   Unspecified atrial fibrillation ([place]) with sinus pauses 11/13/2021    Past Surgical History:  Procedure Laterality Date   APPENDECTOMY     CATARACT EXTRACTION, BILATERAL     CHOLECYSTECTOMY     HEMORRHOID SURGERY     POLYPECTOMY     ULNAR NERVE  REPAIR Left     Allergies: Victoza [liraglutide], Hydrocodone, Hydrocodone-acetaminophen, Other, Tape, Latex, and Metronidazole  Medications: Prior to Admission medications   Medication Sig Start Date End Date Taking? Authorizing Provider  albuterol (VENTOLIN HFA) 108 (90 Base) MCG/ACT inhaler Inhale 2 puffs into the lungs every 6 (six) hours as needed for wheezing or shortness of breath. Patient not taking: Reported on 04/01/2022 11/16/21   Rai, Vernelle Emerald, MD  allopurinol (ZYLOPRIM) 100 MG tablet Take 100 mg by mouth in the morning.    [provider]  apixaban (ELIQUIS) 5 MG TABS tablet Take 1 tablet (5 mg total) by mouth 2 (two) times daily. Patient not taking: Reported on 04/01/2022 01/17/22   Darliss Cheney, MD  ascorbic acid (VITAMIN C) 500 MG tablet Take 1 tablet (500 mg total) by mouth daily. 11/17/21   Rai, Vernelle Emerald, MD  aspirin EC 81 MG tablet Take 81 mg by mouth daily.    [provider]  candesartan (ATACAND) 32 MG tablet Take 32 mg by mouth daily. Patient not taking: Reported on 04/01/2022 09/13/21   [provider]  carvedilol (COREG) 25 MG tablet Take 25 mg by mouth 2 (two) times daily. 10/19/21   [provider]  cloNIDine (CATAPRES) 0.2 MG tablet Take 0.2 mg by mouth 2 (two) times daily. 10/28/21   [provider]  finasteride (PROSCAR) 5 MG tablet Take 5 mg by mouth daily. 02/24/22   [provider]  furosemide (LASIX) 40 MG tablet Take 40 mg by mouth every Monday, Wednesday, and Friday. 03/14/22   [provider]  hydrALAZINE (APRESOLINE) 25 MG tablet Take 37.5 mg by mouth in the morning, at noon, and at bedtime. 03/08/22   [provider]  isosorbide dinitrate (ISORDIL) 10 MG tablet Take 10 mg by mouth 3 (three) times daily. 02/17/22   [provider]  KLOR-CON M15 15 MEQ tablet Take 15 mEq by mouth every Monday, Wednesday, and Friday. 03/15/22   [provider]  lovastatin (MEVACOR) 40 MG tablet  Take 40 mg by mouth every evening. 09/20/21   [provider]  Magnesium Glycinate 665 MG CAPS Take 665 mg by mouth daily.    [provider]  metFORMIN (GLUCOPHAGE) 500 MG tablet Take 1,000 mg by mouth 2 (two) times daily. 08/17/21   [provider]  omeprazole (PRILOSEC) 40 MG capsule Take 40 mg by mouth daily. 02/04/22   [provider]  oxaprozin (DAYPRO) 600 MG tablet Take 1,200 mg by mouth daily as needed (Gout).    [provider]  SLOW RELEASE IRON 45 MG TBCR Take 45 mg by mouth daily. 02/04/22   [provider]  spironolactone (ALDACTONE) 25 MG tablet Take 25 mg by mouth every Monday, Wednesday, and Friday. 02/16/22   [provider]  tamsulosin (FLOMAX) 0.4 MG CAPS capsule Take 0.4 mg by mouth daily. 08/30/21   [provider]  timolol (TIMOPTIC) 0.5 % ophthalmic solution Place 1 drop into both eyes daily. 09/13/21   [provider]  TRADJENTA 5 MG TABS tablet Take 5 mg by mouth daily. 09/20/21   [provider]  zinc sulfate 220 (50 Zn) MG capsule  Take 1 capsule (220 mg total) by mouth daily. 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  Tobacco Use   Smoking status: Former    Types: Cigarettes    Passive exposure: Never   Smokeless tobacco: Never  Vaping Use   Vaping Use: Never used  Substance and Sexual Activity   Alcohol use: Yes    Comment: RARELY   Drug use: Never   Sexual activity: Not Currently  Other Topics Concern   Not on file  Social History Narrative    Not on file   Social Determinants of Health   Financial Resource Strain: Not on file  Food Insecurity: Not on file  Transportation Needs: Not on file  Physical Activity: Not on file  Stress: Not on file  Social Connections: Not on file     Review of Systems: A 12 point ROS discussed and pertinent positives are indicated in the HPI above.  All other systems are negative.  Review of Systems  Constitutional:  Negative for fatigue and fever.  Respiratory:  Negative for cough and shortness of breath.   Cardiovascular:  Negative for chest pain.  Gastrointestinal:  Negative for abdominal pain and nausea.  Genitourinary:  Negative for dysuria.  Musculoskeletal:  Negative for back pain.  Neurological:  Positive for weakness (chronic). Negative for dizziness and light-headedness.  Psychiatric/Behavioral:  Negative for behavioral problems and confusion.     Vital Signs: BP (!) 167/84   Pulse 68   Temp 97.7 F (36.5 C) (Tympanic)   Resp 16   Ht '6\' 1"'$  (1.854 m)   Wt 228 lb (103.4 kg)   SpO2 96%   BMI 30.08 kg/m   Physical Exam Vitals and nursing note reviewed.  Constitutional:      General: He is not in acute distress.    Appearance: Normal appearance. He is not ill-appearing.  HENT:     Mouth/Throat:     Mouth: Mucous membranes are moist.     Pharynx: Oropharynx is clear.  Cardiovascular:     Rate and Rhythm: Normal rate and regular rhythm.  Pulmonary:     Effort: Pulmonary effort is normal. No respiratory distress.     Breath sounds: Normal breath sounds.  Abdominal:     General: Abdomen is flat.     Palpations: Abdomen is soft.     Comments: SPT in place.  Site intact/clean/dry.  Stitch remains intact.  Yellow urine in gravity bag.   Skin:    General: Skin is warm and dry.  Neurological:     General: No focal deficit present.     Mental Status: He is alert and oriented to person, place, and time. Mental status is at baseline.  Psychiatric:        Mood and  Affect: Mood normal.        Behavior: Behavior normal.        Thought Content: Thought content normal.  Judgment: Judgment normal.      MD Evaluation Airway: WNL Heart: WNL Abdomen: WNL Chest/ Lungs: WNL ASA  Classification: 3 Mallampati/Airway Score: Two   Imaging: No results found.  Labs:  CBC: Recent Labs    01/15/22 0729 01/26/22 2234 03/01/22 0000 04/28/22 0915  WBC 11.0* 8.8 6.4 8.3  HGB 8.5* 8.6* 10.1* 11.9*  HCT 28.1* 28.4* 33* 37.9*  PLT 360 277 205 189    COAGS: Recent Labs    11/10/21 0806 04/28/22 0915  INR 1.0 1.1    BMP: Recent Labs    01/14/22 0414 01/15/22 0729 01/26/22 2234 01/27/22 1711  NA 141 138 139 141  K 2.7* 3.7 2.8* 3.4*  CL 95* 98 95* 101  CO2 32 29 32 32  GLUCOSE 126* 154* 162* 182*  BUN '15 14 20 19  '$ CALCIUM 7.7* 8.9 8.8* 9.1  CREATININE 1.30* 1.37* 1.39* 1.39*  GFRNONAA 58* 55* 54* 54*    LIVER FUNCTION TESTS: Recent Labs    11/12/21 0226 11/13/21 0149 11/14/21 0231 11/15/21 0445  BILITOT 0.8 0.8 0.8 0.7  AST 51* 39 42* 38  ALT 116* 104* 112* 108*  ALKPHOS 69 66 76 66  PROT 6.6 6.4* 7.4 6.9  ALBUMIN 2.8* 2.7* 2.9* 2.9*    TUMOR MARKERS: No results for input(s): "AFPTM", "CEA", "CA199", "CHROMGRNA" in the last 8760 hours.  Assessment and Plan: Patient with past medical history of cerebral degeneration presents with complaint of neuropathic urinary retention and recent SPT placement 04/28/22 presents for routine exchange and upsize.  IR consulted for suprapubic cathter placement at the request of Dr. Link Snuffer. Case reviewed by Dr. Earleen Newport who approves patient for procedure.  Patient presents today in their usual state of health.  He is accompanied by family who is agreeable and able to assist with care. He has been NPO and is not currently on blood thinners.   Risks and benefits discussed with the patient including bleeding, infection, damage to adjacent structures, and sepsis.  All of the  patient's questions were answered, patient is agreeable to proceed. Consent signed and in chart.   Thank you for this interesting consult.  I greatly enjoyed meeting Adetokunbo Mccadden and look forward to participating in their care.  A copy of this report was sent to the requesting provider on this date.  Electronically Signed: Docia Barrier, PA 06/06/2022, 10:58 AM   I spent a total of  30 Minutes   in face to face in clinical consultation, greater than 50% of which was counseling/coordinating care for neuropathic bladder, urinary retention.

## 2022-06-22 ENCOUNTER — Inpatient Hospital Stay: Payer: Medicare PPO | Attending: Oncology

## 2022-06-22 VITALS — BP 126/77 | HR 73 | Temp 98.4°F | Resp 18

## 2022-06-22 DIAGNOSIS — D509 Iron deficiency anemia, unspecified: Secondary | ICD-10-CM | POA: Diagnosis present

## 2022-06-22 DIAGNOSIS — E538 Deficiency of other specified B group vitamins: Secondary | ICD-10-CM | POA: Diagnosis present

## 2022-06-22 DIAGNOSIS — D500 Iron deficiency anemia secondary to blood loss (chronic): Secondary | ICD-10-CM

## 2022-06-22 MED ORDER — CYANOCOBALAMIN 1000 MCG/ML IJ SOLN
1000.0000 ug | Freq: Once | INTRAMUSCULAR | Status: AC
  Administered 2022-06-22: 1000 ug via INTRAMUSCULAR
  Filled 2022-06-22: qty 1

## 2022-06-22 NOTE — Patient Instructions (Signed)
Vitamin B12 Injection What is this medication? Vitamin B12 (VAHY tuh min B12) prevents and treats low vitamin B12 levels in your body. It is used in people who do not get enough vitamin B12 from their diet or when their digestive tract does not absorb enough. Vitamin B12 plays an important role in maintaining the health of your nervous system and red blood cells. This medicine may be used for other purposes; ask your health care provider or pharmacist if you have questions. COMMON BRAND NAME(S): B-12 Compliance Kit, B-12 Injection Kit, Cyomin, Dodex, LA-12, Nutri-Twelve, Physicians EZ Use B-12, Primabalt What should I tell my care team before I take this medication? They need to know if you have any of these conditions: Kidney disease Leber's disease Megaloblastic anemia An unusual or allergic reaction to cyanocobalamin, cobalt, other medications, foods, dyes, or preservatives Pregnant or trying to get pregnant Breast-feeding How should I use this medication? This medication is injected into a muscle or deeply under the skin. It is usually given in a clinic or care team's office. However, your care team may teach you how to inject yourself. Follow all instructions. Talk to your care team about the use of this medication in children. Special care may be needed. Overdosage: If you think you have taken too much of this medicine contact a poison control center or emergency room at once. NOTE: This medicine is only for you. Do not share this medicine with others. What if I miss a dose? If you are given your dose at a clinic or care team's office, call to reschedule your appointment. If you give your own injections, and you miss a dose, take it as soon as you can. If it is almost time for your next dose, take only that dose. Do not take double or extra doses. What may interact with this medication? Alcohol Colchicine This list may not describe all possible interactions. Give your health care  provider a list of all the medicines, herbs, non-prescription drugs, or dietary supplements you use. Also tell them if you smoke, drink alcohol, or use illegal drugs. Some items may interact with your medicine. What should I watch for while using this medication? Visit your care team regularly. You may need blood work done while you are taking this medication. You may need to follow a special diet. Talk to your care team. Limit your alcohol intake and avoid smoking to get the best benefit. What side effects may I notice from receiving this medication? Side effects that you should report to your care team as soon as possible: Allergic reactions--skin rash, itching, hives, swelling of the face, lips, tongue, or throat Swelling of the ankles, hands, or feet Trouble breathing Side effects that usually do not require medical attention (report to your care team if they continue or are bothersome): Diarrhea This list may not describe all possible side effects. Call your doctor for medical advice about side effects. You may report side effects to FDA at 1-800-FDA-1088. Where should I keep my medication? Keep out of the reach of children. Store at room temperature between 15 and 30 degrees C (59 and 85 degrees F). Protect from light. Throw away any unused medication after the expiration date. NOTE: This sheet is a summary. It may not cover all possible information. If you have questions about this medicine, talk to your doctor, pharmacist, or health care provider.  2023 Elsevier/Gold Standard (2020-12-24 00:00:00)

## 2022-07-06 NOTE — Progress Notes (Signed)
[facility]  [address] [place],  [place]  [address] [phone]  Clinic Day:  07/07/2022  Referring physician: Maris Berger, MD  HISTORY OF PRESENT ILLNESS:  The patient is a 71 y.o. 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 He recently received IV iron to bolster his iron stores and improve his hemoglobin.   He has also been on a protracted course of B12 injections.  He comes in today to reassess his anemia.  Since his last visit, the patient has been doing okay.  He denies noticing a significant improvement in how he has felt since receiving his IV iron.  He still has occasional rectal bleeding from hemorrhoids.  However, it has been much better since he had hemorrhoid surgery earlier this year.  PHYSICAL EXAM:  Blood pressure (!) 159/89, pulse 79, temperature 98 F (36.7 C), resp. rate 16, height '6\' 1"'$  (1.854 m), SpO2 93 %. Wt Readings from Last 3 Encounters:  06/06/22 228 lb (103.4 kg)  04/28/22 228 lb (103.4 kg)  04/20/22 227 lb 1.9 oz (103 kg)   Body mass index is 30.08 kg/m. Performance status (ECOG): 2 - Symptomatic, <50% confined to bed Physical Exam Constitutional:      Appearance: Normal appearance. He is not ill-appearing.     Comments: A chronically ill-appearing gentleman who is in a wheelchair.  A Foley catheter is in place  HENT:     Mouth/Throat:     Mouth: Mucous membranes are moist.     Pharynx: Oropharynx is clear. No oropharyngeal exudate or posterior oropharyngeal erythema.  Cardiovascular:     Rate and Rhythm: Normal rate and regular rhythm.     Heart sounds: No murmur heard.    No friction rub. No gallop.  Pulmonary:     Effort: Pulmonary effort is normal. No respiratory distress.     Breath sounds: Normal breath sounds. No wheezing, rhonchi or rales.  Abdominal:     General: Bowel sounds are normal. There is no distension.     Palpations: Abdomen is soft. There is no  mass.     Tenderness: There is no abdominal tenderness.  Musculoskeletal:        General: No swelling.     Right lower leg: No edema.     Left lower leg: No edema.  Lymphadenopathy:     Cervical: No cervical adenopathy.     Upper Body:     Right upper body: No supraclavicular or axillary adenopathy.     Left upper body: No supraclavicular or axillary adenopathy.     Lower Body: No right inguinal adenopathy. No left inguinal adenopathy.  Skin:    General: Skin is warm.     Coloration: Skin is not jaundiced.     Findings: No lesion or rash.  Neurological:     General: No focal deficit present.     Mental Status: He is alert and oriented to person, place, and time. Mental status is at baseline.  Psychiatric:        Mood and Affect: Mood normal.        Behavior: Behavior normal.        Thought Content: Thought content normal.   LABS:    ASSESSMENT & PLAN:  A 71 y.o. male with anemia secondary to both iron deficiency and vitamin B12 deficiency.  Although not ideal, his hemoglobin has definitely improved since receiving IV iron.  His iron levels today  show   Furthermore, his B12 levels show that his protracted course of B12 injections have He knows to continue taking the B12 injections on a monthly basis to ensure adequate fortification of his cobalamin stores.  As he is doing better, I will see him back in 4 months for repeat clinical assessment.  The patient understands all the plans discussed today and is in agreement with them.  I do appreciate Maris Berger, MD for his new consult.   Trameka Dorough Macarthur Critchley, MD

## 2022-07-07 ENCOUNTER — Other Ambulatory Visit: Payer: Self-pay | Admitting: Oncology

## 2022-07-07 ENCOUNTER — Inpatient Hospital Stay: Payer: Medicare PPO

## 2022-07-07 ENCOUNTER — Inpatient Hospital Stay: Payer: Medicare PPO | Attending: Oncology | Admitting: Oncology

## 2022-07-07 VITALS — BP 159/89 | HR 79 | Temp 98.0°F | Resp 16 | Ht 73.0 in

## 2022-07-07 DIAGNOSIS — E538 Deficiency of other specified B group vitamins: Secondary | ICD-10-CM | POA: Diagnosis present

## 2022-07-07 DIAGNOSIS — D500 Iron deficiency anemia secondary to blood loss (chronic): Secondary | ICD-10-CM

## 2022-07-07 DIAGNOSIS — D509 Iron deficiency anemia, unspecified: Secondary | ICD-10-CM | POA: Insufficient documentation

## 2022-07-07 LAB — HEPATIC FUNCTION PANEL
ALT: 20 U/L (ref 10–40)
AST: 15 (ref 14–40)
Alkaline Phosphatase: 47 (ref 25–125)
Bilirubin, Total: 0.6

## 2022-07-07 LAB — COMPREHENSIVE METABOLIC PANEL
Albumin: 4.1 (ref 3.5–5.0)
Calcium: 9 (ref 8.7–10.7)

## 2022-07-07 LAB — FERRITIN: Ferritin: 70 ng/mL (ref 24–336)

## 2022-07-07 LAB — BASIC METABOLIC PANEL
BUN: 24 — AB (ref 4–21)
CO2: 26 — AB (ref 13–22)
Chloride: 102 (ref 99–108)
Creatinine: 1 (ref 0.6–1.3)
Glucose: 123
Potassium: 4.2 mEq/L (ref 3.5–5.1)
Sodium: 139 (ref 137–147)

## 2022-07-07 LAB — CBC AND DIFFERENTIAL
HCT: 35 — AB (ref 41–53)
Hemoglobin: 11.8 — AB (ref 13.5–17.5)
Neutrophils Absolute: 6.25
Platelets: 182 10*3/uL (ref 150–400)
WBC: 8.8

## 2022-07-07 LAB — IRON AND TIBC
Iron: 80 ug/dL (ref 45–182)
Saturation Ratios: 27 % (ref 17.9–39.5)
TIBC: 298 ug/dL (ref 250–450)
UIBC: 218 ug/dL

## 2022-07-07 LAB — CBC: RBC: 3.87 (ref 3.87–5.11)

## 2022-07-07 LAB — VITAMIN B12: Vitamin B-12: 361 pg/mL (ref 180–914)

## 2022-07-07 LAB — FOLATE: Folate: 16.6 ng/mL (ref >5.9)

## 2022-07-09 ENCOUNTER — Encounter: Payer: Self-pay | Admitting: Oncology

## 2022-07-12 DIAGNOSIS — N310 Uninhibited neuropathic bladder, not elsewhere classified: Secondary | ICD-10-CM

## 2022-07-12 HISTORY — DX: Uninhibited neuropathic bladder, not elsewhere classified: N31.0

## 2022-07-20 ENCOUNTER — Inpatient Hospital Stay: Payer: Medicare PPO

## 2022-07-20 VITALS — BP 114/61 | HR 57 | Temp 97.6°F | Resp 18 | Ht 73.0 in

## 2022-07-20 DIAGNOSIS — D500 Iron deficiency anemia secondary to blood loss (chronic): Secondary | ICD-10-CM

## 2022-07-20 DIAGNOSIS — E538 Deficiency of other specified B group vitamins: Secondary | ICD-10-CM | POA: Diagnosis not present

## 2022-07-20 MED ORDER — CYANOCOBALAMIN 1000 MCG/ML IJ SOLN
1000.0000 ug | Freq: Once | INTRAMUSCULAR | Status: AC
  Administered 2022-07-20: 1000 ug via INTRAMUSCULAR
  Filled 2022-07-20: qty 1

## 2022-07-20 NOTE — Patient Instructions (Signed)
Vitamin B12 Injection What is this medication? Vitamin B12 (VAHY tuh min B12) prevents and treats low vitamin B12 levels in your body. It is used in people who do not get enough vitamin B12 from their diet or when their digestive tract does not absorb enough. Vitamin B12 plays an important role in maintaining the health of your nervous system and red blood cells. This medicine may be used for other purposes; ask your health care provider or pharmacist if you have questions. COMMON BRAND NAME(S): B-12 Compliance Kit, B-12 Injection Kit, Cyomin, Dodex, LA-12, Nutri-Twelve, Physicians EZ Use B-12, Primabalt What should I tell my care team before I take this medication? They need to know if you have any of these conditions: Kidney disease Leber's disease Megaloblastic anemia An unusual or allergic reaction to cyanocobalamin, cobalt, other medications, foods, dyes, or preservatives Pregnant or trying to get pregnant Breast-feeding How should I use this medication? This medication is injected into a muscle or deeply under the skin. It is usually given in a clinic or care team's office. However, your care team may teach you how to inject yourself. Follow all instructions. Talk to your care team about the use of this medication in children. Special care may be needed. Overdosage: If you think you have taken too much of this medicine contact a poison control center or emergency room at once. NOTE: This medicine is only for you. Do not share this medicine with others. What if I miss a dose? If you are given your dose at a clinic or care team's office, call to reschedule your appointment. If you give your own injections, and you miss a dose, take it as soon as you can. If it is almost time for your next dose, take only that dose. Do not take double or extra doses. What may interact with this medication? Alcohol Colchicine This list may not describe all possible interactions. Give your health care  provider a list of all the medicines, herbs, non-prescription drugs, or dietary supplements you use. Also tell them if you smoke, drink alcohol, or use illegal drugs. Some items may interact with your medicine. What should I watch for while using this medication? Visit your care team regularly. You may need blood work done while you are taking this medication. You may need to follow a special diet. Talk to your care team. Limit your alcohol intake and avoid smoking to get the best benefit. What side effects may I notice from receiving this medication? Side effects that you should report to your care team as soon as possible: Allergic reactions--skin rash, itching, hives, swelling of the face, lips, tongue, or throat Swelling of the ankles, hands, or feet Trouble breathing Side effects that usually do not require medical attention (report to your care team if they continue or are bothersome): Diarrhea This list may not describe all possible side effects. Call your doctor for medical advice about side effects. You may report side effects to FDA at 1-800-FDA-1088. Where should I keep my medication? Keep out of the reach of children. Store at room temperature between 15 and 30 degrees C (59 and 85 degrees F). Protect from light. Throw away any unused medication after the expiration date. NOTE: This sheet is a summary. It may not cover all possible information. If you have questions about this medicine, talk to your doctor, pharmacist, or health care provider.  2023 Elsevier/Gold Standard (2020-12-24 00:00:00)

## 2022-07-29 ENCOUNTER — Encounter: Payer: Self-pay | Admitting: Oncology

## 2022-07-29 ENCOUNTER — Ambulatory Visit: Payer: Medicare PPO | Attending: Cardiology | Admitting: Cardiology

## 2022-07-29 ENCOUNTER — Encounter: Payer: Self-pay | Admitting: Cardiology

## 2022-07-29 VITALS — BP 169/87 | HR 72 | Ht 73.0 in

## 2022-07-29 DIAGNOSIS — I110 Hypertensive heart disease with heart failure: Secondary | ICD-10-CM

## 2022-07-29 DIAGNOSIS — I4819 Other persistent atrial fibrillation: Secondary | ICD-10-CM | POA: Diagnosis not present

## 2022-07-29 DIAGNOSIS — D500 Iron deficiency anemia secondary to blood loss (chronic): Secondary | ICD-10-CM | POA: Diagnosis not present

## 2022-07-29 DIAGNOSIS — I472 Ventricular tachycardia, unspecified: Secondary | ICD-10-CM | POA: Diagnosis not present

## 2022-07-29 DIAGNOSIS — I5032 Chronic diastolic (congestive) heart failure: Secondary | ICD-10-CM

## 2022-07-29 NOTE — Progress Notes (Signed)
Cardiology Office Note:    Date:  07/29/2022   ID:  Anthony Mueller, DOB 1951/09/05, MRN [id number]  PCP:  Maris Berger, MD  Cardiologist:  Shirlee More, MD    Referring MD: Maris Berger, MD    ASSESSMENT:    1. Persistent atrial fibrillation ([place])   2. VT (ventricular tachycardia) ([place])   3. Hypertensive heart disease with chronic diastolic congestive heart failure ([place])   4. Iron deficiency anemia due to chronic blood loss    PLAN:    In order of problems listed above:  He is in rate controlled atrial fibrillation continue his beta-blocker for rate control and is not anticoagulated with anemia and bleeding No recurrence of ventricular tachycardia on his most recent monitor after correction of severe hypomagnesemia Heart failure is compensated continue his loop diuretic current antihypertensives carvedilol clonidine hydralazine and spironolactone and have asked him and his wife to check blood pressure on a regular basis validated device good technique record and bring to office visits in the future.  He has had no recurrent symptoms of orthostatic hypotension Anemia has improved   Next appointment: 6 months   Medication Adjustments/Labs and Tests Ordered: Current medicines are reviewed at length with the patient today.  Concerns regarding medicines are outlined above.  No orders of the defined types were placed in this encounter.  No orders of the defined types were placed in this encounter.   Chief complaint follow-up for atrial fibrillation   History of Present Illness:    Anthony Mueller is a 71 y.o. 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  Compliance with diet, lifestyle and  medications: Yes  He was recently admitted to [facility] 11/10/2021 11/16/2021 with acute respiratory failure following COVID-19 infection.  He was hypoxic was treated with steroids and received IV Lasix but he does not have a diagnosis of heart failure.  He has noted to be in atrial fibrillation and was anticoagulated and continued on a beta-blocker.   He was seen by heart care in hospital 11/16/2021 for his atrial fibrillation with recommendations to use an outpatient ambulatory heart rhythm monitor with mild pauses in hospital and to continue anticoagulation.   His event monitor was reported 12/10/2021 with continuous atrial fibrillation throughout 14 episodes of a pauses the longest 3.5 seconds while the ventricular ectopy was occasional he had 11 episodes of ventricular tachycardia the longest 15 complexes at a slow rate of 103 bpm  With magnesium repletion his cardiac rhythm improved and a subsequent event monitor showed atrial fibrillation with good heart rate control and no pauses.  He has been seen by hematology for his iron and B12 deficiency requiring IV iron and a protracted course of B12 for improvement.  It is noted he has ongoing intermittent rectal bleeding.  He is seen along with his wife Over all is doing much better tracks his blood pressure at home that tends to run in the 1 47-4 50 systolic range.  He has had previous symptomatic orthostatic hypotension. He is due much better no awareness of his heart rapid heart rhythm syncope chest pain edema or shortness of breath His magnesium depletion is being monitored and treated by his primary care physician He is not anticoagulated Past Medical History:  Diagnosis  Date   Accelerated hypertension 11/03/863   Acute diastolic (congestive) heart failure ([place]) 03/31/2022   Acute on chronic anemia 01/13/2022   AKI (acute kidney injury) ([place]) 01/13/2022   At risk for fall due to comorbid condition 01/20/2022   B12 deficiency  03/01/2022   Bilateral lower extremity edema 01/13/2022   Calculus of gallbladder with acute cholecystitis without obstruction 02/28/2022   Cardiomegaly 11/10/2021   Cerebellar degeneration ([place])    CHF exacerbation ([place]) 03/31/2022   Cholelithiasis 11/10/2021   Chronic diastolic heart failure ([place]) 03/31/2022   Diabetes mellitus without complication ([place])    DM type 2 with diabetic mixed hyperlipidemia ([place]) 03/04/2020   Does not transfer from wheelchair to toilet 01/20/2022   Drug therapy 02/02/2016   Elevated antinuclear antibody (ANA) level 10/06/2015   Gait disorder 09/07/2016   GERD (gastroesophageal reflux disease)    Gout 08/28/2012   History of 2019 novel coronavirus disease (COVID-19) 10/27/2021   History of colonic polyps 11/14/2011   History of esophageal stricture 05/02/2018   Hypercholesteremia 03/31/2022   Hypercoagulable state due to persistent atrial fibrillation ([place]) 12/16/2021   Hyperlipidemia    Hypertension    Hypertension, essential, benign 08/28/2012   Hypertensive heart disease 01/05/2022   Hypertensive heart disease with acute on chronic diastolic congestive heart failure ([place]) 03/31/2022   Hypertensive urgency 03/31/2022   Hypokalemia 11/10/2021   Hypomagnesemia 01/13/2022   Intertrigo 09/03/2018   Iron deficiency anemia due to chronic blood loss 03/01/2022   Long term (current) use of anticoagulants 12/16/2021   Malaise and fatigue 03/31/2022   Muscular deconditioning 12/23/2021   Occult GI bleeding 11/06/2017   OSA (obstructive sleep apnea) 01/13/2022   Formatting of this note might be different from the original. WEARS CPAP   Persistent atrial fibrillation ([place]) 11/13/2021   Formatting of this note might be different from the original. Last Assessment & Plan:  Formatting of this note might be different from the original. - Patient noted to be in atrial fibrillation, bradycardia with sinus pauses -Per cardiology, started on apixaban, will need to be stopped prior to his upcoming rectal  surgery (stop 5 days prior to the surgery and then resume once cleared by surgery se   Positive ANA (antinuclear antibody) 10/06/2015   Postoperative anemia due to acute blood loss 01/20/2022   Postoperative examination 02/28/2022   Pulmonary edema 03/31/2022   Rectal bleeding 01/14/2022   RLS (restless legs syndrome) 09/08/2016   Shortness of breath 03/31/2022   Skin cancer    Transaminitis 11/10/2021   Trigger middle finger of left hand 10/07/2015   Type 2 diabetes mellitus with hyperlipidemia ([place]) 11/10/2021   Unspecified atrial fibrillation ([place]) with sinus pauses 11/13/2021    Past Surgical History:  Procedure Laterality Date   APPENDECTOMY     CATARACT EXTRACTION, BILATERAL     CHOLECYSTECTOMY     HEMORRHOID SURGERY     IR CATHETER TUBE CHANGE  06/06/2022   POLYPECTOMY     ULNAR NERVE REPAIR Left     Current Medications: Current Meds  Medication Sig   ACCU-CHEK GUIDE test strip    acetaminophen (TYLENOL) 500 MG tablet Take 500-1,000 mg by mouth every 8 (eight) hours as needed for moderate pain or headache.   albuterol (VENTOLIN HFA) 108 (90 Base) MCG/ACT inhaler Inhale 2 puffs into the lungs every 6 (six) hours as needed for wheezing or shortness of breath.   allopurinol (ZYLOPRIM) 100 MG tablet Take 100 mg by mouth in the morning.   Ascorbic  Acid (VITAMIN C) 1000 MG tablet Take 1,000 mg by mouth daily.   aspirin EC 81 MG tablet Take 81 mg by mouth daily.   carvedilol (COREG) 25 MG tablet Take 25 mg by mouth 2 (two) times daily.   cloNIDine (CATAPRES) 0.2 MG tablet Take 0.2 mg by mouth 2 (two) times daily.   finasteride (PROSCAR) 5 MG tablet Take 5 mg by mouth daily.   furosemide (LASIX) 40 MG tablet Take 40 mg by mouth every Monday, Wednesday, and Friday.   hydrALAZINE (APRESOLINE) 25 MG tablet Take 37.5 mg by mouth in the morning, at noon, and at bedtime.   isosorbide dinitrate (ISORDIL) 10 MG tablet Take 10 mg by mouth 3 (three) times daily.   lovastatin (MEVACOR) 40 MG tablet Take  40 mg by mouth every evening.   Magnesium Glycinate 665 MG CAPS Take 665 mg by mouth daily.   Magnesium Glycinate 665 MG CAPS Take by mouth.   metFORMIN (GLUCOPHAGE) 500 MG tablet Take 1,000 mg by mouth 2 (two) times daily.   metFORMIN (GLUCOPHAGE) 500 MG tablet Take by mouth.   nystatin (MYCOSTATIN/NYSTOP) powder Apply topically daily.   omeprazole (PRILOSEC) 20 MG capsule Take 20 mg by mouth in the morning and at bedtime.   oxaprozin (DAYPRO) 600 MG tablet Take 1,200 mg by mouth daily as needed (Gout).   potassium chloride SA (KLOR-CON M15) 15 MEQ tablet Take 15 mEq by mouth every Monday, Wednesday, and Friday.   SLOW RELEASE IRON 45 MG TBCR Take 45 mg by mouth every evening.   spironolactone (ALDACTONE) 25 MG tablet Take 25 mg by mouth every Monday, Wednesday, and Friday.   tamsulosin (FLOMAX) 0.4 MG CAPS capsule Take 0.4 mg by mouth in the morning and at bedtime.   timolol (TIMOPTIC) 0.5 % ophthalmic solution Place 1 drop into both eyes daily.   TRADJENTA 5 MG TABS tablet Take 5 mg by mouth daily.   zinc gluconate 50 MG tablet Take 50 mg by mouth daily.     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  Tobacco Use   Smoking status: Former    Types: Cigarettes    Passive exposure: Never   Smokeless tobacco: Never  Vaping Use   Vaping Use: Never used  Substance and Sexual Activity   Alcohol use: Yes    Comment: RARELY   Drug use: Never   Sexual activity: Not Currently  Other Topics Concern   Not on file  Social History Narrative   Not on file   Social Determinants of Health   Financial Resource Strain: Not on file  Food Insecurity: Not on file   Transportation Needs: Not on file  Physical Activity: Not on file  Stress: Not on file  Social Connections: Not on file     Family History: The patient's family history includes Colon cancer in his father; Diabetes in his mother; Healthy in his sister; Hyperlipidemia in his mother; Hypertension in his mother; Stroke in his mother. ROS:   Please see the history of present illness.    All other systems reviewed and are negative.  EKGs/Labs/Other Studies Reviewed:    The following studies were reviewed today:  His EKG 01/27/2022 shows atrial fibrillation rate controlled  An echocardiogram performed 11/11/2021 [facility] left ventricle is normal in size mild concentric LVH ejection fraction 55 to 60%.  The right ventricle normal in size and function with mildly elevated pulmonary artery systolic pressure 40 mmHg left atrium is moderately dilated right atrium mildly dilated there is trivial aortic and mild mitral regurgitation.    1. Left ventricular ejection fraction, by estimation, is 55 to 60%. The  left ventricle has normal function. The left ventricle has no regional  wall motion abnormalities. There is mild left ventricular hypertrophy.  Left ventricular diastolic parameters  were normal.   2. Right ventricular systolic function is normal. The right ventricular  size is normal. There is mildly elevated pulmonary artery systolic  pressure. The estimated right ventricular systolic pressure is 79.0 mmHg.   3. Left atrial size was moderately dilated.   4. Right atrial size was mildly dilated.   5. The mitral valve is normal in structure. Mild mitral valve  regurgitation. No evidence of mitral stenosis.   6. The aortic valve is tricuspid. Aortic valve regurgitation is trivial.  Aortic valve sclerosis/calcification is present, without any evidence of  aortic stenosis.   7. The inferior vena cava is dilated in size with <50% respiratory  variability, suggesting right atrial  pressure of 15 mmHg.    Recent Labs: 01/12/2022: NT-Pro BNP 1,979 01/26/2022: B Natriuretic Peptide 353.7 01/27/2022: Magnesium 1.9 07/07/2022: ALT 20; BUN 24; Creatinine 1.0; Hemoglobin 11.8; Platelets 182; Potassium 4.2; Sodium 139   Recent labs [facility] PCP 04/07/2022 magnesium 1.1 sodium 141 potassium 4.8 creatinine 1.15 GFR 68 cc/min hemoglobin 11.1  Physical Exam:    VS:  BP (!) 169/87   Pulse 72   Ht '6\' 1"'$  (1.854 m)   SpO2 99%   BMI 30.08 kg/m     Wt Readings from Last 3 Encounters:  06/06/22 228 lb (103.4 kg)  04/28/22 228 lb (103.4 kg)  04/20/22 227 lb 1.9 oz (103 kg)     GEN: Examined in a wheelchair no pallor of the skin or membranes well nourished, well developed in no acute distress HEENT: Normal NECK: No JVD; No carotid bruits LYMPHATICS: No lymphadenopathy CARDIAC: Irregular rate and rhythm RRR, no murmurs, rubs, gallops RESPIRATORY:  Clear to auscultation without rales, wheezing or rhonchi  ABDOMEN: Soft, non-tender, non-distended MUSCULOSKELETAL: 1+ bilateral lower extremity ankle to knee edema; No deformity  SKIN: Warm and dry NEUROLOGIC:  Alert and oriented x 3 PSYCHIATRIC:  Normal affect    Signed, Shirlee More, MD  07/29/2022 4:38 PM    [facility]

## 2022-07-29 NOTE — Patient Instructions (Signed)
Medication Instructions:  Your physician recommends that you continue on your current medications as directed. Please refer to the Current Medication list given to you today.  *If you need a refill on your cardiac medications before your next appointment, please call your pharmacy*   Lab Work: None If you have labs (blood work) drawn today and your tests are completely normal, you will receive your results only by: MyChart Message (if you have MyChart) OR A paper copy in the mail If you have any lab test that is abnormal or we need to change your treatment, we will call you to review the results.   Testing/Procedures: None   Follow-Up: At [facility], you and your health needs are our priority.  As part of our continuing mission to provide you with exceptional heart care, we have created designated Provider Care Teams.  These Care Teams include your primary Cardiologist (physician) and Advanced Practice Providers (APPs -  Physician Assistants and Nurse Practitioners) who all work together to provide you with the care you need, when you need it.  We recommend signing up for the patient portal called "MyChart".  Sign up information is provided on this After Visit Summary.  MyChart is used to connect with patients for Virtual Visits (Telemedicine).  Patients are able to view lab/test results, encounter notes, upcoming appointments, etc.  Non-urgent messages can be sent to your provider as well.   To learn more about what you can do with MyChart, go to [email or web address].    Your next appointment:   6 month(s)  The format for your next appointment:   In Person  Provider:   Brian Munley, MD    Other Instructions None  Important Information About Sugar       

## 2022-08-17 ENCOUNTER — Inpatient Hospital Stay: Payer: Medicare PPO | Attending: Oncology

## 2022-08-17 VITALS — BP 116/69 | HR 95 | Temp 97.4°F | Resp 18

## 2022-08-17 DIAGNOSIS — D500 Iron deficiency anemia secondary to blood loss (chronic): Secondary | ICD-10-CM

## 2022-08-17 DIAGNOSIS — E538 Deficiency of other specified B group vitamins: Secondary | ICD-10-CM | POA: Insufficient documentation

## 2022-08-17 DIAGNOSIS — D509 Iron deficiency anemia, unspecified: Secondary | ICD-10-CM | POA: Diagnosis present

## 2022-08-17 MED ORDER — CYANOCOBALAMIN 1000 MCG/ML IJ SOLN
1000.0000 ug | Freq: Once | INTRAMUSCULAR | Status: AC
  Administered 2022-08-17: 1000 ug via INTRAMUSCULAR
  Filled 2022-08-17: qty 1

## 2022-08-17 NOTE — Patient Instructions (Signed)
Vitamin B12 Injection What is this medication? Vitamin B12 (VAHY tuh min B12) prevents and treats low vitamin B12 levels in your body. It is used in people who do not get enough vitamin B12 from their diet or when their digestive tract does not absorb enough. Vitamin B12 plays an important role in maintaining the health of your nervous system and red blood cells. This medicine may be used for other purposes; ask your health care provider or pharmacist if you have questions. COMMON BRAND NAME(S): B-12 Compliance Kit, B-12 Injection Kit, Cyomin, Dodex, LA-12, Nutri-Twelve, Physicians EZ Use B-12, Primabalt What should I tell my care team before I take this medication? They need to know if you have any of these conditions: Kidney disease Leber's disease Megaloblastic anemia An unusual or allergic reaction to cyanocobalamin, cobalt, other medications, foods, dyes, or preservatives Pregnant or trying to get pregnant Breast-feeding How should I use this medication? This medication is injected into a muscle or deeply under the skin. It is usually given in a clinic or care team's office. However, your care team may teach you how to inject yourself. Follow all instructions. Talk to your care team about the use of this medication in children. Special care may be needed. Overdosage: If you think you have taken too much of this medicine contact a poison control center or emergency room at once. NOTE: This medicine is only for you. Do not share this medicine with others. What if I miss a dose? If you are given your dose at a clinic or care team's office, call to reschedule your appointment. If you give your own injections, and you miss a dose, take it as soon as you can. If it is almost time for your next dose, take only that dose. Do not take double or extra doses. What may interact with this medication? Alcohol Colchicine This list may not describe all possible interactions. Give your health care  provider a list of all the medicines, herbs, non-prescription drugs, or dietary supplements you use. Also tell them if you smoke, drink alcohol, or use illegal drugs. Some items may interact with your medicine. What should I watch for while using this medication? Visit your care team regularly. You may need blood work done while you are taking this medication. You may need to follow a special diet. Talk to your care team. Limit your alcohol intake and avoid smoking to get the best benefit. What side effects may I notice from receiving this medication? Side effects that you should report to your care team as soon as possible: Allergic reactions--skin rash, itching, hives, swelling of the face, lips, tongue, or throat Swelling of the ankles, hands, or feet Trouble breathing Side effects that usually do not require medical attention (report to your care team if they continue or are bothersome): Diarrhea This list may not describe all possible side effects. Call your doctor for medical advice about side effects. You may report side effects to FDA at 1-800-FDA-1088. Where should I keep my medication? Keep out of the reach of children. Store at room temperature between 15 and 30 degrees C (59 and 85 degrees F). Protect from light. Throw away any unused medication after the expiration date. NOTE: This sheet is a summary. It may not cover all possible information. If you have questions about this medicine, talk to your doctor, pharmacist, or health care provider.  2023 Elsevier/Gold Standard (2020-12-24 00:00:00)

## 2022-09-14 ENCOUNTER — Inpatient Hospital Stay: Payer: Medicare PPO | Attending: Oncology

## 2022-09-14 VITALS — BP 145/82 | HR 76 | Temp 98.1°F | Resp 18 | Ht 73.0 in | Wt 243.1 lb

## 2022-09-14 DIAGNOSIS — E538 Deficiency of other specified B group vitamins: Secondary | ICD-10-CM | POA: Insufficient documentation

## 2022-09-14 DIAGNOSIS — D500 Iron deficiency anemia secondary to blood loss (chronic): Secondary | ICD-10-CM

## 2022-09-14 DIAGNOSIS — D509 Iron deficiency anemia, unspecified: Secondary | ICD-10-CM | POA: Insufficient documentation

## 2022-09-14 MED ORDER — CYANOCOBALAMIN 1000 MCG/ML IJ SOLN
1000.0000 ug | Freq: Once | INTRAMUSCULAR | Status: AC
  Administered 2022-09-14: 1000 ug via INTRAMUSCULAR
  Filled 2022-09-14: qty 1

## 2022-09-14 NOTE — Patient Instructions (Signed)
Vitamin B12 Deficiency Vitamin B12 deficiency occurs when the body does not have enough of this important vitamin. The body needs this vitamin: To make red blood cells. To make DNA. This is the genetic material inside cells. To help the nerves work properly so they can carry messages from the brain to the body. Vitamin B12 deficiency can cause health problems, such as not having enough red blood cells in the blood (anemia). This can lead to nerve damage if untreated. What are the causes? This condition may be caused by: Not eating enough foods that contain vitamin B12. Not having enough stomach acid and digestive fluids to properly absorb vitamin B12 from the food that you eat. Having certain diseases that make it hard to absorb vitamin B12. These diseases include Crohn's disease, chronic pancreatitis, and cystic fibrosis. An autoimmune disorder in which the body does not make enough of a protein (intrinsic factor) within the stomach, resulting in not enough absorption of vitamin B12. Having a surgery in which part of the stomach or small intestine is removed. Taking certain medicines that make it hard for the body to absorb vitamin B12. These include: Heartburn medicines, such as antacids and proton pump inhibitors. Some medicines that are used to treat diabetes. What increases the risk? The following factors may make you more likely to develop a vitamin B12 deficiency: Being an older adult. Eating a vegetarian or vegan diet that does not include any foods that come from animals. Eating a poor diet while you are pregnant. Taking certain medicines. Having alcoholism. What are the signs or symptoms? In some cases, there are no symptoms of this condition. If the condition leads to anemia or nerve damage, various symptoms may occur, such as: Weakness. Tiredness (fatigue). Loss of appetite. Numbness or tingling in your hands and feet. Redness and burning of the tongue. Depression,  confusion, or memory problems. Trouble walking. If anemia is severe, symptoms can include: Shortness of breath. Dizziness. Rapid heart rate. How is this diagnosed? This condition may be diagnosed with a blood test to measure the level of vitamin B12 in your blood. You may also have other tests, including: A group of tests that measure certain characteristics of blood cells (complete blood count, CBC). A blood test to measure intrinsic factor. A procedure where a thin tube with a camera on the end is used to look into your stomach or intestines (endoscopy). Other tests may be needed to discover the cause of the deficiency. How is this treated? Treatment for this condition depends on the cause. This condition may be treated by: Changing your eating and drinking habits, such as: Eating more foods that contain vitamin B12. Drinking less alcohol or no alcohol. Getting vitamin B12 injections. Taking vitamin B12 supplements by mouth (orally). Your health care provider will tell you which dose is best for you. Follow these instructions at home: Eating and drinking  Include foods in your diet that come from animals and contain a lot of vitamin B12. These include: Meats and poultry. This includes beef, pork, chicken, turkey, and organ meats, such as liver. Seafood. This includes clams, rainbow trout, salmon, tuna, and haddock. Eggs. Dairy foods such as milk, yogurt, and cheese. Eat foods that have vitamin B12 added to them (are fortified), such as ready-to-eat breakfast cereals. Check the label on the package to see if a food is fortified. The items listed above may not be a complete list of foods and beverages you can eat and drink. Contact a dietitian for   more information. Alcohol use Do not drink alcohol if: Your health care provider tells you not to drink. You are pregnant, may be pregnant, or are planning to become pregnant. If you drink alcohol: Limit how much you have to: 0-1 drink a  day for women. 0-2 drinks a day for men. Know how much alcohol is in your drink. In the [place] one drink equals one 12 oz bottle of beer (355 mL), one 5 oz glass of wine (148 mL), or one 1 oz glass of hard liquor (44 mL). General instructions Get vitamin B12 injections if told to by your health care provider. Take supplements only as told by your health care provider. Follow the directions carefully. Keep all follow-up visits. This is important. Contact a health care provider if: Your symptoms come back. Your symptoms get worse or do not improve with treatment. Get help right away: You develop shortness of breath. You have a rapid heart rate. You have chest pain. You become dizzy or you faint. These symptoms may be an emergency. Get help right away. Call 911. Do not wait to see if the symptoms will go away. Do not drive yourself to the hospital. Summary Vitamin B12 deficiency occurs when the body does not have enough of this important vitamin. Common causes include not eating enough foods that contain vitamin B12, not being able to absorb vitamin B12 from the food that you eat, having a surgery in which part of the stomach or small intestine is removed, or taking certain medicines. Eat foods that have vitamin B12 in them. Treatment may include making a change in the way you eat and drink, getting vitamin B12 injections, or taking vitamin B12 supplements. This information is not intended to replace advice given to you by your health care provider. Make sure you discuss any questions you have with your health care provider. Document Revised: 06/11/2021 Document Reviewed: 06/11/2021 Elsevier Patient Education  2023 [employer]  

## 2022-10-12 ENCOUNTER — Inpatient Hospital Stay: Payer: Medicare PPO | Attending: Oncology

## 2022-10-12 DIAGNOSIS — E538 Deficiency of other specified B group vitamins: Secondary | ICD-10-CM | POA: Insufficient documentation

## 2022-10-12 DIAGNOSIS — D509 Iron deficiency anemia, unspecified: Secondary | ICD-10-CM | POA: Diagnosis present

## 2022-10-12 DIAGNOSIS — D500 Iron deficiency anemia secondary to blood loss (chronic): Secondary | ICD-10-CM

## 2022-10-12 MED ORDER — CYANOCOBALAMIN 1000 MCG/ML IJ SOLN
1000.0000 ug | Freq: Once | INTRAMUSCULAR | Status: AC
  Administered 2022-10-12: 1000 ug via INTRAMUSCULAR
  Filled 2022-10-12: qty 1

## 2022-10-12 NOTE — Patient Instructions (Signed)
Vitamin B12 Deficiency Vitamin B12 deficiency occurs when the body does not have enough of this important vitamin. The body needs this vitamin: To make red blood cells. To make DNA. This is the genetic material inside cells. To help the nerves work properly so they can carry messages from the brain to the body. Vitamin B12 deficiency can cause health problems, such as not having enough red blood cells in the blood (anemia). This can lead to nerve damage if untreated. What are the causes? This condition may be caused by: Not eating enough foods that contain vitamin B12. Not having enough stomach acid and digestive fluids to properly absorb vitamin B12 from the food that you eat. Having certain diseases that make it hard to absorb vitamin B12. These diseases include Crohn's disease, chronic pancreatitis, and cystic fibrosis. An autoimmune disorder in which the body does not make enough of a protein (intrinsic factor) within the stomach, resulting in not enough absorption of vitamin B12. Having a surgery in which part of the stomach or small intestine is removed. Taking certain medicines that make it hard for the body to absorb vitamin B12. These include: Heartburn medicines, such as antacids and proton pump inhibitors. Some medicines that are used to treat diabetes. What increases the risk? The following factors may make you more likely to develop a vitamin B12 deficiency: Being an older adult. Eating a vegetarian or vegan diet that does not include any foods that come from animals. Eating a poor diet while you are pregnant. Taking certain medicines. Having alcoholism. What are the signs or symptoms? In some cases, there are no symptoms of this condition. If the condition leads to anemia or nerve damage, various symptoms may occur, such as: Weakness. Tiredness (fatigue). Loss of appetite. Numbness or tingling in your hands and feet. Redness and burning of the tongue. Depression,  confusion, or memory problems. Trouble walking. If anemia is severe, symptoms can include: Shortness of breath. Dizziness. Rapid heart rate. How is this diagnosed? This condition may be diagnosed with a blood test to measure the level of vitamin B12 in your blood. You may also have other tests, including: A group of tests that measure certain characteristics of blood cells (complete blood count, CBC). A blood test to measure intrinsic factor. A procedure where a thin tube with a camera on the end is used to look into your stomach or intestines (endoscopy). Other tests may be needed to discover the cause of the deficiency. How is this treated? Treatment for this condition depends on the cause. This condition may be treated by: Changing your eating and drinking habits, such as: Eating more foods that contain vitamin B12. Drinking less alcohol or no alcohol. Getting vitamin B12 injections. Taking vitamin B12 supplements by mouth (orally). Your health care provider will tell you which dose is best for you. Follow these instructions at home: Eating and drinking  Include foods in your diet that come from animals and contain a lot of vitamin B12. These include: Meats and poultry. This includes beef, pork, chicken, turkey, and organ meats, such as liver. Seafood. This includes clams, rainbow trout, salmon, tuna, and haddock. Eggs. Dairy foods such as milk, yogurt, and cheese. Eat foods that have vitamin B12 added to them (are fortified), such as ready-to-eat breakfast cereals. Check the label on the package to see if a food is fortified. The items listed above may not be a complete list of foods and beverages you can eat and drink. Contact a dietitian for   more information. Alcohol use Do not drink alcohol if: Your health care provider tells you not to drink. You are pregnant, may be pregnant, or are planning to become pregnant. If you drink alcohol: Limit how much you have to: 0-1 drink a  day for women. 0-2 drinks a day for men. Know how much alcohol is in your drink. In the [place] one drink equals one 12 oz bottle of beer (355 mL), one 5 oz glass of wine (148 mL), or one 1 oz glass of hard liquor (44 mL). General instructions Get vitamin B12 injections if told to by your health care provider. Take supplements only as told by your health care provider. Follow the directions carefully. Keep all follow-up visits. This is important. Contact a health care provider if: Your symptoms come back. Your symptoms get worse or do not improve with treatment. Get help right away: You develop shortness of breath. You have a rapid heart rate. You have chest pain. You become dizzy or you faint. These symptoms may be an emergency. Get help right away. Call 911. Do not wait to see if the symptoms will go away. Do not drive yourself to the hospital. Summary Vitamin B12 deficiency occurs when the body does not have enough of this important vitamin. Common causes include not eating enough foods that contain vitamin B12, not being able to absorb vitamin B12 from the food that you eat, having a surgery in which part of the stomach or small intestine is removed, or taking certain medicines. Eat foods that have vitamin B12 in them. Treatment may include making a change in the way you eat and drink, getting vitamin B12 injections, or taking vitamin B12 supplements. This information is not intended to replace advice given to you by your health care provider. Make sure you discuss any questions you have with your health care provider. Document Revised: 06/11/2021 Document Reviewed: 06/11/2021 Elsevier Patient Education  2023 [employer]  

## 2022-11-04 ENCOUNTER — Other Ambulatory Visit: Payer: Medicare PPO

## 2022-11-04 ENCOUNTER — Encounter: Payer: Self-pay | Admitting: Oncology

## 2022-11-07 ENCOUNTER — Ambulatory Visit: Payer: Medicare PPO | Admitting: Oncology

## 2022-11-07 ENCOUNTER — Inpatient Hospital Stay: Payer: Medicare PPO

## 2022-11-07 ENCOUNTER — Other Ambulatory Visit: Payer: Medicare PPO

## 2022-11-09 ENCOUNTER — Ambulatory Visit: Payer: Medicare PPO

## 2022-11-11 ENCOUNTER — Encounter: Payer: Self-pay | Admitting: Oncology

## 2022-11-14 ENCOUNTER — Inpatient Hospital Stay: Payer: Medicare PPO

## 2022-11-14 ENCOUNTER — Other Ambulatory Visit: Payer: Self-pay | Admitting: Oncology

## 2022-11-14 ENCOUNTER — Inpatient Hospital Stay: Payer: Medicare PPO | Attending: Oncology

## 2022-11-14 ENCOUNTER — Inpatient Hospital Stay: Payer: Medicare PPO | Admitting: Oncology

## 2022-11-14 VITALS — BP 156/85 | HR 86 | Temp 98.4°F | Resp 18 | Ht 73.0 in

## 2022-11-14 VITALS — BP 167/90 | HR 77 | Resp 18 | Wt 245.6 lb

## 2022-11-14 DIAGNOSIS — D500 Iron deficiency anemia secondary to blood loss (chronic): Secondary | ICD-10-CM

## 2022-11-14 DIAGNOSIS — D509 Iron deficiency anemia, unspecified: Secondary | ICD-10-CM | POA: Diagnosis present

## 2022-11-14 DIAGNOSIS — E538 Deficiency of other specified B group vitamins: Secondary | ICD-10-CM

## 2022-11-14 LAB — IRON AND TIBC
Iron: 62 ug/dL (ref 45–182)
Saturation Ratios: 19 % (ref 17.9–39.5)
TIBC: 328 ug/dL (ref 250–450)
UIBC: 266 ug/dL

## 2022-11-14 LAB — CMP (CANCER CENTER ONLY)
ALT: 23 U/L (ref 0–44)
AST: 24 U/L (ref 15–41)
Albumin: 3.8 g/dL (ref 3.5–5.0)
Alkaline Phosphatase: 40 U/L (ref 38–126)
Anion gap: 12 (ref 5–15)
BUN: 25 mg/dL — ABNORMAL HIGH (ref 8–23)
CO2: 26 mmol/L (ref 22–32)
Calcium: 9.2 mg/dL (ref 8.9–10.3)
Chloride: 103 mmol/L (ref 98–111)
Creatinine: 1.22 mg/dL (ref 0.61–1.24)
GFR, Estimated: >60 mL/min (ref >60)
Glucose, Bld: 127 mg/dL — ABNORMAL HIGH (ref 70–99)
Potassium: 4.1 mmol/L (ref 3.5–5.1)
Sodium: 141 mmol/L (ref 135–145)
Total Bilirubin: 0.5 mg/dL (ref 0.3–1.2)
Total Protein: 6.9 g/dL (ref 6.5–8.1)

## 2022-11-14 LAB — FOLATE: Folate: 13.3 ng/mL (ref >5.9)

## 2022-11-14 LAB — FERRITIN: Ferritin: 69 ng/mL (ref 24–336)

## 2022-11-14 LAB — CBC AND DIFFERENTIAL
HCT: 39 — AB (ref 41–53)
Hemoglobin: 13 — AB (ref 13.5–17.5)
Neutrophils Absolute: 7.88
Platelets: 194 10*3/uL (ref 150–400)
WBC: 10.8

## 2022-11-14 LAB — CBC: RBC: 4.14 (ref 3.87–5.11)

## 2022-11-14 LAB — VITAMIN B12: Vitamin B-12: 322 pg/mL (ref 180–914)

## 2022-11-14 MED ORDER — CYANOCOBALAMIN 1000 MCG/ML IJ SOLN
1000.0000 ug | Freq: Once | INTRAMUSCULAR | Status: AC
  Administered 2022-11-14: 1000 ug via INTRAMUSCULAR
  Filled 2022-11-14: qty 1

## 2022-11-14 NOTE — Patient Instructions (Signed)

## 2022-11-14 NOTE — Progress Notes (Signed)
. [place]  [address] [place],  [place]  [address] [phone]  Clinic Day:  11/14/2022  Referring physician: Maris Berger, MD  HISTORY OF PRESENT ILLNESS:  The patient is a 72 y.o. 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 He received IV iron, which was effective in bolstering his iron stores and improving his hemoglobin.   He continues to take monthly B12 injections to fortify his cobalamin stores.  He comes in today to reassess his anemia.  Since his last visit, the patient has been doing okay.  He denies having increased fatigue or any overt forms of blood loss which concern him for progressive anemia.  PHYSICAL EXAM:  Blood pressure (!) 156/85, pulse 86, temperature 98.4 F (36.9 C), resp. rate 18, height '6\' 1"'$  (1.854 m), SpO2 94 %. Wt Readings from Last 3 Encounters:  11/14/22 245 lb 9.6 oz (111.4 kg)  09/14/22 243 lb 1.6 oz (110.3 kg)  06/06/22 228 lb (103.4 kg)   Body mass index is 32.07 kg/m. Performance status (ECOG): 2 - Symptomatic, <50% confined to bed Physical Exam Constitutional:      Appearance: Normal appearance. He is not ill-appearing.     Comments: A chronically ill-appearing gentleman who is in a wheelchair.  A Foley catheter is in place  HENT:     Mouth/Throat:     Mouth: Mucous membranes are moist.     Pharynx: Oropharynx is clear. No oropharyngeal exudate or posterior oropharyngeal erythema.  Cardiovascular:     Rate and Rhythm: Normal rate and regular rhythm.     Heart sounds: No murmur heard.    No friction rub. No gallop.  Pulmonary:     Effort: Pulmonary effort is normal. No respiratory distress.     Breath sounds: Normal breath sounds. No wheezing, rhonchi or rales.  Abdominal:     General: Bowel sounds are normal. There is no distension.     Palpations: Abdomen is soft. There is no mass.     Tenderness: There is no abdominal tenderness.  Musculoskeletal:         General: No swelling.     Right lower leg: No edema.     Left lower leg: No edema.  Lymphadenopathy:     Cervical: No cervical adenopathy.     Upper Body:     Right upper body: No supraclavicular or axillary adenopathy.     Left upper body: No supraclavicular or axillary adenopathy.     Lower Body: No right inguinal adenopathy. No left inguinal adenopathy.  Skin:    General: Skin is warm.     Coloration: Skin is not jaundiced.     Findings: No lesion or rash.  Neurological:     General: No focal deficit present.     Mental Status: He is alert and oriented to person, place, and time. Mental status is at baseline.  Psychiatric:        Mood and Affect: Mood normal.        Behavior: Behavior normal.        Thought Content: Thought content normal.    LABS:    Latest Reference Range & Units 11/14/22 13:17  Sodium 135 - 145 mmol/L 141  Potassium 3.5 - 5.1 mmol/L 4.1  Chloride 98 - 111 mmol/L 103  CO2 22 - 32 mmol/L 26  Glucose 70 - 99 mg/dL 127 (H)  BUN 8 - 23 mg/dL 25 (H)  Creatinine 0.61 - 1.24 mg/dL 1.22  Calcium 8.9 - 10.3 mg/dL 9.2  Anion gap 5 - 15  12  Alkaline Phosphatase 38 - 126 U/L 40  Albumin 3.5 - 5.0 g/dL 3.8  AST 15 - 41 U/L 24  ALT 0 - 44 U/L 23  Total Protein 6.5 - 8.1 g/dL 6.9  Total Bilirubin 0.3 - 1.2 mg/dL 0.5  GFR, Est Non African American >60 mL/min >60  (H): Data is abnormally high  Latest Reference Range & Units 11/14/22 13:17  Iron 45 - 182 ug/dL 62  UIBC ug/dL 266  TIBC 250 - 450 ug/dL 328  Saturation Ratios 17.9 - 39.5 % 19  Ferritin 24 - 336 ng/mL 69  Folate >5.9 ng/mL 13.3  Vitamin B12 180 - 914 pg/mL 322   ASSESSMENT & PLAN:  A 72 y.o. male with anemia secondary to both iron deficiency and vitamin B12 deficiency.  His hemoglobin of 13 today is the highest it has been in numerous years.  Understandably, the patient was pleased with this level.  He knows to continue taking his B12 injections on a monthly basis to ensure adequate fortification  of his cobalamin stores.  His iron parameters remain fine.  As he is doing well from a hematologic standpoint, I will see him back in 6 months for repeat clinical assessment. The patient understands all the plans discussed today and is in agreement with them.  Laruth Hanger Macarthur Critchley, MD

## 2022-11-17 ENCOUNTER — Telehealth: Payer: Self-pay

## 2022-11-17 NOTE — Telephone Encounter (Signed)
  Latest Reference Range & Units 11/14/22 13:17   Iron 45 - 182 ug/dL 62  UIBC ug/dL 266  TIBC 250 - 450 ug/dL 328  Saturation Ratios 17.9 - 39.5 % 19  Ferritin 24 - 336 ng/mL 69  Folate >5.9 ng/mL 13.3  Vitamin B12 180 - 914 pg/mL 322    Latest Reference Range & Units 11/14/22 13:17  Sodium 135 - 145 mmol/L 141  Potassium 3.5 - 5.1 mmol/L 4.1  Chloride 98 - 111 mmol/L 103  CO2 22 - 32 mmol/L 26  Glucose 70 - 99 mg/dL 127 (H)  BUN 8 - 23 mg/dL 25 (H)  Creatinine 0.61 - 1.24 mg/dL 1.22  Calcium 8.9 - 10.3 mg/dL 9.2  (H): Data is abnormally high   ASSESSMENT & PLAN:  A 72 y.o. male with anemia secondary to both iron deficiency and vitamin B12 deficiency.  His hemoglobin of 13 today is the highest it has been in numerous years.  Understandably, the patient was pleased with this level.  He knows to continue taking his B12 injections on a monthly basis to ensure adequate fortification of his cobalamin stores.  His iron parameters remain fine.  As he is doing well from a hematologic standpoint, I will see him back in 6 months for repeat clinical assessment. The patient understands all the plans discussed today and is in agreement with them.   Marice Potter, MD                Electronically signed by Marice Potter, MD at 11/14/2022  7:11 PM

## 2022-12-08 DIAGNOSIS — Z9359 Other cystostomy status: Secondary | ICD-10-CM | POA: Insufficient documentation

## 2022-12-08 DIAGNOSIS — Z960 Presence of urogenital implants: Secondary | ICD-10-CM | POA: Insufficient documentation

## 2022-12-08 HISTORY — DX: Other cystostomy status: Z93.59

## 2022-12-09 ENCOUNTER — Encounter: Payer: Self-pay | Admitting: Oncology

## 2022-12-12 ENCOUNTER — Inpatient Hospital Stay: Payer: Medicare PPO

## 2022-12-14 ENCOUNTER — Inpatient Hospital Stay: Payer: Medicare PPO | Attending: Oncology

## 2022-12-14 VITALS — BP 111/66 | HR 63 | Temp 97.9°F | Resp 18 | Ht 73.0 in | Wt 245.0 lb

## 2022-12-14 DIAGNOSIS — D509 Iron deficiency anemia, unspecified: Secondary | ICD-10-CM | POA: Diagnosis present

## 2022-12-14 DIAGNOSIS — D500 Iron deficiency anemia secondary to blood loss (chronic): Secondary | ICD-10-CM

## 2022-12-14 DIAGNOSIS — E538 Deficiency of other specified B group vitamins: Secondary | ICD-10-CM | POA: Diagnosis present

## 2022-12-14 MED ORDER — CYANOCOBALAMIN 1000 MCG/ML IJ SOLN
1000.0000 ug | Freq: Once | INTRAMUSCULAR | Status: AC
  Administered 2022-12-14: 1000 ug via INTRAMUSCULAR
  Filled 2022-12-14: qty 1

## 2023-01-09 ENCOUNTER — Ambulatory Visit: Payer: Medicare PPO

## 2023-01-11 ENCOUNTER — Inpatient Hospital Stay: Payer: Medicare PPO | Attending: Oncology

## 2023-01-11 VITALS — BP 132/75 | HR 73 | Temp 98.1°F | Resp 18 | Ht 73.0 in

## 2023-01-11 DIAGNOSIS — D509 Iron deficiency anemia, unspecified: Secondary | ICD-10-CM | POA: Diagnosis present

## 2023-01-11 DIAGNOSIS — E538 Deficiency of other specified B group vitamins: Secondary | ICD-10-CM | POA: Insufficient documentation

## 2023-01-11 DIAGNOSIS — D500 Iron deficiency anemia secondary to blood loss (chronic): Secondary | ICD-10-CM

## 2023-01-11 MED ORDER — CYANOCOBALAMIN 1000 MCG/ML IJ SOLN
1000.0000 ug | Freq: Once | INTRAMUSCULAR | Status: AC
  Administered 2023-01-11: 1000 ug via INTRAMUSCULAR
  Filled 2023-01-11: qty 1

## 2023-01-11 NOTE — Patient Instructions (Signed)
Vitamin B12 Deficiency Vitamin B12 deficiency occurs when the body does not have enough of this important vitamin. The body needs this vitamin: To make red blood cells. To make DNA. This is the genetic material inside cells. To help the nerves work properly so they can carry messages from the brain to the body. Vitamin B12 deficiency can cause health problems, such as not having enough red blood cells in the blood (anemia). This can lead to nerve damage if untreated. What are the causes? This condition may be caused by: Not eating enough foods that contain vitamin B12. Not having enough stomach acid and digestive fluids to properly absorb vitamin B12 from the food that you eat. Having certain diseases that make it hard to absorb vitamin B12. These diseases include Crohn's disease, chronic pancreatitis, and cystic fibrosis. An autoimmune disorder in which the body does not make enough of a protein (intrinsic factor) within the stomach, resulting in not enough absorption of vitamin B12. Having a surgery in which part of the stomach or small intestine is removed. Taking certain medicines that make it hard for the body to absorb vitamin B12. These include: Heartburn medicines, such as antacids and proton pump inhibitors. Some medicines that are used to treat diabetes. What increases the risk? The following factors may make you more likely to develop a vitamin B12 deficiency: Being an older adult. Eating a vegetarian or vegan diet that does not include any foods that come from animals. Eating a poor diet while you are pregnant. Taking certain medicines. Having alcoholism. What are the signs or symptoms? In some cases, there are no symptoms of this condition. If the condition leads to anemia or nerve damage, various symptoms may occur, such as: Weakness. Tiredness (fatigue). Loss of appetite. Numbness or tingling in your hands and feet. Redness and burning of the tongue. Depression,  confusion, or memory problems. Trouble walking. If anemia is severe, symptoms can include: Shortness of breath. Dizziness. Rapid heart rate. How is this diagnosed? This condition may be diagnosed with a blood test to measure the level of vitamin B12 in your blood. You may also have other tests, including: A group of tests that measure certain characteristics of blood cells (complete blood count, CBC). A blood test to measure intrinsic factor. A procedure where a thin tube with a camera on the end is used to look into your stomach or intestines (endoscopy). Other tests may be needed to discover the cause of the deficiency. How is this treated? Treatment for this condition depends on the cause. This condition may be treated by: Changing your eating and drinking habits, such as: Eating more foods that contain vitamin B12. Drinking less alcohol or no alcohol. Getting vitamin B12 injections. Taking vitamin B12 supplements by mouth (orally). Your health care provider will tell you which dose is best for you. Follow these instructions at home: Eating and drinking  Include foods in your diet that come from animals and contain a lot of vitamin B12. These include: Meats and poultry. This includes beef, pork, chicken, turkey, and organ meats, such as liver. Seafood. This includes clams, rainbow trout, salmon, tuna, and haddock. Eggs. Dairy foods such as milk, yogurt, and cheese. Eat foods that have vitamin B12 added to them (are fortified), such as ready-to-eat breakfast cereals. Check the label on the package to see if a food is fortified. The items listed above may not be a complete list of foods and beverages you can eat and drink. Contact a dietitian for   more information. Alcohol use Do not drink alcohol if: Your health care provider tells you not to drink. You are pregnant, may be pregnant, or are planning to become pregnant. If you drink alcohol: Limit how much you have to: 0-1 drink a  day for women. 0-2 drinks a day for men. Know how much alcohol is in your drink. In the [place] one drink equals one 12 oz bottle of beer (355 mL), one 5 oz glass of wine (148 mL), or one 1 oz glass of hard liquor (44 mL). General instructions Get vitamin B12 injections if told to by your health care provider. Take supplements only as told by your health care provider. Follow the directions carefully. Keep all follow-up visits. This is important. Contact a health care provider if: Your symptoms come back. Your symptoms get worse or do not improve with treatment. Get help right away: You develop shortness of breath. You have a rapid heart rate. You have chest pain. You become dizzy or you faint. These symptoms may be an emergency. Get help right away. Call 911. Do not wait to see if the symptoms will go away. Do not drive yourself to the hospital. Summary Vitamin B12 deficiency occurs when the body does not have enough of this important vitamin. Common causes include not eating enough foods that contain vitamin B12, not being able to absorb vitamin B12 from the food that you eat, having a surgery in which part of the stomach or small intestine is removed, or taking certain medicines. Eat foods that have vitamin B12 in them. Treatment may include making a change in the way you eat and drink, getting vitamin B12 injections, or taking vitamin B12 supplements. This information is not intended to replace advice given to you by your health care provider. Make sure you discuss any questions you have with your health care provider. Document Revised: 06/11/2021 Document Reviewed: 06/11/2021 Elsevier Patient Education  2023 [employer]  

## 2023-02-06 ENCOUNTER — Ambulatory Visit: Payer: Medicare PPO

## 2023-02-08 ENCOUNTER — Inpatient Hospital Stay: Payer: Medicare PPO | Attending: Oncology

## 2023-02-08 VITALS — BP 125/75 | HR 77 | Temp 97.5°F | Resp 20 | Ht 73.0 in | Wt 252.7 lb

## 2023-02-08 DIAGNOSIS — D500 Iron deficiency anemia secondary to blood loss (chronic): Secondary | ICD-10-CM

## 2023-02-08 DIAGNOSIS — D509 Iron deficiency anemia, unspecified: Secondary | ICD-10-CM | POA: Diagnosis present

## 2023-02-08 DIAGNOSIS — E538 Deficiency of other specified B group vitamins: Secondary | ICD-10-CM | POA: Diagnosis present

## 2023-02-08 MED ORDER — CYANOCOBALAMIN 1000 MCG/ML IJ SOLN
1000.0000 ug | Freq: Once | INTRAMUSCULAR | Status: AC
  Administered 2023-02-08: 1000 ug via INTRAMUSCULAR
  Filled 2023-02-08: qty 1

## 2023-02-08 NOTE — Patient Instructions (Signed)

## 2023-03-06 ENCOUNTER — Ambulatory Visit: Payer: Medicare PPO

## 2023-03-08 ENCOUNTER — Inpatient Hospital Stay: Payer: Medicare PPO | Attending: Oncology

## 2023-03-08 VITALS — BP 140/83 | HR 59 | Temp 97.8°F | Resp 18

## 2023-03-08 DIAGNOSIS — D509 Iron deficiency anemia, unspecified: Secondary | ICD-10-CM | POA: Insufficient documentation

## 2023-03-08 DIAGNOSIS — E538 Deficiency of other specified B group vitamins: Secondary | ICD-10-CM | POA: Insufficient documentation

## 2023-03-08 DIAGNOSIS — D500 Iron deficiency anemia secondary to blood loss (chronic): Secondary | ICD-10-CM

## 2023-03-08 MED ORDER — CYANOCOBALAMIN 1000 MCG/ML IJ SOLN
1000.0000 ug | Freq: Once | INTRAMUSCULAR | Status: AC
  Administered 2023-03-08: 1000 ug via INTRAMUSCULAR
  Filled 2023-03-08: qty 1

## 2023-03-08 NOTE — Patient Instructions (Signed)

## 2023-03-15 DIAGNOSIS — E669 Obesity, unspecified: Secondary | ICD-10-CM

## 2023-03-15 HISTORY — DX: Obesity, unspecified: E66.9

## 2023-03-23 NOTE — Progress Notes (Signed)
Cardiology Office Note:    Date:  03/24/2023   ID:  Anthony Mueller, Anthony Mueller 1951-01-31, MRN [id number]  PCP:  Everlean Cherry, MD  Cardiologist:  Norman Herrlich, MD    Referring MD: Everlean Cherry, MD    ASSESSMENT:    1. Persistent atrial fibrillation (HCC)   2. Hypertensive heart disease with chronic diastolic congestive heart failure (HCC)   3. VT (ventricular tachycardia) (HCC)   4. Iron deficiency anemia due to chronic blood loss    PLAN:    In order of problems listed above:  Radiology perspective doing well rate controlled atrial fibrillation continue beta-blocker Heart failure is well compensated on his current loop diuretic he has continued resistant hypertension and takes multiple antihypertensive agents MRA loop diuretic beta-blocker ARB in combination hydralazine and isosorbide along with clonidine.  Family request of his renal vascular duplex done by vascular lab. He continues magnesium supplements his QT interval is normal and he has no  ventricular arrhythmia on his EKG I do not think he requires repeat cardiac monitoring in place: Improved hemoglobin is normal   Next appointment: 6 months   Medication Adjustments/Labs and Tests Ordered: Current medicines are reviewed at length with the patient today.  Concerns regarding medicines are outlined above.  No orders of the defined types were placed in this encounter.  No orders of the defined types were placed in this encounter.   Chief Complaint  Patient presents with   Atrial Fibrillation    History of Present Illness:    Anthony Mueller is a 72 y.o. male with a hx of persistent atrial fibrillation no longer anticoagulated because of GI bleeding and anemia hypertensive heart disease with heart failure in the setting of severe anemia ventricular tachycardia with hypomagnesemia iron deficiency anemia cerebellar degeneration and myelopathy last seen 07/29/2022.  Labs 12/08/2022 potassium 4.1 A1c 6.6  hemoglobin 13.0 last magnesium 1.31 March 2022 from [facility]  Compliance with diet, lifestyle and medications: Yes  He has resistant hypertension seen nephrology [facility] attempted continued renal vascular duplex there were multiple issues it was not done and the patient requested with Dr. Isidore Moos we have a vascular certified lab scheduled and send a copy of this to his nephrologist Dr.Nwubo. From cardiology perspective he is doing well not having edema orthopnea shortness of breath chest: Palpitation or syncope He has rate controlled atrial fibrillation with carvedilol and the patient is not anticoagulated. Home blood pressures are quite variable but generally greater than 1 50-1 70 systolic. Past Medical History:  Diagnosis Date   Accelerated hypertension 03/31/2022   Acute diastolic (congestive) heart failure (HCC) 03/31/2022   Acute on chronic anemia 01/13/2022   AKI (acute kidney injury) (HCC) 01/13/2022   At risk for fall due to comorbid condition 01/20/2022   B12 deficiency 03/01/2022   Bilateral lower extremity edema 01/13/2022   Calculus of gallbladder with acute cholecystitis without obstruction 02/28/2022   Cardiomegaly 11/10/2021   Cerebellar degeneration (HCC)    CHF exacerbation (HCC) 03/31/2022   Cholelithiasis 11/10/2021   Chronic diastolic heart failure (HCC) 03/31/2022   Diabetes mellitus without complication (HCC)    DM type 2 with diabetic mixed hyperlipidemia (HCC) 03/04/2020   Does not transfer from wheelchair to toilet 01/20/2022   Drug therapy 02/02/2016   Elevated antinuclear antibody (ANA) level 10/06/2015   Gait disorder 09/07/2016   GERD (gastroesophageal reflux disease)    Gout 08/28/2012   History of 2019 novel coronavirus disease (COVID-19) 10/27/2021  History of colonic polyps 11/14/2011   History of esophageal stricture 05/02/2018   Hypercholesteremia 03/31/2022   Hypercoagulable state due to persistent atrial fibrillation (HCC) 12/16/2021   Hyperlipidemia     Hypertension    Hypertension, essential, benign 08/28/2012   Hypertensive heart disease 01/05/2022   Hypertensive heart disease with acute on chronic diastolic congestive heart failure (HCC) 03/31/2022   Hypertensive urgency 03/31/2022   Hypokalemia 11/10/2021   Hypomagnesemia 01/13/2022   Intertrigo 09/03/2018   Iron deficiency anemia due to chronic blood loss 03/01/2022   Long term (current) use of anticoagulants 12/16/2021   Malaise and fatigue 03/31/2022   Muscular deconditioning 12/23/2021   Occult GI bleeding 11/06/2017   OSA (obstructive sleep apnea) 01/13/2022   Formatting of this note might be different from the original. WEARS CPAP   Persistent atrial fibrillation (HCC) 11/13/2021   Formatting of this note might be different from the original. Last Assessment & Plan:  Formatting of this note might be different from the original. - Patient noted to be in atrial fibrillation, bradycardia with sinus pauses -Per cardiology, started on apixaban, will need to be stopped prior to his upcoming rectal surgery (stop 5 days prior to the surgery and then resume once cleared by surgery se   Positive ANA (antinuclear antibody) 10/06/2015   Postoperative anemia due to acute blood loss 01/20/2022   Postoperative examination 02/28/2022   Pulmonary edema 03/31/2022   Rectal bleeding 01/14/2022   RLS (restless legs syndrome) 09/08/2016   Shortness of breath 03/31/2022   Skin cancer    Transaminitis 11/10/2021   Trigger middle finger of left hand 10/07/2015   Type 2 diabetes mellitus with hyperlipidemia (HCC) 11/10/2021   Unspecified atrial fibrillation (HCC) with sinus pauses 11/13/2021    Past Surgical History:  Procedure Laterality Date   APPENDECTOMY     CATARACT EXTRACTION, BILATERAL     CHOLECYSTECTOMY     HEMORRHOID SURGERY     IR CATHETER TUBE CHANGE  06/06/2022   POLYPECTOMY     ULNAR NERVE REPAIR Left     Current Medications: No outpatient medications have been marked as taking for the 03/24/23 encounter  (Office Visit) with Baldo Daub, MD.     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  Tobacco Use   Smoking status: Former    Types: Cigarettes    Passive exposure: Never   Smokeless tobacco: Never  Vaping Use   Vaping Use: Never used  Substance and Sexual Activity   Alcohol use: Yes    Comment: RARELY   Drug use: Never   Sexual activity: Not Currently  Other Topics Concern   Not on file  Social History Narrative   Not on file   Social Determinants of Health   Financial Resource Strain: Not on file  Food Insecurity: Not on file  Transportation Needs: Not on file  Physical Activity: Not on file  Stress: Not on file  Social Connections: Not on file     Family History: The patient's family history includes Colon cancer in his father; Diabetes in his mother; Healthy in his sister; Hyperlipidemia in his mother; Hypertension in his mother; Stroke in his mother.  ROS:   Please see the history of present illness.    All other systems reviewed and are negative.  EKGs/Labs/Other Studies Reviewed:    The following studies were reviewed today:  Cardiac Studies & Procedures       ECHOCARDIOGRAM  ECHOCARDIOGRAM COMPLETE 11/11/2021  Narrative ECHOCARDIOGRAM REPORT    Patient Name:   Anthony Mueller Date of Exam: 11/11/2021 Medical Rec #:  [id number]      Height:       73.0 in Accession #:    [id number]     Weight:       250.0 lb Date of Birth:  1951-09-29      BSA:          2.366 m Patient Age:    70 years       BP:           137/81 mmHg Patient Gender: M              HR:           72 bpm. Exam Location:  Inpatient  Procedure: 2D  Echo  Indications:    Cardiomegaly  History:        Patient has no prior history of Echocardiogram examinations. Risk Factors:Diabetes.  Sonographer:    Eduard Roux Referring Phys: [id number] RONDELL A SMITH  IMPRESSIONS   1. Left ventricular ejection fraction, by estimation, is 55 to 60%. The left ventricle has normal function. The left ventricle has no regional wall motion abnormalities. There is mild left ventricular hypertrophy. Left ventricular diastolic parameters were normal. 2. Right ventricular systolic function is normal. The right ventricular size is normal. There is mildly elevated pulmonary artery systolic pressure. The estimated right ventricular systolic pressure is 39.6 mmHg. 3. Left atrial size was moderately dilated. 4. Right atrial size was mildly dilated. 5. The mitral valve is normal in structure. Mild mitral valve regurgitation. No evidence of mitral stenosis. 6. The aortic valve is tricuspid. Aortic valve regurgitation is trivial. Aortic valve sclerosis/calcification is present, without any evidence of aortic stenosis. 7. The inferior vena cava is dilated in size with <50% respiratory variability, suggesting right atrial pressure of 15 mmHg.  FINDINGS Left Ventricle: Left ventricular ejection fraction, by estimation, is 55 to 60%. The left ventricle has normal function. The left ventricle has no regional wall motion abnormalities. The left ventricular internal cavity size was normal in size. There is mild left ventricular hypertrophy. Left ventricular diastolic parameters were normal.  Right Ventricle: The right ventricular size is normal. No increase in right ventricular wall thickness. Right ventricular systolic function is normal. There is mildly elevated pulmonary artery systolic pressure. The tricuspid regurgitant velocity is 2.48 m/s, and with an assumed right atrial pressure of 15 mmHg, the estimated right ventricular systolic pressure is 39.6 mmHg.  Left  Atrium: Left atrial size was moderately dilated.  Right Atrium: Right atrial size was mildly dilated.  Pericardium: Trivial pericardial effusion is present.  Mitral Valve: The mitral valve is normal in structure. Mild mitral valve regurgitation. No evidence of mitral valve stenosis.  Tricuspid Valve: The tricuspid valve is normal in structure. Tricuspid valve regurgitation is trivial.  Aortic Valve: The aortic valve is tricuspid. Aortic valve regurgitation is trivial. Aortic valve sclerosis/calcification is present, without any evidence of aortic stenosis. Aortic valve peak gradient measures 6.1 mmHg.  Pulmonic Valve: The pulmonic valve was normal in structure. Pulmonic valve regurgitation is not visualized.  Aorta: The aortic root is normal in size and structure.  Venous: The inferior vena cava is dilated in size with less than 50% respiratory variability, suggesting right atrial pressure of 15 mmHg.  IAS/Shunts: No atrial level shunt detected by color flow Doppler.   LEFT VENTRICLE PLAX 2D LVIDd:         5.25 cm   Diastology LVIDs:         3.40 cm   LV e' medial:    8.99 cm/s LV PW:         1.50 cm   LV E/e' medial:  12.0 LV IVS:        1.20 cm   LV e' lateral:   9.43 cm/s LVOT diam:     2.10 cm   LV E/e' lateral: 11.5 LV SV:         92 LV SV Index:   39 LVOT Area:     3.46 cm   RIGHT VENTRICLE             IVC RV Basal diam:  3.50 cm     IVC diam: 2.60 cm RV S prime:     10.30 cm/s TAPSE (M-mode): 1.6 cm  LEFT ATRIUM              Index        RIGHT ATRIUM           Index LA diam:        4.10 cm  1.73 cm/m   RA Area:     20.00 cm LA Vol (A2C):   114.0 ml 48.19 ml/m  RA Volume:   57.10 ml  24.14 ml/m LA Vol (A4C):   95.1 ml  40.20 ml/m LA Biplane Vol: 106.0 ml 44.81 ml/m AORTIC VALVE                 PULMONIC VALVE AV Area (Vmax): 3.25 cm     PV Vmax:       0.74 m/s AV Vmax:        123.50 cm/s  PV Peak grad:  2.2 mmHg AV Peak Grad:   6.1 mmHg LVOT Vmax:       116.00 cm/s LVOT Vmean:     76.700 cm/s LVOT VTI:       0.266 m  AORTA Ao Root diam: 3.70 cm Ao Asc diam:  3.30 cm  MITRAL VALVE                TRICUSPID VALVE MV Area (PHT): 7.82 cm     TR Peak grad:   24.6 mmHg MV Decel Time: 97 msec      TR Vmax:        248.00 cm/s MR Peak grad: 95.6 mmHg MR Vmax:      489.00 cm/s   SHUNTS MV E velocity: 108.00 cm/s  Systemic VTI:  0.27 m MV A velocity: 82.70 cm/s   Systemic Diam: 2.10 cm MV E/A ratio:  1.31  Dalton McleanMD Electronically signed by Wilfred Lacy Signature Date/Time: 11/11/2021/5:04:34 PM    Final    MONITORS  LONG TERM MONITOR (3-14 DAYS) 01/22/2022  Narrative Patch Wear Time:  7 days and 16 hours (2023-03-08T14:39:55-0500 to 2023-03-16T08:07:36-0400)  Atrial Fibrillation occurred continuously (100% burden), ranging from 40-134 bpm (avg of 76 bpm).  Good heart rate control is seen with ventricular rate 50 to 110 bpm 99% of the time during the day and 98% at night.  There were no pauses of 3 seconds or greater  Isolated VEs were rare (<1.0%), VE Couplets were rare (<  1.0%), and no VE Triplets were present.  There were no symptomatic or diary events           EKG:  EKG ordered today and personally reviewed.  The ekg ordered today demonstrates atrial fibrillation controlled rate 60 bpm no ventricular arrhythmia QT intervals normal   Physical Exam:    VS:  Ht 6\' 1"  (1.854 m)   BMI 33.34 kg/m     Wt Readings from Last 3 Encounters:  02/08/23 252 lb 11.2 oz (114.6 kg)  12/14/22 245 lb (111.1 kg)  11/14/22 245 lb 9.6 oz (111.4 kg)     GEN: He appears markedly improved from last visit appears stronger and examined in a wheelchair well nourished, well developed in no acute distress HEENT: Normal NECK: No JVD; No carotid bruits LYMPHATICS: No lymphadenopathy CARDIAC: Irregular rate and rhythm variable first heart sound  RESPIRATORY:  Clear to auscultation without rales, wheezing or rhonchi  ABDOMEN: Soft,  non-tender, non-distended MUSCULOSKELETAL:  No edema; No deformity  SKIN: Warm and dry NEUROLOGIC:  Alert and oriented x 3 PSYCHIATRIC:  Normal affect    Signed, Norman Herrlich, MD  03/24/2023 3:34 PM    [facility]

## 2023-03-24 ENCOUNTER — Ambulatory Visit: Payer: Medicare Other | Attending: Cardiology | Admitting: Cardiology

## 2023-03-24 ENCOUNTER — Encounter: Payer: Self-pay | Admitting: Cardiology

## 2023-03-24 VITALS — BP 184/90 | HR 66 | Ht 73.0 in | Wt 252.0 lb

## 2023-03-24 DIAGNOSIS — I472 Ventricular tachycardia, unspecified: Secondary | ICD-10-CM

## 2023-03-24 DIAGNOSIS — D500 Iron deficiency anemia secondary to blood loss (chronic): Secondary | ICD-10-CM

## 2023-03-24 DIAGNOSIS — I110 Hypertensive heart disease with heart failure: Secondary | ICD-10-CM | POA: Diagnosis not present

## 2023-03-24 DIAGNOSIS — I5032 Chronic diastolic (congestive) heart failure: Secondary | ICD-10-CM

## 2023-03-24 DIAGNOSIS — I4819 Other persistent atrial fibrillation: Secondary | ICD-10-CM

## 2023-03-24 NOTE — Patient Instructions (Signed)
Medication Instructions:  Your physician recommends that you continue on your current medications as directed. Please refer to the Current Medication list given to you today.  *If you need a refill on your cardiac medications before your next appointment, please call your pharmacy*   Lab Work: None If you have labs (blood work) drawn today and your tests are completely normal, you will receive your results only by: MyChart Message (if you have MyChart) OR A paper copy in the mail If you have any lab test that is abnormal or we need to change your treatment, we will call you to review the results.   Testing/Procedures: Your physician has requested that you have a renal artery duplex. During this test, an ultrasound is used to evaluate blood flow to the kidneys. Allow one hour for this exam. Do not eat after midnight the day before and avoid carbonated beverages. Take your medications as you usually do.    Follow-Up: At [facility], you and your health needs are our priority.  As part of our continuing mission to provide you with exceptional heart care, we have created designated Provider Care Teams.  These Care Teams include your primary Cardiologist (physician) and Advanced Practice Providers (APPs -  Physician Assistants and Nurse Practitioners) who all work together to provide you with the care you need, when you need it.  We recommend signing up for the patient portal called "MyChart".  Sign up information is provided on this After Visit Summary.  MyChart is used to connect with patients for Virtual Visits (Telemedicine).  Patients are able to view lab/test results, encounter notes, upcoming appointments, etc.  Non-urgent messages can be sent to your provider as well.   To learn more about what you can do with MyChart, go to [email or web address].    Your next appointment:   6 month(s)  Provider:   Norman Herrlich, MD    Other Instructions None

## 2023-03-24 NOTE — Addendum Note (Signed)
Addended by: Roxanne Mins I on: 03/24/2023 04:24 PM   Modules accepted: Orders

## 2023-04-03 ENCOUNTER — Ambulatory Visit: Payer: Medicare PPO

## 2023-04-05 ENCOUNTER — Inpatient Hospital Stay: Payer: Medicare PPO | Attending: Oncology

## 2023-04-05 VITALS — BP 142/61 | HR 57 | Temp 97.7°F | Resp 16 | Ht 73.0 in | Wt 264.3 lb

## 2023-04-05 DIAGNOSIS — D500 Iron deficiency anemia secondary to blood loss (chronic): Secondary | ICD-10-CM

## 2023-04-05 DIAGNOSIS — E538 Deficiency of other specified B group vitamins: Secondary | ICD-10-CM | POA: Diagnosis present

## 2023-04-05 DIAGNOSIS — D509 Iron deficiency anemia, unspecified: Secondary | ICD-10-CM | POA: Insufficient documentation

## 2023-04-05 MED ORDER — CYANOCOBALAMIN 1000 MCG/ML IJ SOLN
1000.0000 ug | Freq: Once | INTRAMUSCULAR | Status: AC
  Administered 2023-04-05: 1000 ug via INTRAMUSCULAR
  Filled 2023-04-05: qty 1

## 2023-04-16 IMAGING — MR MR ABDOMEN WO/W CM MRCP
19 of 21 series · 45 of 48 positions shown · IV contrast (Contrast agent)
Comparison: 11/10/2021 abdominal sonogram and chest CT angiogram.
COMPARISON: 11/10/2021 abdominal sonogram and chest CT angiogram.

Addendum:
CLINICAL DATA: Inpatient. Cholelithiasis, gallbladder wall
thickening and dilated common bile duct. Acute respiratory failure
with hypoxia. Abnormal liver function tests.

EXAM:
MRI ABDOMEN WITHOUT AND WITH CONTRAST (INCLUDING MRCP)
TECHNIQUE: Multiplanar multisequence MR imaging of the abdomen was performed
both before and after the administration of intravenous contrast.
Heavily T2-weighted images of the biliary and pancreatic ducts were
obtained, and three-dimensional MRCP images were rendered by post
processing.
CONTRAST:  10mL GADAVIST GADOBUTROL 1 MMOL/ML IV SOLN

[Series 4: ax haste · axial · 6.0mm · 1.31mm/px · 1 of 40 slices shown]
[im 1/40]
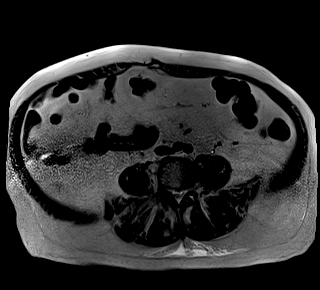

[Series 5: cor haste · coronal · 6.0mm · 1.41mm/px · 1 of 42 slices shown]
[im 1/42]
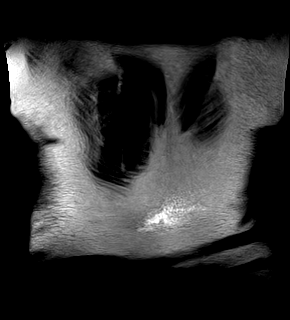

[Series 8: T2 fat-sat · axial · 6.0mm · 1.31mm/px · 1 of 42 slices shown]
[im 1/42]
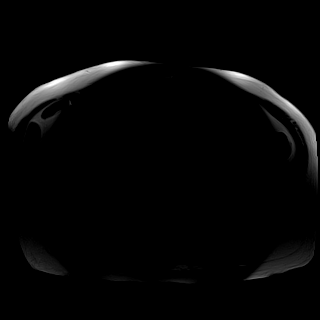

[Series 9: DWI · axial · 6.0mm · 1.57mm/px · z∈[-177,+89]mm · 3 of 114 slices shown (1 of 2)]
[im 1/114]
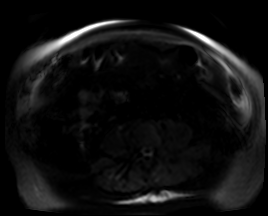
[im 57/114]
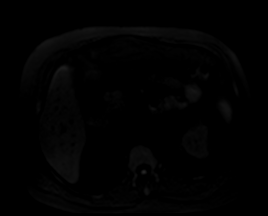
[im 114/114]
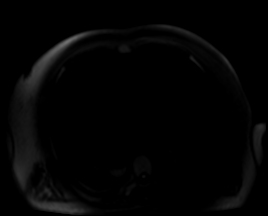

[Series 10: DWI · axial · 6.0mm · 1.57mm/px · 1 of 38 slices shown (2 of 2)]
[im 1/38]
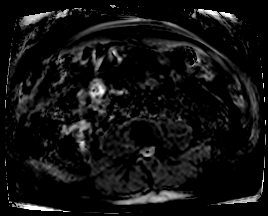

[Series 11: ax in and · axial · 3.0mm · 1.31mm/px · z∈[-192,+69]mm · 3 of 88 slices shown (1 of 2)]
[im 1/88]
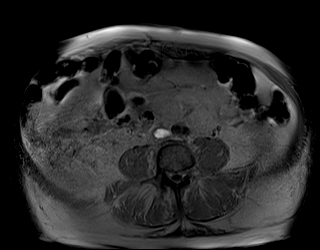
[im 44/88]
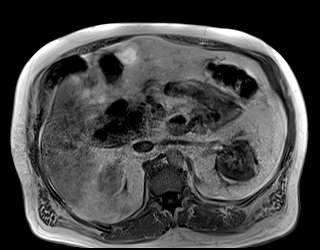
[im 88/88]
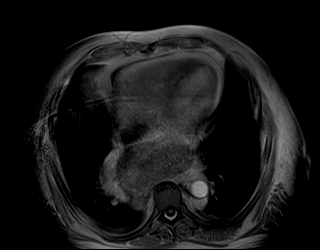

[Series 11: ax in and · axial · 3.0mm · 1.31mm/px · z∈[-192,+69]mm · 3 of 88 slices shown (2 of 2)]
[im 1/88]
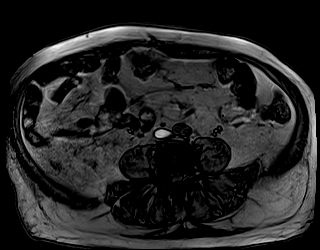
[im 44/88]
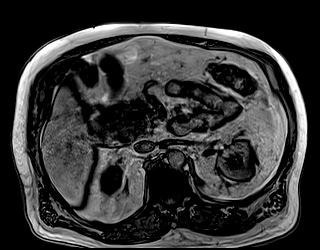
[im 88/88]
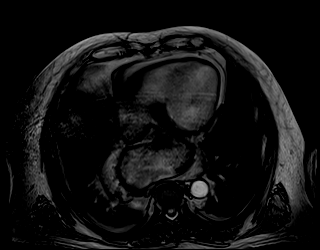

[Series 16: MRCP · coronal · 4.0mm · 1.12mm/px · 1 of 18 slices shown]
[im 1/18]
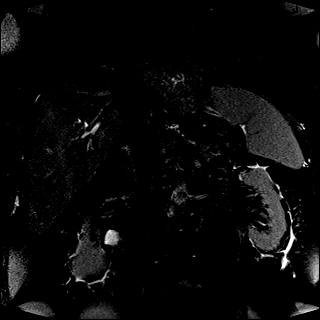

[Series 17: radials · coronal · 50.0mm · 0.78mm/px · 1 of 5 slices shown]
[im 1/5]
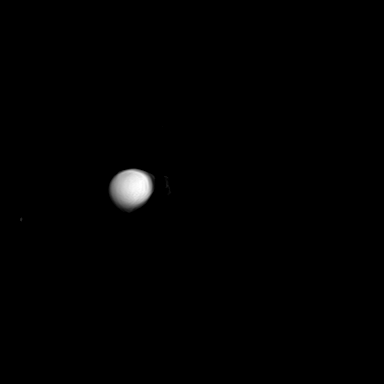

[Series 18: T1 dynamic · axial · non-contrast · 3.0mm · 1.31mm/px · z∈[-192,+69]mm · 3 of 88 slices shown (1 of 5)]
[im 1/88]
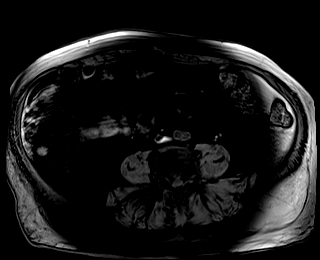
[im 44/88]
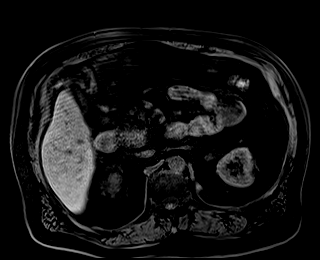
[im 88/88]
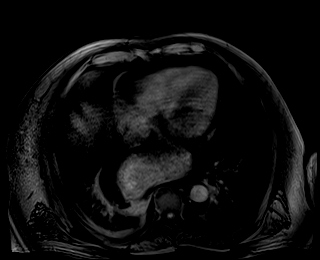

[Series 20: T1 dynamic post-contrast · axial · 3.0mm · 1.31mm/px · z∈[-192,+69]mm · 3 of 88 slices shown (1 of 5)]
[im 1/88]
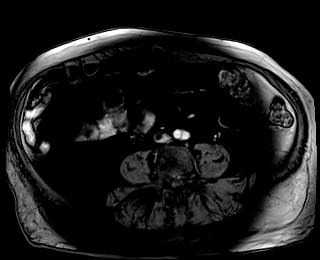
[im 44/88]
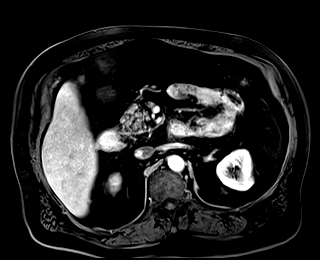
[im 88/88]
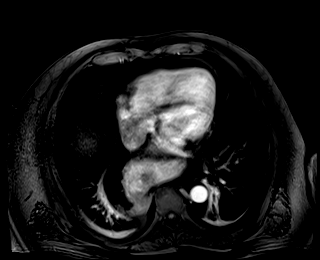

[Series 21: T1 dynamic · axial · 3.0mm · 1.31mm/px · z∈[-192,+69]mm · 3 of 88 slices shown (2 of 5)]
[im 1/88]
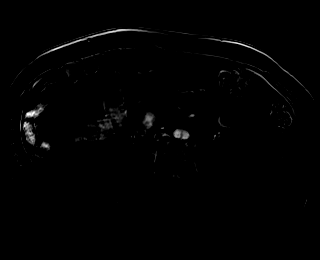
[im 44/88]
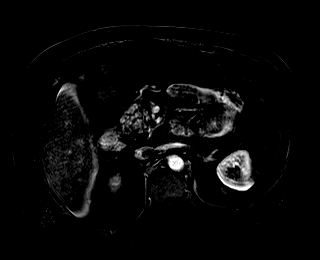
[im 88/88]
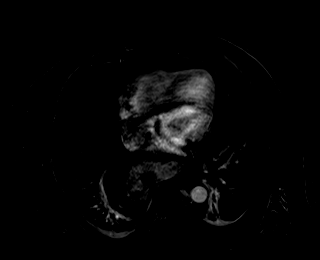

[Series 22: T1 dynamic post-contrast · axial · 3.0mm · 1.31mm/px · z∈[-192,+69]mm · 3 of 88 slices shown (2 of 5)]
[im 1/88]
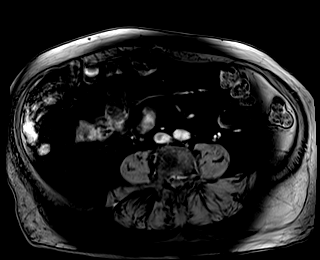
[im 44/88]
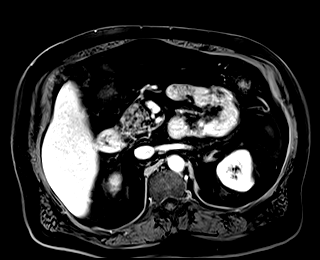
[im 88/88]
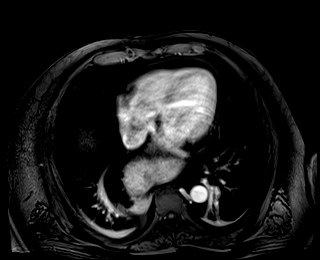

[Series 23: T1 dynamic · axial · 3.0mm · 1.31mm/px · z∈[-192,+69]mm · 3 of 88 slices shown (3 of 5)]
[im 1/88]
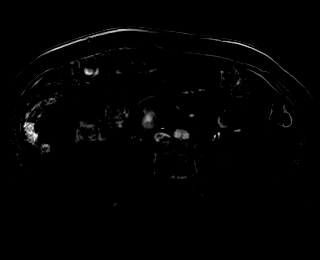
[im 44/88]
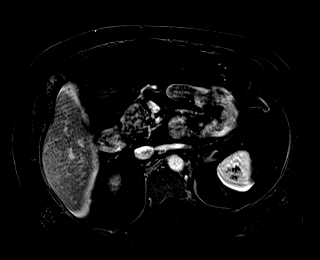
[im 88/88]
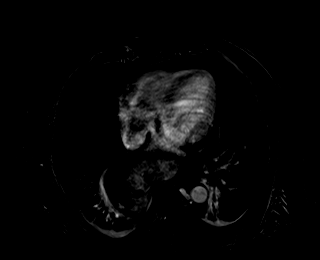

[Series 24: T1 dynamic post-contrast · axial · 3.0mm · 1.31mm/px · z∈[-192,+69]mm · 3 of 88 slices shown (3 of 5)]
[im 1/88]
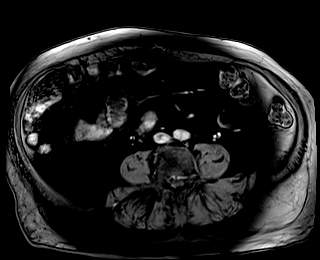
[im 44/88]
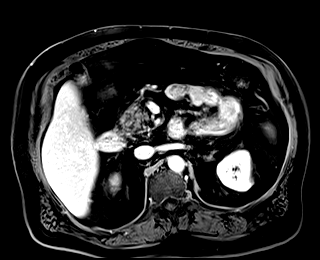
[im 88/88]
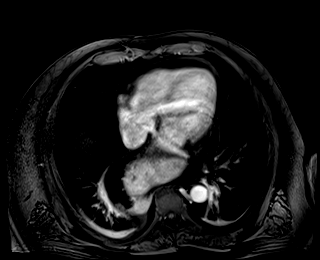

[Series 25: T1 dynamic · axial · 3.0mm · 1.31mm/px · z∈[-192,+69]mm · 3 of 88 slices shown (4 of 5)]
[im 1/88]
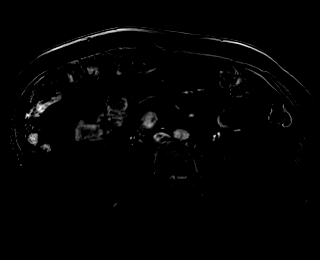
[im 44/88]
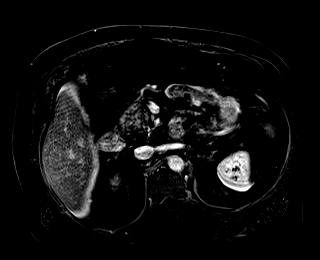
[im 88/88]
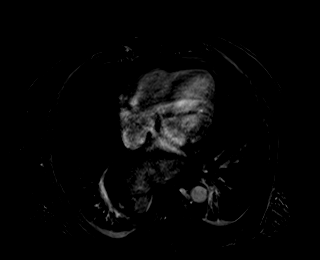

[Series 26: T1 dynamic post-contrast · axial · 3.0mm · 1.31mm/px · z∈[-192,+69]mm · 3 of 88 slices shown (4 of 5)]
[im 1/88]
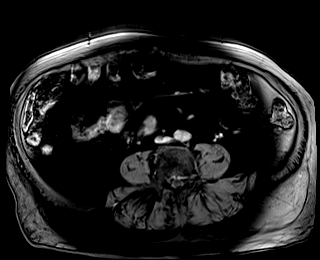
[im 44/88]
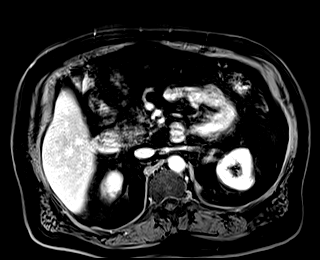
[im 88/88]
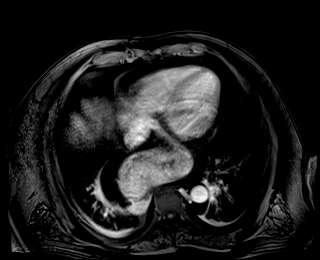

[Series 27: T1 dynamic · axial · 3.0mm · 1.31mm/px · z∈[-192,+69]mm · 3 of 88 slices shown (5 of 5)]
[im 1/88]
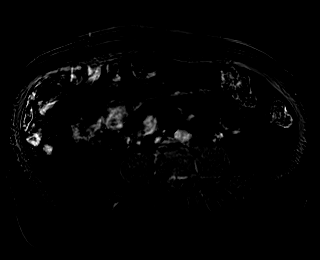
[im 44/88]
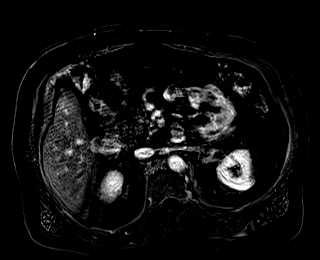
[im 88/88]
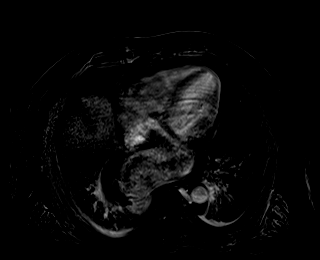

[Series 28: T1 dynamic post-contrast · coronal · 3.0mm · 1.41mm/px · 3 of 88 slices shown (5 of 5)]
[im 1/88]
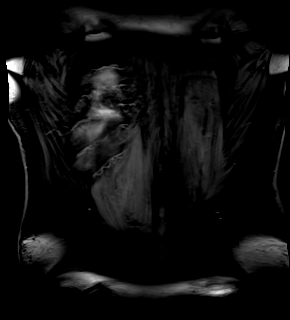
[im 44/88]
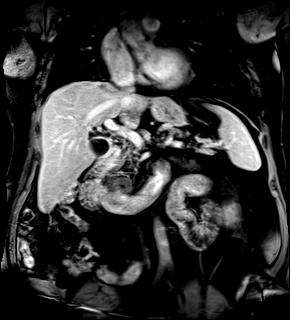
[im 88/88]
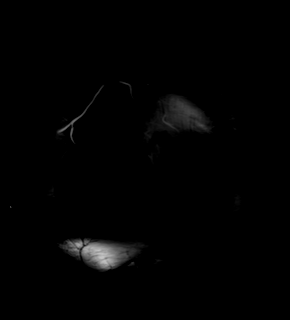

[45 of 48 positions shown; findings below may reference images not displayed]

FINDINGS: Lower chest: Compressive atelectasis from the large hiatal hernia at
the medial lung bases.

Hepatobiliary: Normal liver size and configuration. No significant
hepatic steatosis. No liver mass. Right ring 8 mm gallstone in the
gallbladder with borderline mild gallbladder distension and
borderline mild diffuse gallbladder wall thickening. No
pericholecystic fluid. No biliary ductal dilatation. Common bile
duct diameter 6 mm. No compelling evidence of choledocholithiasis on
the motion degraded MRCP sequence. No biliary masses, strictures or
beading. Moderate to large periampullary duodenal diverticulum.

Pancreas: No pancreatic mass or duct dilation.  No pancreas divisum.

Spleen: Normal size. No mass.

Adrenals/Urinary Tract: Normal adrenals. No hydronephrosis.
Subcentimeter simple posterior upper right renal cortical cyst. No
suspicious renal masses.

Stomach/Bowel: Large hiatal hernia. Stomach is nondistended and
otherwise normal. Visualized small and large bowel is normal
caliber, with no bowel wall thickening.

Vascular/Lymphatic: Normal caliber abdominal aorta. Patent portal,
splenic, hepatic and renal veins. No pathologically enlarged lymph
nodes in the abdomen.

Other: No abdominal ascites or focal fluid collection.

Musculoskeletal: No aggressive appearing focal osseous lesions.
IMPRESSION: 1. Cholelithiasis. Borderline mild gallbladder distension and
borderline mild diffuse gallbladder wall thickening. No
pericholecystic fluid. Findings are equivocal for acute
cholecystitis. Hepatobiliary scintigraphy study may be obtained as
clinically warranted.
2. No biliary ductal dilatation. CBD diameter 6 mm. No compelling
evidence of choledocholithiasis on the motion degraded MRCP
sequence.
3. Moderate to large periampullary duodenal diverticulum.
4. Large hiatal hernia.

ADDENDUM:
For the subcentimeter simple posterior upper right renal cortical
cyst, no follow-up imaging is recommended.

*** End of Addendum ***
FINDINGS: Lower chest: Compressive atelectasis from the large hiatal hernia at
the medial lung bases.

Hepatobiliary: Normal liver size and configuration. No significant
hepatic steatosis. No liver mass. Right ring 8 mm gallstone in the
gallbladder with borderline mild gallbladder distension and
borderline mild diffuse gallbladder wall thickening. No
pericholecystic fluid. No biliary ductal dilatation. Common bile
duct diameter 6 mm. No compelling evidence of choledocholithiasis on
the motion degraded MRCP sequence. No biliary masses, strictures or
beading. Moderate to large periampullary duodenal diverticulum.

Pancreas: No pancreatic mass or duct dilation.  No pancreas divisum.

Spleen: Normal size. No mass.

Adrenals/Urinary Tract: Normal adrenals. No hydronephrosis.
Subcentimeter simple posterior upper right renal cortical cyst. No
suspicious renal masses.

Stomach/Bowel: Large hiatal hernia. Stomach is nondistended and
otherwise normal. Visualized small and large bowel is normal
caliber, with no bowel wall thickening.

Vascular/Lymphatic: Normal caliber abdominal aorta. Patent portal,
splenic, hepatic and renal veins. No pathologically enlarged lymph
nodes in the abdomen.

Other: No abdominal ascites or focal fluid collection.

Musculoskeletal: No aggressive appearing focal osseous lesions.
IMPRESSION: 1. Cholelithiasis. Borderline mild gallbladder distension and
borderline mild diffuse gallbladder wall thickening. No
pericholecystic fluid. Findings are equivocal for acute
cholecystitis. Hepatobiliary scintigraphy study may be obtained as
clinically warranted.
2. No biliary ductal dilatation. CBD diameter 6 mm. No compelling
evidence of choledocholithiasis on the motion degraded MRCP
sequence.
3. Moderate to large periampullary duodenal diverticulum.
4. Large hiatal hernia.

## 2023-05-01 ENCOUNTER — Ambulatory Visit: Payer: Medicare PPO

## 2023-05-02 ENCOUNTER — Encounter: Payer: Self-pay | Admitting: Oncology

## 2023-05-03 ENCOUNTER — Inpatient Hospital Stay: Payer: Medicare PPO | Attending: Oncology

## 2023-05-03 VITALS — BP 109/82 | HR 62 | Temp 98.1°F | Resp 22 | Wt 259.9 lb

## 2023-05-03 DIAGNOSIS — D500 Iron deficiency anemia secondary to blood loss (chronic): Secondary | ICD-10-CM

## 2023-05-03 DIAGNOSIS — D509 Iron deficiency anemia, unspecified: Secondary | ICD-10-CM | POA: Diagnosis present

## 2023-05-03 DIAGNOSIS — E538 Deficiency of other specified B group vitamins: Secondary | ICD-10-CM | POA: Diagnosis not present

## 2023-05-03 MED ORDER — CYANOCOBALAMIN 1000 MCG/ML IJ SOLN
1000.0000 ug | Freq: Once | INTRAMUSCULAR | Status: AC
  Administered 2023-05-03: 1000 ug via INTRAMUSCULAR
  Filled 2023-05-03: qty 1

## 2023-05-03 NOTE — Progress Notes (Signed)
Indwelling urinary cath intact draining amber urine

## 2023-05-03 NOTE — Patient Instructions (Signed)

## 2023-05-08 ENCOUNTER — Encounter: Payer: Self-pay | Admitting: Oncology

## 2023-05-08 ENCOUNTER — Telehealth: Payer: Self-pay | Admitting: Cardiology

## 2023-05-08 NOTE — Telephone Encounter (Signed)
Pt spouse called in stating she would ike to know what medications pt can and cannot take before his renal appt tomorrow

## 2023-05-08 NOTE — Telephone Encounter (Signed)
Called Britta Mccreedy, the patient's spouse, and reviewed the instructions with her for the patient to have his renal ultrasound. Britta Mccreedy verbalized understanding and  had no further questions at this time.

## 2023-05-09 ENCOUNTER — Ambulatory Visit: Payer: Medicare PPO | Attending: Cardiology

## 2023-05-09 DIAGNOSIS — D500 Iron deficiency anemia secondary to blood loss (chronic): Secondary | ICD-10-CM

## 2023-05-09 DIAGNOSIS — I472 Ventricular tachycardia, unspecified: Secondary | ICD-10-CM

## 2023-05-09 DIAGNOSIS — I4819 Other persistent atrial fibrillation: Secondary | ICD-10-CM

## 2023-05-09 DIAGNOSIS — I5032 Chronic diastolic (congestive) heart failure: Secondary | ICD-10-CM

## 2023-05-09 DIAGNOSIS — I110 Hypertensive heart disease with heart failure: Secondary | ICD-10-CM | POA: Diagnosis not present

## 2023-05-14 NOTE — Progress Notes (Signed)
[facility]  [address] [place],  [place]  [address] [phone]  Clinic Day:  05/15/2023  Referring physician: Everlean Cherry, MD  HISTORY OF PRESENT ILLNESS:  The patient is a 72 y.o. 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 He has received IV iron, which was effective in bolstering his iron stores and improving his hemoglobin.   He continues to take monthly B12 injections to fortify his cobalamin stores.  He comes in today to reassess his anemia.  Since his last visit, the patient has been doing okay.  He denies having increased fatigue or any overt forms of blood loss which concern him for progressive anemia.  PHYSICAL EXAM:  Blood pressure (!) 141/82, pulse 60, temperature 97.9 F (36.6 C), resp. rate 16, height 6\' 1"  (1.854 m), SpO2 93%. Wt Readings from Last 3 Encounters:  05/03/23 259 lb 14.4 oz (117.9 kg)  04/05/23 264 lb 4.8 oz (119.9 kg)  03/24/23 252 lb (114.3 kg)   Body mass index is 34.29 kg/m. Performance status (ECOG): 2 - Symptomatic, <50% confined to bed Physical Exam Constitutional:      Appearance: Normal appearance. He is not ill-appearing.     Comments: A chronically ill-appearing gentleman who is in a wheelchair.  A Foley catheter is in place  HENT:     Mouth/Throat:     Mouth: Mucous membranes are moist.     Pharynx: Oropharynx is clear. No oropharyngeal exudate or posterior oropharyngeal erythema.  Cardiovascular:     Rate and Rhythm: Normal rate and regular rhythm.     Heart sounds: No murmur heard.    No friction rub. No gallop.  Pulmonary:     Effort: Pulmonary effort is normal. No respiratory distress.     Breath sounds: Normal breath sounds. No wheezing, rhonchi or rales.  Abdominal:     General: Bowel sounds are normal. There is no distension.     Palpations: Abdomen is soft. There is no mass.     Tenderness: There is no abdominal tenderness.  Musculoskeletal:         General: No swelling.     Right lower leg: No edema.     Left lower leg: No edema.  Lymphadenopathy:     Cervical: No cervical adenopathy.     Upper Body:     Right upper body: No supraclavicular or axillary adenopathy.     Left upper body: No supraclavicular or axillary adenopathy.     Lower Body: No right inguinal adenopathy. No left inguinal adenopathy.  Skin:    General: Skin is warm.     Coloration: Skin is not jaundiced.     Findings: No lesion or rash.  Neurological:     General: No focal deficit present.     Mental Status: He is alert and oriented to person, place, and time. Mental status is at baseline.  Psychiatric:        Mood and Affect: Mood normal.        Behavior: Behavior normal.        Thought Content: Thought content normal.   LABS:    Latest Reference Range & Units 05/15/23 13:25  WBC 4.0 - 10.5 K/uL 11.1 (H)  RBC 4.22 - 5.81 MIL/uL 3.82 (L)  Hemoglobin 13.0 - 17.0 g/dL 91.4 (L)  HCT 78.2 - 95.6 % 36.9 (L)  MCV 80.0 - 100.0 fL 96.6  MCH 26.0 - 34.0 pg 31.2  MCHC 30.0 - 36.0 g/dL 16.1  RDW 09.6 - 04.5 % 13.5  Platelets 150 - 400 K/uL 189  nRBC 0.0 - 0.2 % 0.0  Neutrophils % 73  Lymphocytes % 13  Monocytes Relative % 8  Eosinophil % 4  Basophil % 1  Immature Granulocytes % 1  (H): Data is abnormally high (L): Data is abnormally low   Latest Reference Range & Units 05/15/23 13:25  Sodium 135 - 145 mmol/L 139  Potassium 3.5 - 5.1 mmol/L 4.3  Chloride 98 - 111 mmol/L 103  CO2 22 - 32 mmol/L 25  Glucose 70 - 99 mg/dL 99  BUN 8 - 23 mg/dL 24 (H)  Creatinine 4.09 - 1.24 mg/dL 8.11 (H)  Calcium 8.9 - 10.3 mg/dL 9.0  Anion gap 5 - 15  11  Alkaline Phosphatase 38 - 126 U/L 32 (L)  Albumin 3.5 - 5.0 g/dL 3.7  AST 15 - 41 U/L 16  ALT 0 - 44 U/L 20  Total Protein 6.5 - 8.1 g/dL 7.0  Total Bilirubin 0.3 - 1.2 mg/dL 0.7  GFR, Est Non African American >60 mL/min 54 (L)  (H): Data is abnormally high (L): Data is abnormally low   Latest Reference  Range & Units 05/15/23 13:25  Iron 45 - 182 ug/dL 61  UIBC ug/dL 914  TIBC 782 - 956 ug/dL 213  Saturation Ratios 17.9 - 39.5 % 19  Ferritin 24 - 336 ng/mL 72  Folate >5.9 ng/mL 15.5  Vitamin B12 180 - 914 pg/mL 552   ASSESSMENT & PLAN:  A 72 y.o. male with anemia secondary to both iron deficiency and vitamin B12 deficiency.  His hemoglobin of 11.9 is lower than previously.  However, his iron and B12 levels remain normal.  He knows to continue taking his monthly B12 injections to ensure adequate fortification of his cobalamin stores.- Per his labs today, it does appear he may have a mild component of renal insufficiency factoring into his anemia.  However, as his hemoglobin is adequate, no intervention is necessary at this time.  Per his wife's request, I will see him back in March 2025 for repeat clinical assessment. The patient understands all the plans discussed today and is in agreement with them.  Elois Averitt Kirby Funk, MD

## 2023-05-15 ENCOUNTER — Other Ambulatory Visit: Payer: Self-pay | Admitting: Oncology

## 2023-05-15 ENCOUNTER — Inpatient Hospital Stay: Payer: Medicare PPO | Admitting: Oncology

## 2023-05-15 ENCOUNTER — Inpatient Hospital Stay: Payer: Medicare PPO

## 2023-05-15 ENCOUNTER — Telehealth: Payer: Self-pay | Admitting: Oncology

## 2023-05-15 VITALS — BP 141/82 | HR 60 | Temp 97.9°F | Resp 16 | Ht 73.0 in

## 2023-05-15 DIAGNOSIS — E538 Deficiency of other specified B group vitamins: Secondary | ICD-10-CM | POA: Diagnosis not present

## 2023-05-15 DIAGNOSIS — D500 Iron deficiency anemia secondary to blood loss (chronic): Secondary | ICD-10-CM

## 2023-05-15 LAB — CMP (CANCER CENTER ONLY)
ALT: 20 U/L (ref 0–44)
AST: 16 U/L (ref 15–41)
Albumin: 3.7 g/dL (ref 3.5–5.0)
Alkaline Phosphatase: 32 U/L — ABNORMAL LOW (ref 38–126)
Anion gap: 11 (ref 5–15)
BUN: 24 mg/dL — ABNORMAL HIGH (ref 8–23)
CO2: 25 mmol/L (ref 22–32)
Calcium: 9 mg/dL (ref 8.9–10.3)
Chloride: 103 mmol/L (ref 98–111)
Creatinine: 1.37 mg/dL — ABNORMAL HIGH (ref 0.61–1.24)
GFR, Estimated: 54 mL/min — ABNORMAL LOW (ref >60)
Glucose, Bld: 99 mg/dL (ref 70–99)
Potassium: 4.3 mmol/L (ref 3.5–5.1)
Sodium: 139 mmol/L (ref 135–145)
Total Bilirubin: 0.7 mg/dL (ref 0.3–1.2)
Total Protein: 7 g/dL (ref 6.5–8.1)

## 2023-05-15 LAB — CBC WITH DIFFERENTIAL (CANCER CENTER ONLY)
Abs Immature Granulocytes: 0.11 10*3/uL — ABNORMAL HIGH (ref 0.00–0.07)
Basophils Absolute: 0.1 10*3/uL (ref 0.0–0.1)
Basophils Relative: 1 %
Eosinophils Absolute: 0.5 10*3/uL (ref 0.0–0.5)
Eosinophils Relative: 4 %
HCT: 36.9 % — ABNORMAL LOW (ref 39.0–52.0)
Hemoglobin: 11.9 g/dL — ABNORMAL LOW (ref 13.0–17.0)
Immature Granulocytes: 1 %
Lymphocytes Relative: 13 %
Lymphs Abs: 1.4 10*3/uL (ref 0.7–4.0)
MCH: 31.2 pg (ref 26.0–34.0)
MCHC: 32.2 g/dL (ref 30.0–36.0)
MCV: 96.6 fL (ref 80.0–100.0)
Monocytes Absolute: 0.9 10*3/uL (ref 0.1–1.0)
Monocytes Relative: 8 %
Neutro Abs: 8.2 10*3/uL — ABNORMAL HIGH (ref 1.7–7.7)
Neutrophils Relative %: 73 %
Platelet Count: 189 10*3/uL (ref 150–400)
RBC: 3.82 MIL/uL — ABNORMAL LOW (ref 4.22–5.81)
RDW: 13.5 % (ref 11.5–15.5)
WBC Count: 11.1 10*3/uL — ABNORMAL HIGH (ref 4.0–10.5)
nRBC: 0 % (ref 0.0–0.2)

## 2023-05-15 LAB — IRON AND TIBC
Iron: 61 ug/dL (ref 45–182)
Saturation Ratios: 19 % (ref 17.9–39.5)
TIBC: 325 ug/dL (ref 250–450)
UIBC: 264 ug/dL

## 2023-05-15 LAB — FOLATE: Folate: 15.5 ng/mL (ref >5.9)

## 2023-05-15 LAB — FERRITIN: Ferritin: 72 ng/mL (ref 24–336)

## 2023-05-15 LAB — VITAMIN B12: Vitamin B-12: 552 pg/mL (ref 180–914)

## 2023-05-15 NOTE — Telephone Encounter (Signed)
05/15/23 Spoke with patient and confirmed next appt.Patient requested appt be moved out to March 2025

## 2023-05-16 ENCOUNTER — Encounter: Payer: Self-pay | Admitting: Oncology

## 2023-05-17 ENCOUNTER — Telehealth: Payer: Self-pay

## 2023-05-17 ENCOUNTER — Telehealth: Payer: Self-pay | Admitting: Oncology

## 2023-05-17 NOTE — Addendum Note (Signed)
Addended by: Rennis Harding A on: 05/17/2023 02:06 PM   Modules accepted: Orders, Level of Service

## 2023-05-17 NOTE — Telephone Encounter (Signed)
  Latest Reference Range & Units 05/15/23 13:25   Iron 45 - 182 ug/dL 61  UIBC ug/dL 409  TIBC 811 - 914 ug/dL 782  Saturation Ratios 17.9 - 39.5 % 19  Ferritin 24 - 336 ng/mL 72  Folate >5.9 ng/mL 15.5  Vitamin B12 180 - 914 pg/mL 552    ASSESSMENT & PLAN:  A 72 y.o. male with anemia secondary to both iron deficiency and vitamin B12 deficiency.  His hemoglobin of 11.9 is lower than previously.  However, his iron and B12 levels remain normal.  He knows to continue taking his monthly B12 injections to ensure adequate fortification of his cobalamin stores. Per his labs today, it does appear he may have a mild component of renal insufficiency factoring into his anemia.  However, as his hemoglobin is adequate, no intervention is necessary at this time.  Per his wife's request, I will see him back in March 2025 for repeat clinical assessment. The patient understands all the plans discussed today and is in agreement with them.   Dequincy Kirby Funk, MD

## 2023-05-17 NOTE — Telephone Encounter (Signed)
05/17/23 Spoke with patients wife and scheduled B12 injections.

## 2023-06-06 ENCOUNTER — Encounter: Payer: Self-pay | Admitting: Oncology

## 2023-06-07 ENCOUNTER — Inpatient Hospital Stay: Payer: Medicare PPO | Attending: Oncology

## 2023-06-07 VITALS — BP 136/77 | HR 64 | Temp 97.7°F

## 2023-06-07 DIAGNOSIS — D509 Iron deficiency anemia, unspecified: Secondary | ICD-10-CM | POA: Diagnosis present

## 2023-06-07 DIAGNOSIS — E538 Deficiency of other specified B group vitamins: Secondary | ICD-10-CM | POA: Insufficient documentation

## 2023-06-07 DIAGNOSIS — D500 Iron deficiency anemia secondary to blood loss (chronic): Secondary | ICD-10-CM

## 2023-06-07 MED ORDER — CYANOCOBALAMIN 1000 MCG/ML IJ SOLN
1000.0000 ug | Freq: Once | INTRAMUSCULAR | Status: AC
  Administered 2023-06-07: 1000 ug via INTRAMUSCULAR
  Filled 2023-06-07: qty 1

## 2023-06-07 NOTE — Patient Instructions (Signed)
Vitamin B12 Injection What is this medication? Vitamin B12 (VAHY tuh min B12) prevents and treats low vitamin B12 levels in your body. It is used in people who do not get enough vitamin B12 from their diet or when their digestive tract does not absorb enough. Vitamin B12 plays an important role in maintaining the health of your nervous system and red blood cells. This medicine may be used for other purposes; ask your health care provider or pharmacist if you have questions. COMMON BRAND NAME(S): B-12 Compliance Kit, B-12 Injection Kit, Cyomin, Dodex, LA-12, Nutri-Twelve, Physicians EZ Use B-12, Primabalt, Vitamin Deficiency Injectable System - B12 What should I tell my care team before I take this medication? They need to know if you have any of these conditions: Kidney disease Leber's disease Megaloblastic anemia An unusual or allergic reaction to cyanocobalamin, cobalt, other medications, foods, dyes, or preservatives Pregnant or trying to get pregnant Breast-feeding How should I use this medication? This medication is injected into a muscle or deeply under the skin. It is usually given in a clinic or care team's office. However, your care team may teach you how to inject yourself. Follow all instructions. Talk to your care team about the use of this medication in children. Special care may be needed. Overdosage: If you think you have taken too much of this medicine contact a poison control center or emergency room at once. NOTE: This medicine is only for you. Do not share this medicine with others. What if I miss a dose? If you are given your dose at a clinic or care team's office, call to reschedule your appointment. If you give your own injections, and you miss a dose, take it as soon as you can. If it is almost time for your next dose, take only that dose. Do not take double or extra doses. What may interact with this medication? Alcohol Colchicine This list may not describe all possible  interactions. Give your health care provider a list of all the medicines, herbs, non-prescription drugs, or dietary supplements you use. Also tell them if you smoke, drink alcohol, or use illegal drugs. Some items may interact with your medicine. What should I watch for while using this medication? Visit your care team regularly. You may need blood work done while you are taking this medication. You may need to follow a special diet. Talk to your care team. Limit your alcohol intake and avoid smoking to get the best benefit. What side effects may I notice from receiving this medication? Side effects that you should report to your care team as soon as possible: Allergic reactions--skin rash, itching, hives, swelling of the face, lips, tongue, or throat Swelling of the ankles, hands, or feet Trouble breathing Side effects that usually do not require medical attention (report to your care team if they continue or are bothersome): Diarrhea This list may not describe all possible side effects. Call your doctor for medical advice about side effects. You may report side effects to FDA at 1-800-FDA-1088. Where should I keep my medication? Keep out of the reach of children. Store at room temperature between 15 and 30 degrees C (59 and 85 degrees F). Protect from light. Throw away any unused medication after the expiration date. NOTE: This sheet is a summary. It may not cover all possible information. If you have questions about this medicine, talk to your doctor, pharmacist, or health care provider.  2024 Elsevier/Gold Standard (2021-06-29 00:00:00)

## 2023-06-19 IMAGING — DX DG CHEST 1V PORT
1 series · 2 of 2 positions shown · non-contrast
Comparison: 11/10/2021

CLINICAL DATA: Difficulty breathing

EXAM:
PORTABLE CHEST 1 VIEW

[Series 1: chest · 0.14mm/px · 2 of 2 slices shown]
[im 1/2]
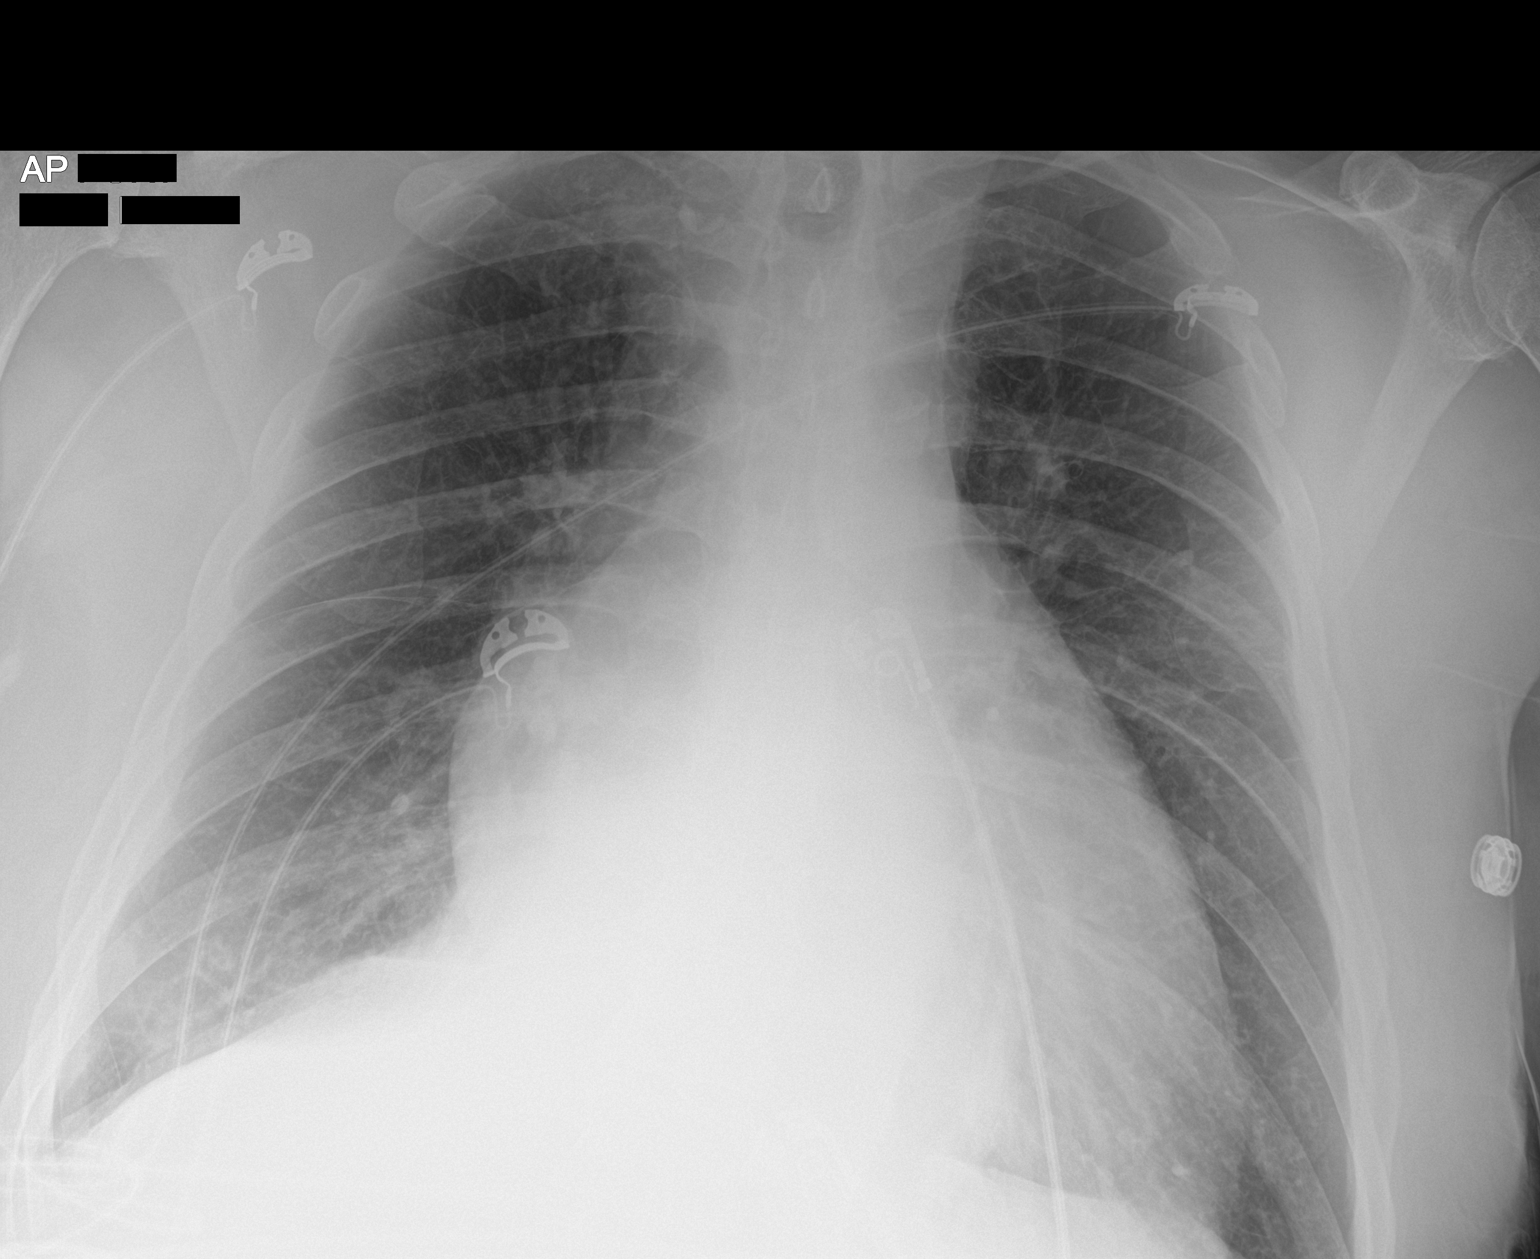
[im 2/2]
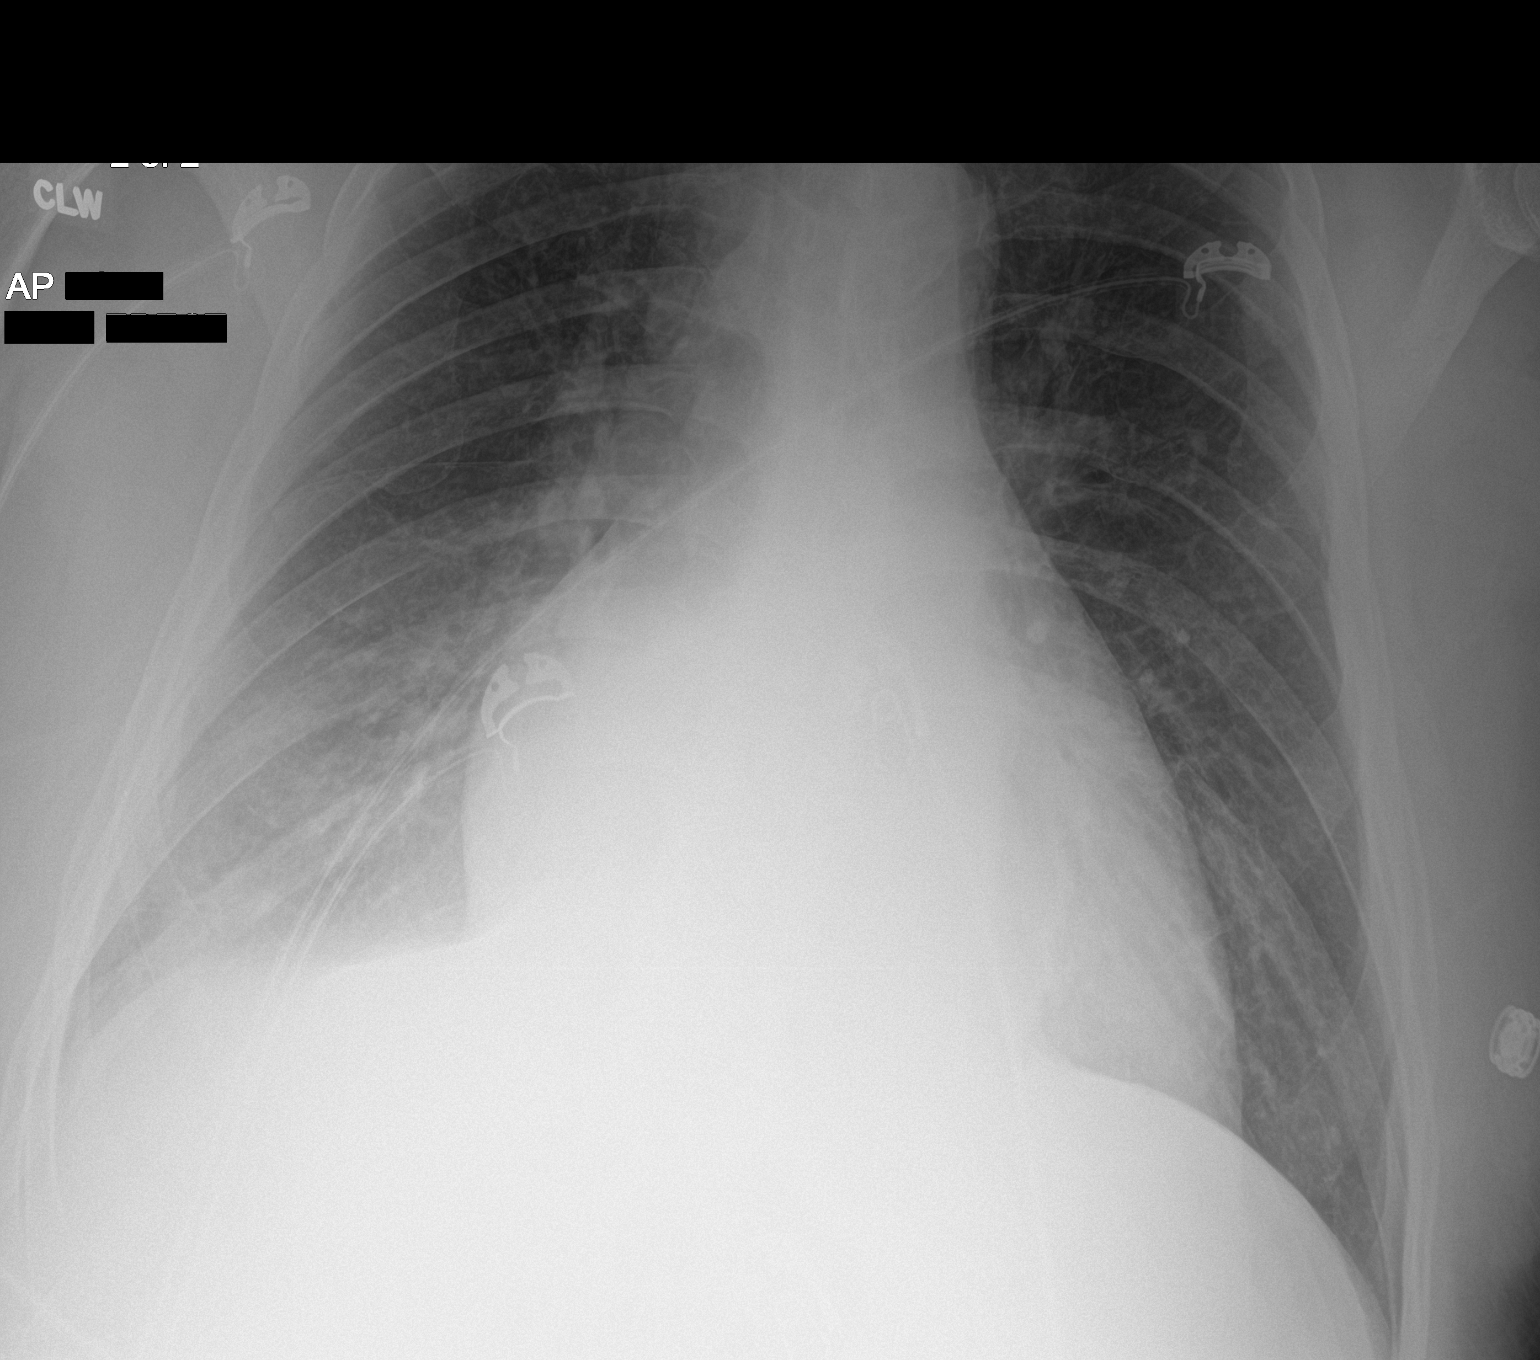

[2 of 2 positions shown; findings below may reference images not displayed]

FINDINGS: Transverse diameter of heart is increased. There is prominence of
retrocardiac soft tissue densities, possibly suggesting fixed hiatal
hernia. There is haziness in the right lower lung fields. There are
no signs of alveolar pulmonary edema or focal pulmonary
consolidation. There is no pneumothorax.
IMPRESSION: Cardiomegaly. Haziness in the right lower lung fields may suggest
layering of small right pleural effusion.

## 2023-07-02 IMAGING — CR DG CHEST 2V
2 series · 2 of 2 positions shown · non-contrast
Comparison: 01/13/2022

CLINICAL DATA: Shortness of breath

EXAM:
CHEST - 2 VIEW

[chest lat]
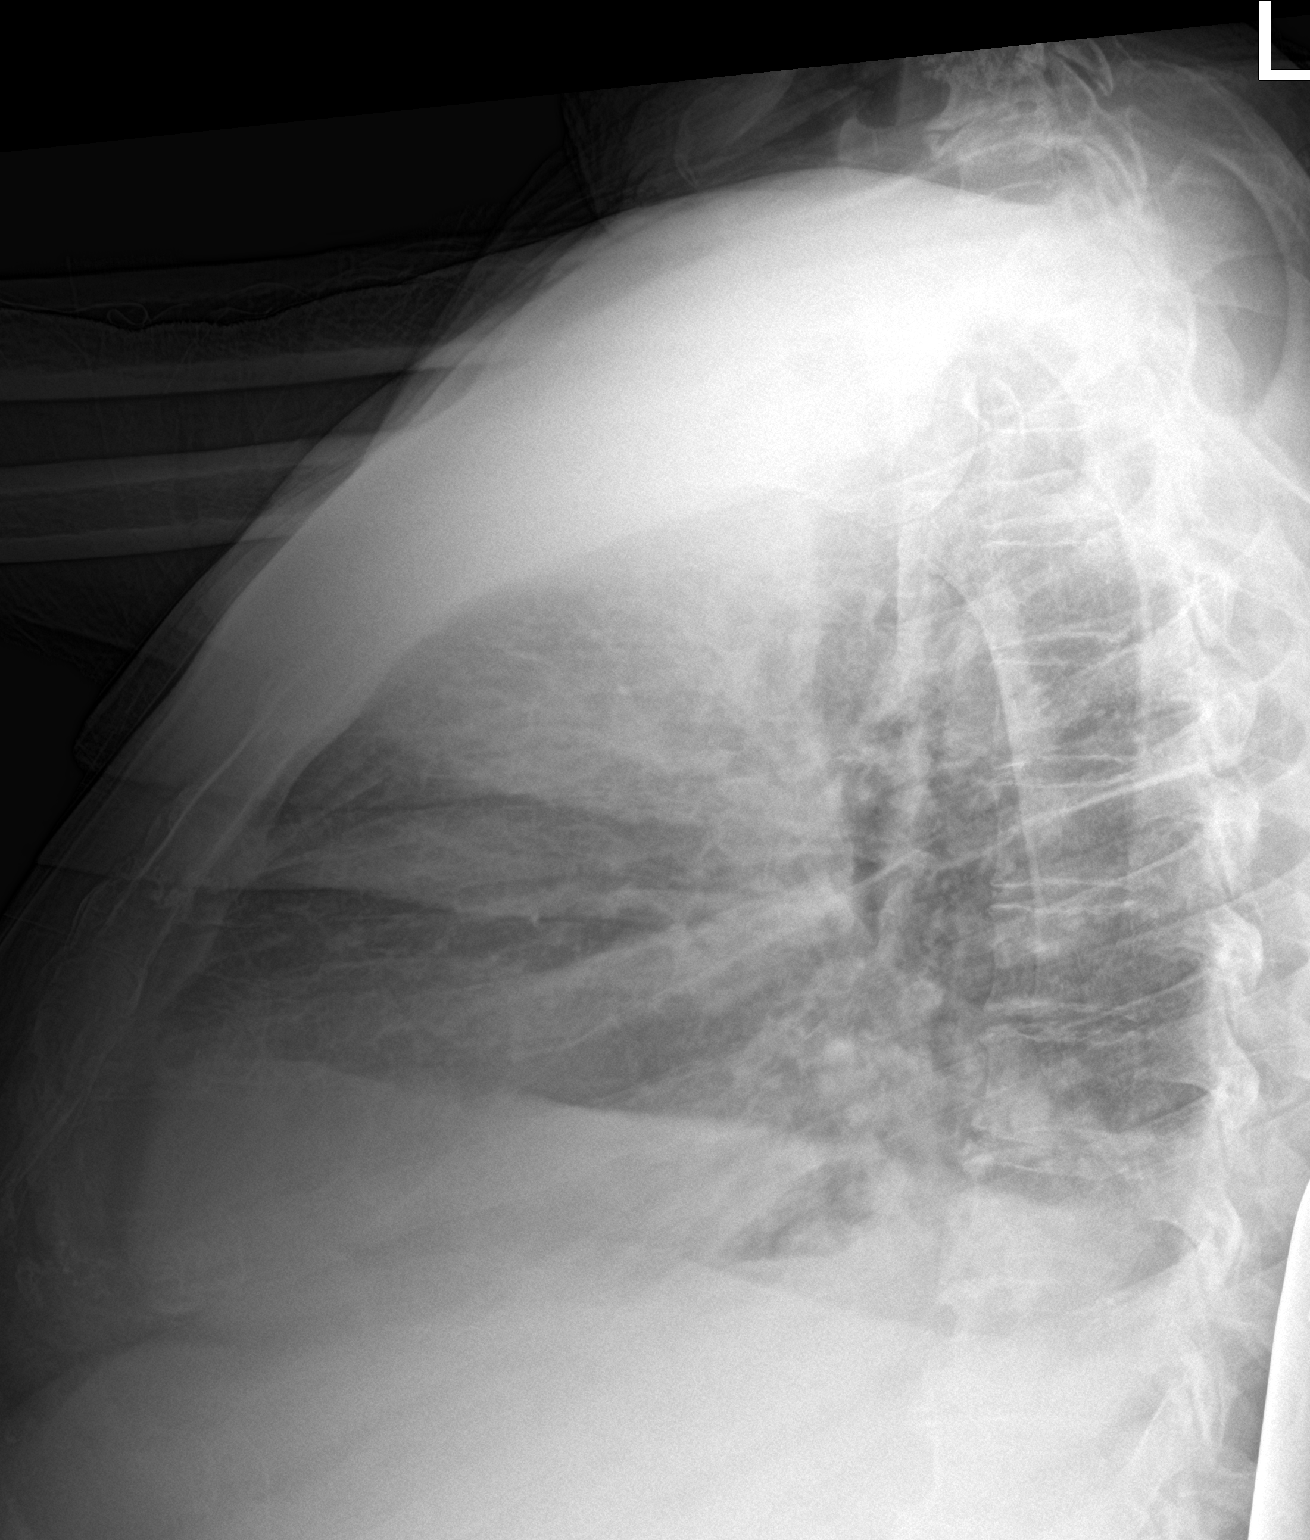

[chest ap]
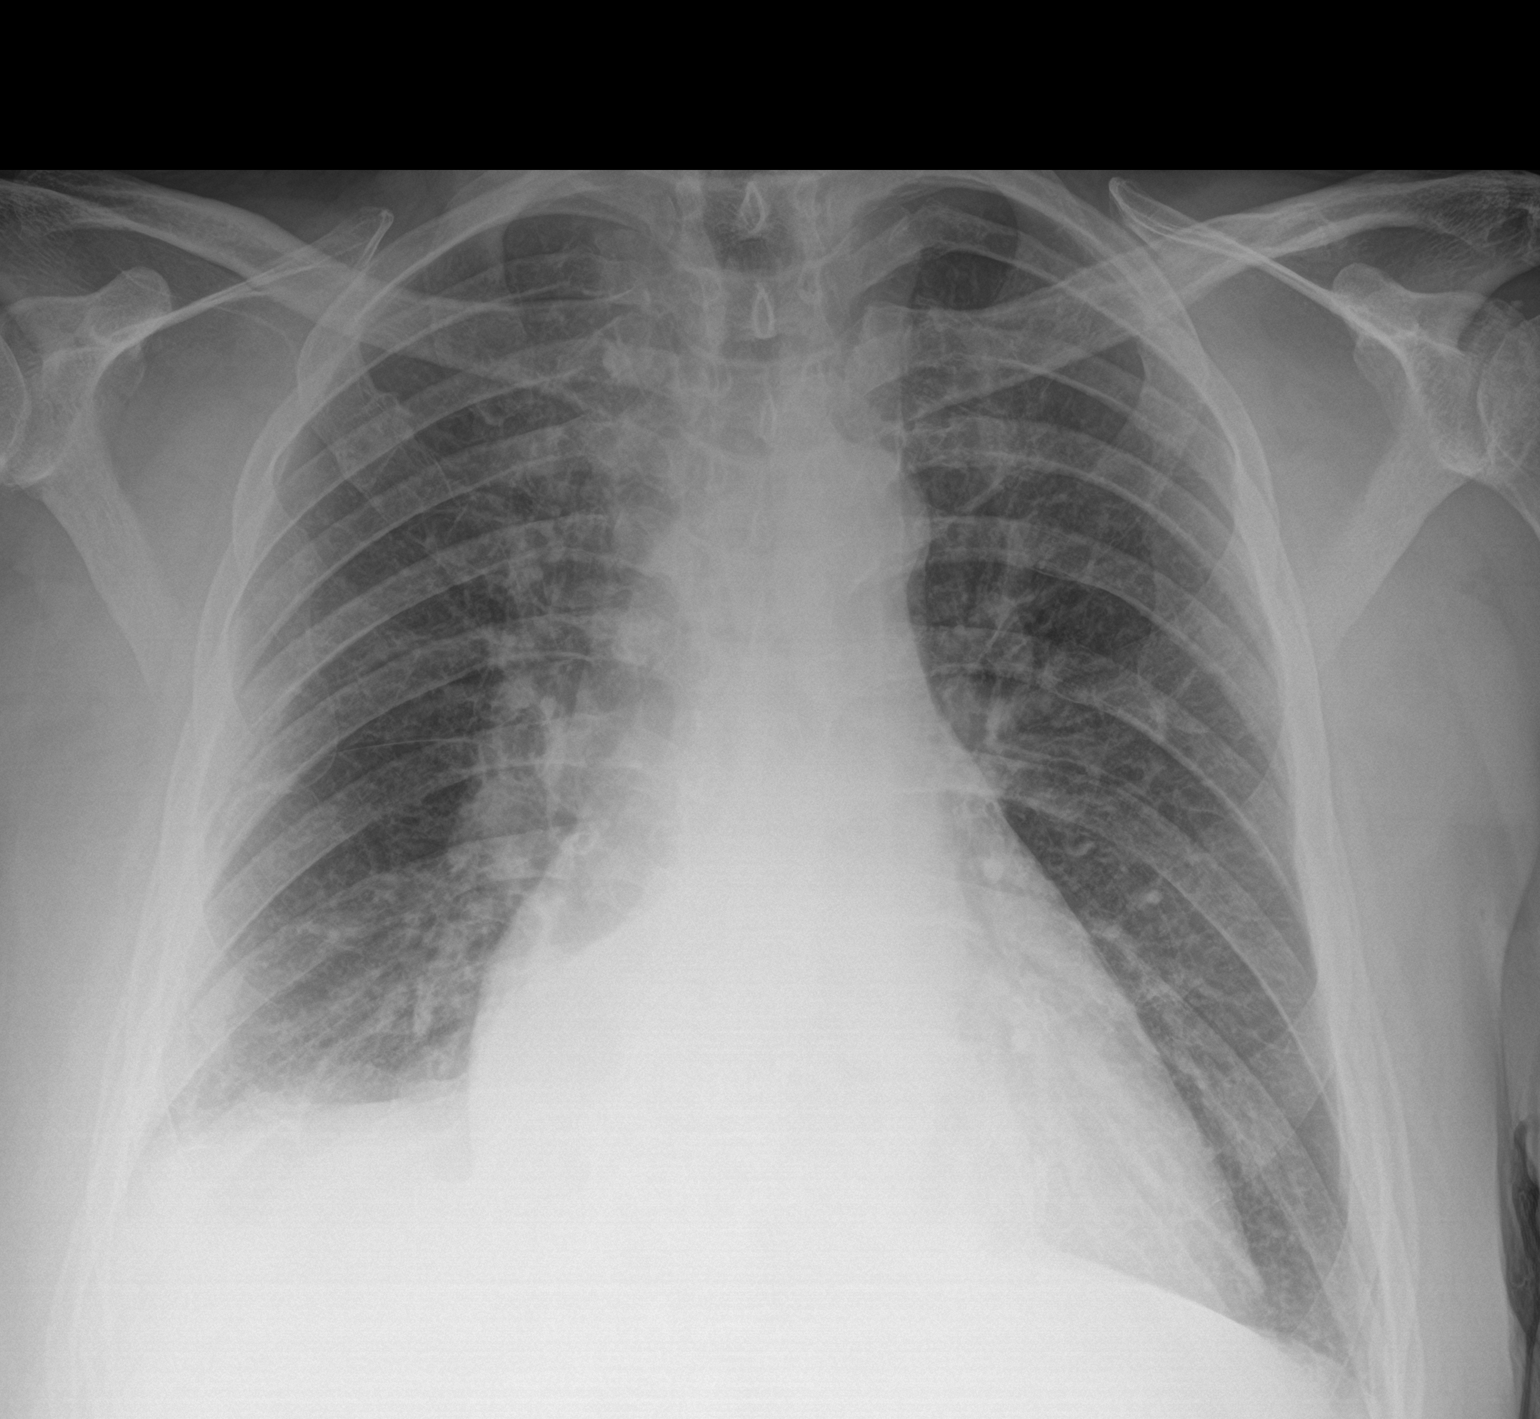

[2 of 2 positions shown; findings below may reference images not displayed]

FINDINGS: Cardiac shadow is within normal limits. The lungs are well aerated
bilaterally. No focal infiltrate is seen. Small right posterior
effusion is noted. No bony abnormality is noted.
IMPRESSION: Small right pleural effusion.  No other focal abnormality is noted.

## 2023-07-12 ENCOUNTER — Inpatient Hospital Stay: Payer: Medicare PPO | Attending: Oncology

## 2023-07-12 VITALS — BP 140/71 | HR 55 | Temp 97.8°F

## 2023-07-12 DIAGNOSIS — Z23 Encounter for immunization: Secondary | ICD-10-CM | POA: Insufficient documentation

## 2023-07-12 DIAGNOSIS — E538 Deficiency of other specified B group vitamins: Secondary | ICD-10-CM | POA: Insufficient documentation

## 2023-07-12 DIAGNOSIS — D509 Iron deficiency anemia, unspecified: Secondary | ICD-10-CM | POA: Diagnosis present

## 2023-07-12 DIAGNOSIS — D500 Iron deficiency anemia secondary to blood loss (chronic): Secondary | ICD-10-CM

## 2023-07-12 MED ORDER — CYANOCOBALAMIN 1000 MCG/ML IJ SOLN
1000.0000 ug | Freq: Once | INTRAMUSCULAR | Status: AC
  Administered 2023-07-12: 1000 ug via INTRAMUSCULAR
  Filled 2023-07-12: qty 1

## 2023-07-12 MED ORDER — INFLUENZA VAC A&B SURF ANT ADJ 0.5 ML IM SUSY
0.5000 mL | PREFILLED_SYRINGE | Freq: Once | INTRAMUSCULAR | Status: AC
  Administered 2023-07-12: 0.5 mL via INTRAMUSCULAR
  Filled 2023-07-12: qty 0.5

## 2023-07-12 NOTE — Patient Instructions (Signed)
Vitamin B12 Injection What is this medication? Vitamin B12 (VAHY tuh min B12) prevents and treats low vitamin B12 levels in your body. It is used in people who do not get enough vitamin B12 from their diet or when their digestive tract does not absorb enough. Vitamin B12 plays an important role in maintaining the health of your nervous system and red blood cells. This medicine may be used for other purposes; ask your health care provider or pharmacist if you have questions. COMMON BRAND NAME(S): B-12 Compliance Kit, B-12 Injection Kit, Cyomin, Dodex, LA-12, Nutri-Twelve, Physicians EZ Use B-12, Primabalt, Vitamin Deficiency Injectable System - B12 What should I tell my care team before I take this medication? They need to know if you have any of these conditions: Kidney disease Leber's disease Megaloblastic anemia An unusual or allergic reaction to cyanocobalamin, cobalt, other medications, foods, dyes, or preservatives Pregnant or trying to get pregnant Breast-feeding How should I use this medication? This medication is injected into a muscle or deeply under the skin. It is usually given in a clinic or care team's office. However, your care team may teach you how to inject yourself. Follow all instructions. Talk to your care team about the use of this medication in children. Special care may be needed. Overdosage: If you think you have taken too much of this medicine contact a poison control center or emergency room at once. NOTE: This medicine is only for you. Do not share this medicine with others. What if I miss a dose? If you are given your dose at a clinic or care team's office, call to reschedule your appointment. If you give your own injections, and you miss a dose, take it as soon as you can. If it is almost time for your next dose, take only that dose. Do not take double or extra doses. What may interact with this medication? Alcohol Colchicine This list may not describe all possible  interactions. Give your health care provider a list of all the medicines, herbs, non-prescription drugs, or dietary supplements you use. Also tell them if you smoke, drink alcohol, or use illegal drugs. Some items may interact with your medicine. What should I watch for while using this medication? Visit your care team regularly. You may need blood work done while you are taking this medication. You may need to follow a special diet. Talk to your care team. Limit your alcohol intake and avoid smoking to get the best benefit. What side effects may I notice from receiving this medication? Side effects that you should report to your care team as soon as possible: Allergic reactions--skin rash, itching, hives, swelling of the face, lips, tongue, or throat Swelling of the ankles, hands, or feet Trouble breathing Side effects that usually do not require medical attention (report to your care team if they continue or are bothersome): Diarrhea This list may not describe all possible side effects. Call your doctor for medical advice about side effects. You may report side effects to FDA at 1-800-FDA-1088. Where should I keep my medication? Keep out of the reach of children. Store at room temperature between 15 and 30 degrees C (59 and 85 degrees F). Protect from light. Throw away any unused medication after the expiration date. NOTE: This sheet is a summary. It may not cover all possible information. If you have questions about this medicine, talk to your doctor, pharmacist, or health care provider.  2024 Elsevier/Gold Standard (2021-06-29 00:00:00) Influenza (Flu) Vaccine (Inactivated or Recombinant): What You  Need to Know Many vaccine information statements are available in Spanish and other languages. See PromoAge.com.br. 1. Why get vaccinated? Influenza vaccine can prevent influenza (flu). Flu is a contagious disease that spreads around the [place] every year, usually between October and  May. Anyone can get the flu, but it is more dangerous for some people. Infants and young children, people 58 years and older, pregnant people, and people with certain health conditions or a weakened immune system are at greatest risk of flu complications. Pneumonia, bronchitis, sinus infections, and ear infections are examples of flu-related complications. If you have a medical condition, such as heart disease, cancer, or diabetes, flu can make it worse. Flu can cause fever and chills, sore throat, muscle aches, fatigue, cough, headache, and runny or stuffy nose. Some people may have vomiting and diarrhea, though this is more common in children than adults. In an average year, thousands of people in the [place] die from flu, and many more are hospitalized. Flu vaccine prevents millions of illnesses and flu-related visits to the doctor each year. 2. Influenza vaccines CDC recommends everyone 6 months and older get vaccinated every flu season. Children 6 months through 25 years of age may need 2 doses during a single flu season. Everyone else needs only 1 dose each flu season. It takes about 2 weeks for protection to develop after vaccination. There are many flu viruses, and they are always changing. Each year a new flu vaccine is made to protect against the influenza viruses believed to be likely to cause disease in the upcoming flu season. Even when the vaccine doesn't exactly match these viruses, it may still provide some protection. Influenza vaccine does not cause flu. Influenza vaccine may be given at the same time as other vaccines. 3. Talk with your health care provider Tell your vaccination provider if the person getting the vaccine: Has had an allergic reaction after a previous dose of influenza vaccine, or has any severe, life-threatening allergies Has ever had Guillain-Barr Syndrome (also called "GBS") In some cases, your health care provider may decide to postpone influenza vaccination  until a future visit. Influenza vaccine can be administered at any time during pregnancy. People who are or will be pregnant during influenza season should receive inactivated influenza vaccine. People with minor illnesses, such as a cold, may be vaccinated. People who are moderately or severely ill should usually wait until they recover before getting influenza vaccine. Your health care provider can give you more information. 4. Risks of a vaccine reaction Soreness, redness, and swelling where the shot is given, fever, muscle aches, and headache can happen after influenza vaccination. There may be a very small increased risk of Guillain-Barr Syndrome (GBS) after inactivated influenza vaccine (the flu shot). Young children who get the flu shot along with pneumococcal vaccine (PCV13) and/or DTaP vaccine at the same time might be slightly more likely to have a seizure caused by fever. Tell your health care provider if a child who is getting flu vaccine has ever had a seizure. People sometimes faint after medical procedures, including vaccination. Tell your provider if you feel dizzy or have vision changes or ringing in the ears. As with any medicine, there is a very remote chance of a vaccine causing a severe allergic reaction, other serious injury, or death. 5. What if there is a serious problem? An allergic reaction could occur after the vaccinated person leaves the clinic. If you see signs of a severe allergic reaction (hives, swelling of  the face and throat, difficulty breathing, a fast heartbeat, dizziness, or weakness), call 9-1-1 and get the person to the nearest hospital. For other signs that concern you, call your health care provider. Adverse reactions should be reported to the Vaccine Adverse Event Reporting System (VAERS). Your health care provider will usually file this report, or you can do it yourself. Visit the VAERS website at www.vaers.LAgents.no or call (780) 213-5824. VAERS is only for  reporting reactions, and VAERS staff members do not give medical advice. 6. The National Vaccine Injury Compensation Program The [employer] Vaccine Injury Compensation Program (VICP) is a federal program that was created to compensate people who may have been injured by certain vaccines. Claims regarding alleged injury or death due to vaccination have a time limit for filing, which may be as short as two years. Visit the VICP website at SpiritualWord.at or call 910-626-1898 to learn about the program and about filing a claim. 7. How can I learn more? Ask your health care provider. Call your local or state health department. Visit the website of the Food and Drug Administration (FDA) for vaccine package inserts and additional information at FinderList.no. Contact the Centers for Disease Control and Prevention (CDC): Call (763)868-6848 (1-800-CDC-INFO) or Visit CDC's website at BiotechRoom.com.cy. Source: CDC Vaccine Information Statement Inactivated Influenza Vaccine (06/05/2020) This same material is available at FootballExhibition.com.br for no charge. This information is not intended to replace advice given to you by your health care provider. Make sure you discuss any questions you have with your health care provider. Document Revised: 02/01/2023 Document Reviewed: 11/07/2022 Elsevier Patient Education  2024 [employer].

## 2023-08-09 ENCOUNTER — Other Ambulatory Visit: Payer: Self-pay

## 2023-08-09 DIAGNOSIS — E538 Deficiency of other specified B group vitamins: Secondary | ICD-10-CM

## 2023-08-09 NOTE — Telephone Encounter (Signed)
Brandy,RN, with [facility] has called on behalf of pt. Pt would like to start receiving his B12 injection @ home, administered by the [facility] nurse.

## 2023-08-11 ENCOUNTER — Encounter: Payer: Self-pay | Admitting: Oncology

## 2023-08-15 ENCOUNTER — Encounter: Payer: Self-pay | Admitting: Oncology

## 2023-08-15 ENCOUNTER — Ambulatory Visit: Payer: Medicare PPO | Admitting: Podiatry

## 2023-08-15 DIAGNOSIS — E1142 Type 2 diabetes mellitus with diabetic polyneuropathy: Secondary | ICD-10-CM | POA: Diagnosis not present

## 2023-08-15 DIAGNOSIS — B351 Tinea unguium: Secondary | ICD-10-CM

## 2023-08-15 DIAGNOSIS — M79675 Pain in left toe(s): Secondary | ICD-10-CM

## 2023-08-15 DIAGNOSIS — I739 Peripheral vascular disease, unspecified: Secondary | ICD-10-CM

## 2023-08-15 DIAGNOSIS — L97521 Non-pressure chronic ulcer of other part of left foot limited to breakdown of skin: Secondary | ICD-10-CM | POA: Diagnosis not present

## 2023-08-15 DIAGNOSIS — M79674 Pain in right toe(s): Secondary | ICD-10-CM

## 2023-08-15 MED ORDER — DOXYCYCLINE HYCLATE 100 MG PO TABS: 100.0000 mg | ORAL_TABLET | Freq: Two times a day (BID) | ORAL | 0 refills | Status: AC

## 2023-08-15 MED ORDER — MUPIROCIN 2 % EX OINT: 1.0000 | TOPICAL_OINTMENT | Freq: Every day | CUTANEOUS | 0 refills | Status: DC

## 2023-08-15 NOTE — Progress Notes (Signed)
Subjective:  Patient ID: Anthony Mueller, male    DOB: 23-Apr-1951,  MRN: [id number]  Chief Complaint  Patient presents with   Foot Problem    RM-3  Left foot - great toe-  has been inflammed and red since last Friday- started oozing this morning. -  feeling well.  No N/V/F/C/NS  Pressure sore- [facility]-  wound nurse told them to get heel protectors.   Nail trim x 10.      72 y.o. male presents with concern for small ulceration present in the distal aspect of the left great toe.  Has been inflamed and red since last Friday.  Denies any nausea vomiting fever chills has seen some drainage from the area.  Also has concern for prior left heel pressure sore has been wearing Prevalon boot on the left side for redistribution and pressure to prevent worsening of the area.  No drainage from that spot just a small scab.  Also unable to trim nails requesting transfer nails due to thickening length and dystrophy.  Past Medical History:  Diagnosis Date   Accelerated hypertension 03/31/2022   Acute diastolic (congestive) heart failure (HCC) 03/31/2022   Acute on chronic anemia 01/13/2022   AKI (acute kidney injury) (HCC) 01/13/2022   At risk for fall due to comorbid condition 01/20/2022   B12 deficiency 03/01/2022   Bilateral lower extremity edema 01/13/2022   Calculus of gallbladder with acute cholecystitis without obstruction 02/28/2022   Cardiomegaly 11/10/2021   Cerebellar degeneration (HCC)    CHF exacerbation (HCC) 03/31/2022   Cholelithiasis 11/10/2021   Chronic diastolic heart failure (HCC) 03/31/2022   Diabetes mellitus without complication (HCC)    DM type 2 with diabetic mixed hyperlipidemia (HCC) 03/04/2020   Does not transfer from wheelchair to toilet 01/20/2022   Drug therapy 02/02/2016   Elevated antinuclear antibody (ANA) level 10/06/2015   Gait disorder 09/07/2016   GERD (gastroesophageal reflux disease)    Gout 08/28/2012   History of 2019 novel coronavirus disease (COVID-19)  10/27/2021   History of colonic polyps 11/14/2011   History of esophageal stricture 05/02/2018   Hypercholesteremia 03/31/2022   Hypercoagulable state due to persistent atrial fibrillation (HCC) 12/16/2021   Hyperlipidemia    Hypertension    Hypertension, essential, benign 08/28/2012   Hypertensive heart disease 01/05/2022   Hypertensive heart disease with acute on chronic diastolic congestive heart failure (HCC) 03/31/2022   Hypertensive urgency 03/31/2022   Hypokalemia 11/10/2021   Hypomagnesemia 01/13/2022   Intertrigo 09/03/2018   Iron deficiency anemia due to chronic blood loss 03/01/2022   Long term (current) use of anticoagulants 12/16/2021   Malaise and fatigue 03/31/2022   Muscular deconditioning 12/23/2021   Occult GI bleeding 11/06/2017   OSA (obstructive sleep apnea) 01/13/2022   Formatting of this note might be different from the original. WEARS CPAP   Persistent atrial fibrillation (HCC) 11/13/2021   Formatting of this note might be different from the original. Last Assessment & Plan:  Formatting of this note might be different from the original. - Patient noted to be in atrial fibrillation, bradycardia with sinus pauses -Per cardiology, started on apixaban, will need to be stopped prior to his upcoming rectal surgery (stop 5 days prior to the surgery and then resume once cleared by surgery se   Positive ANA (antinuclear antibody) 10/06/2015   Postoperative anemia due to acute blood loss 01/20/2022   Postoperative examination 02/28/2022   Pulmonary edema 03/31/2022   Rectal bleeding 01/14/2022   RLS (restless legs syndrome)  09/08/2016   Shortness of breath 03/31/2022   Skin cancer    Transaminitis 11/10/2021   Trigger middle finger of left hand 10/07/2015   Type 2 diabetes mellitus with hyperlipidemia (HCC) 11/10/2021   Unspecified atrial fibrillation (HCC) with sinus pauses 11/13/2021    Allergies  Allergen Reactions   Victoza [Liraglutide]     CAN NOT TAKE BECAUSE A SIDE EFFECT IS PANCREATITIS AND  HE HAS A HISTORY Contraindicated due to pancreatitis    Hydrocodone     MAKES FEEL SPACEY AND LIGHT HEADED   Hydrocodone-Acetaminophen     Feels " orbity"   Other     BANDAID  CAUSES RASH   Tape Other (See Comments)    Skin irritation, welts   Latex Rash    Band-aids   Metronidazole Nausea And Vomiting     GI Upset (intolerance) AND DEPRESSION ;  TABLETS Does fine w/ IV Flagyl     ROS: Negative except as per HPI above  Objective:  General: AAO x3, NAD  Dermatological: Nails dystrophic growth x 5 bilateral foot with elongation and thickening discoloration.  Left hallux with erythema at the distal aspect of the toe.  No underlying abscess identified.  Small superficial ulceration to the level of limited to breakdown of skin measuring 0.7 x 0.8 x 0.1 cm postdebridement.  No probe to bone.  Left heel with a small 2 to 3 mm eschar present at the posterior heel with no significant ulceration or pressure injury identified.    Vascular:  Dorsalis Pedis artery and Posterior Tibial artery pedal pulses are 1 out of 4 bilateral edema noted to bilateral foot and ankle  Neruologic: Grossly diminished to light touch and protective sensation absent at the toes of the left foot  Musculoskeletal: Absent lower extremity strength in the left lower extremity.  Patient wheelchair-bound Gait: Wheelchair bound  No images are attached to the encounter.  Radiographs:  Deferred due to superficial nature of ulceration Assessment:   1. Ulcer of left foot, limited to breakdown of skin (HCC)   2. PVD (peripheral vascular disease) (HCC)   3. Pain due to onychomycosis of toenails of both feet   4. DM type 2 with diabetic peripheral neuropathy (HCC)      Plan:  Patient was evaluated and treated and all questions answered.  # Ulcer of left hallux distal aspect limited to breakdown of skin with mild superficial cellulitis surrounding  -We discussed the etiology and factors that are a part of the  wound healing process.  We also discussed the risk of infection both soft tissue and osteomyelitis from open ulceration.  Discussed the risk of limb loss if this happens or worsens. -Debridement as below. -Dressed with antibiotic ointment, DSD. -Continue home dressing changes daily with mupirocin ointment and Band-Aid or gauze dressing -Continue off-loading with Prevalon boot to left foot no shoe on the left foot -Vascular testing deferred for now -HgbA1c: 7.1 -Last antibiotics: E Rx for doxycycline 100 mg twice daily for 10 days due to cellulitis of the left hallux -Imaging: Deferred due to superficial nature of the ulceration  Procedure: Excisional Debridement of Wound Rationale: Removal of non-viable soft tissue from the wound to promote healing.  Anesthesia: none Post-Debridement Wound Measurements: 0.8 cm x 0.7 cm x 0.1 cm  Type of Debridement: Sharp Excisional Tissue Removed: Non-viable soft tissue Depth of Debridement: subcutaneous tissue. Technique: Sharp excisional debridement to bleeding, viable wound base.  Dressing: Dry, sterile, compression dressing. Disposition: Patient tolerated procedure well.     #  Onychomycosis with pain  -Nails palliatively debrided as below. -Educated on self-care  Procedure: Nail Debridement Rationale: Pain Type of Debridement: manual, sharp debridement. Instrumentation: Nail nipper, rotary burr. Number of Nails: 10  Return in about 3 weeks (around 09/05/2023) for f/u L hallux ulcer.          Corinna Gab, DPM [facility] / [facility]

## 2023-08-16 ENCOUNTER — Ambulatory Visit: Payer: Medicare PPO

## 2023-09-05 ENCOUNTER — Ambulatory Visit: Payer: Medicare PPO | Admitting: Podiatry

## 2023-09-05 ENCOUNTER — Encounter: Payer: Self-pay | Admitting: Podiatry

## 2023-09-05 DIAGNOSIS — I739 Peripheral vascular disease, unspecified: Secondary | ICD-10-CM

## 2023-09-05 DIAGNOSIS — E1142 Type 2 diabetes mellitus with diabetic polyneuropathy: Secondary | ICD-10-CM

## 2023-09-05 DIAGNOSIS — M21372 Foot drop, left foot: Secondary | ICD-10-CM

## 2023-09-05 DIAGNOSIS — M21371 Foot drop, right foot: Secondary | ICD-10-CM

## 2023-09-05 DIAGNOSIS — L97521 Non-pressure chronic ulcer of other part of left foot limited to breakdown of skin: Secondary | ICD-10-CM

## 2023-09-05 NOTE — Progress Notes (Signed)
Patient presents to the office today for diabetic shoe and insole measuring.  Patient was measured with brannock device to determine size and width for 1 pair of extra depth shoes and foam casted for 3 pair of insoles.   Documentation of medical necessity will be sent to patient's treating diabetic doctor to verify and sign.   Patient's diabetic provider: Arlan Organ MD   Shoes and insoles will be ordered at that time and patient will be notified for an appointment for fitting when they arrive.   Shoe size (per patient): 11.5 Brannock measurement: 11 Patient shoe selection- Shoe choice:   [id number] will wait if on BO  Shoe size ordered: 11.5 XWD [employer] signed  Addison Bailey Cped, CFo, CFm

## 2023-09-05 NOTE — Progress Notes (Signed)
Subjective:  Patient ID: Anthony Mueller, male    DOB: [birth year],  MRN: [id number]  Chief Complaint  Patient presents with   Diabetic Ulcer    Left Hallux. Stable appearing today. Continuing with applications of antibiotic ointment. Is uncovered today. Also requesting evaluation of slow healing lesion to left posterior heel. Using home offloading boots.    72 y.o. male presents for follow-up of left hallux ulceration.  Also concerned about area in the back of his left heel.  Previously recommended antibiotic ointment applied daily to the lesion on the toe.  Thinks the wound is improved.  Past Medical History:  Diagnosis Date   Accelerated hypertension 03/31/2022   Acute diastolic (congestive) heart failure (HCC) 03/31/2022   Acute on chronic anemia 01/13/2022   AKI (acute kidney injury) (HCC) 01/13/2022   At risk for fall due to comorbid condition 01/20/2022   B12 deficiency 03/01/2022   Bilateral lower extremity edema 01/13/2022   Calculus of gallbladder with acute cholecystitis without obstruction 02/28/2022   Cardiomegaly 11/10/2021   Cerebellar degeneration (HCC)    CHF exacerbation (HCC) 03/31/2022   Cholelithiasis 11/10/2021   Chronic diastolic heart failure (HCC) 03/31/2022   Diabetes mellitus without complication (HCC)    DM type 2 with diabetic mixed hyperlipidemia (HCC) 03/04/2020   Does not transfer from wheelchair to toilet 01/20/2022   Drug therapy 02/02/2016   Elevated antinuclear antibody (ANA) level 10/06/2015   Gait disorder 09/07/2016   GERD (gastroesophageal reflux disease)    Gout 08/28/2012   History of 2019 novel coronavirus disease (COVID-19) 10/27/2021   History of colonic polyps 11/14/2011   History of esophageal stricture 05/02/2018   Hypercholesteremia 03/31/2022   Hypercoagulable state due to persistent atrial fibrillation (HCC) 12/16/2021   Hyperlipidemia    Hypertension    Hypertension, essential, benign 08/28/2012   Hypertensive heart disease 01/05/2022   Hypertensive  heart disease with acute on chronic diastolic congestive heart failure (HCC) 03/31/2022   Hypertensive urgency 03/31/2022   Hypokalemia 11/10/2021   Hypomagnesemia 01/13/2022   Intertrigo 09/03/2018   Iron deficiency anemia due to chronic blood loss 03/01/2022   Long term (current) use of anticoagulants 12/16/2021   Malaise and fatigue 03/31/2022   Muscular deconditioning 12/23/2021   Occult GI bleeding 11/06/2017   OSA (obstructive sleep apnea) 01/13/2022   Formatting of this note might be different from the original. WEARS CPAP   Persistent atrial fibrillation (HCC) 11/13/2021   Formatting of this note might be different from the original. Last Assessment & Plan:  Formatting of this note might be different from the original. - Patient noted to be in atrial fibrillation, bradycardia with sinus pauses -Per cardiology, started on apixaban, will need to be stopped prior to his upcoming rectal surgery (stop 5 days prior to the surgery and then resume once cleared by surgery se   Positive ANA (antinuclear antibody) 10/06/2015   Postoperative anemia due to acute blood loss 01/20/2022   Postoperative examination 02/28/2022   Pulmonary edema 03/31/2022   Rectal bleeding 01/14/2022   RLS (restless legs syndrome) 09/08/2016   Shortness of breath 03/31/2022   Skin cancer    Transaminitis 11/10/2021   Trigger middle finger of left hand 10/07/2015   Type 2 diabetes mellitus with hyperlipidemia (HCC) 11/10/2021   Unspecified atrial fibrillation (HCC) with sinus pauses 11/13/2021    Allergies  Allergen Reactions   Victoza [Liraglutide]     CAN NOT TAKE BECAUSE A SIDE EFFECT IS PANCREATITIS AND HE HAS A HISTORY Contraindicated due  to pancreatitis    Hydrocodone     MAKES FEEL SPACEY AND LIGHT HEADED   Hydrocodone-Acetaminophen     Feels " orbity"   Other     BANDAID  CAUSES RASH   Tape Other (See Comments)    Skin irritation, welts   Latex Rash    Band-aids   Metronidazole Nausea And Vomiting     GI Upset  (intolerance) AND DEPRESSION ;  TABLETS Does fine w/ IV Flagyl     ROS: Negative except as per HPI above  Objective:  General: AAO x3, NAD  Dermatological: Decreased edema and erythema of the left hallux.  The area of wound has small eschar present on the distal tuft of the toe.  Upon debridement there is no underlying ulceration the wound is healed in.  At the posterior aspect of the left heel there is small eschar which upon debridement there is a very superficial ulceration approximately 1 mm x 2 mm no erythema or drainage.    Vascular:  Dorsalis Pedis artery and Posterior Tibial artery pedal pulses are 1 out of 4 bilateral edema noted to bilateral foot and ankle  Neruologic: Grossly diminished to light touch and protective sensation absent at the toes of the left foot  Musculoskeletal: Absent lower extremity strength in the left lower extremity.  Patient wheelchair-bound Gait: Wheelchair bound  No images are attached to the encounter.  Radiographs:  Deferred due to superficial nature of ulceration Assessment:   1. Ulcer of left foot, limited to breakdown of skin (HCC)   2. Foot drop, bilateral   3. PVD (peripheral vascular disease) (HCC)   4. DM type 2 with diabetic peripheral neuropathy (HCC)       Plan:  Patient was evaluated and treated and all questions answered.  # Ulcer of left hallux distal aspect limited to breakdown of skin with mild superficial cellulitis surrounding -Fully healed at this time -Discontinue antibiotic ointment protect with sock and Prevalon boot -DM shoes  # Posterior heel left wound very superficial 0.1 x 0.2 x 0.1 cm -Continue Prevalon boot -Antibiotic ointment and Band-Aid applied to the area continue this for another 1 to 2 weeks until area is fully healed then  # Ulceration and dropfoot/lower extremity weakness with neuropathy secondary to diabetes mellitus -Recommend diabetic shoes and liners.  Patient will be seen later this  afternoon by our pedorthist for fitting of diabetic shoes.  Return in about 9 weeks (around 11/07/2023) for [facility].          Corinna Gab, DPM [facility] / [facility]

## 2023-09-13 ENCOUNTER — Ambulatory Visit: Payer: Medicare PPO

## 2023-10-18 ENCOUNTER — Ambulatory Visit: Payer: Medicare PPO

## 2023-10-31 ENCOUNTER — Encounter: Payer: Self-pay | Admitting: Cardiology

## 2023-10-31 NOTE — Progress Notes (Signed)
 Cardiology Office Note:    Date:  11/02/2023   ID:  Anthony Mueller, Anthony Mueller 1951-10-16, MRN [id number]  PCP:  Magdaline Debby HERO, MD  Cardiologist:  Redell Leiter, MD    Referring MD: Magdaline Debby HERO, MD    ASSESSMENT:    1. Persistent atrial fibrillation (HCC)   2. Hypertensive heart disease with chronic diastolic congestive heart failure (HCC)   3. VT (ventricular tachycardia) (HCC)   4. Hypomagnesemia    PLAN:    In order of problems listed above:  Continues with rate control with atrial fibrillation continue beta-blocker and he is not anticoagulated major hemorrhagic complication will continue low-dose aspirin   improved take an extra diuretic 1 day a week continue his current antihypertensives including ARB and MRA Improved no recurrence once we corrected his magnesium  deficiency Check magnesium  level BMP today   Next appointment: 1 year   Medication Adjustments/Labs and Tests Ordered: Current medicines are reviewed at length with the patient today.  Concerns regarding medicines are outlined above.  No orders of the defined types were placed in this encounter.  No orders of the defined types were placed in this encounter.    History of Present Illness:    Anthony Mueller is a 72 y.o. male with a hx of persistent atrial fibrillation no longer anticoagulated because of GI bleeding and anemia hypertensive heart disease with heart failure in the setting of severe anemia ventricular tachycardia with hypomagnesemia iron  deficiency anemia cerebellar degeneration and myelopathy last seen 03/24/2023.  Following the visit he had a renal vascular arterial duplex performed showing no evidence of renal artery stenosis.  Compliance with diet, lifestyle and medications: Yes  Sodium 134 potassium 4.5 creatinine 1.24 Recent labs 07/14/2023 hemoglobin 12.9 platelets 178,000 Magnesium  1.1 04/07/2022  His wife thinks he is more edematous he will take an extra dose of his diuretic  on Saturday total 4 days a week No palpitation chest pain orthopnea or syncope He continues on magnesium  supplement we will recheck a level today Blood pressure is improved blood pressures are running predominantly 1 4150 systolic Past Medical History:  Diagnosis Date   Accelerated hypertension 03/31/2022   Acute diastolic (congestive) heart failure (HCC) 03/31/2022   Acute on chronic anemia 01/13/2022   AKI (acute kidney injury) (HCC) 01/13/2022   At risk for fall due to comorbid condition 01/20/2022   B12 deficiency 03/01/2022   Bilateral lower extremity edema 01/13/2022   Calculus of gallbladder with acute cholecystitis without obstruction 02/28/2022   Cardiomegaly 11/10/2021   Cerebellar degeneration (HCC)    CHF exacerbation (HCC) 03/31/2022   Cholelithiasis 11/10/2021   Chronic diastolic heart failure (HCC) 03/31/2022   Chronic suprapubic catheter (HCC) 12/08/2022   [facility]     Diabetes mellitus without complication (HCC)    DM type 2 with diabetic mixed hyperlipidemia (HCC) 03/04/2020   Does not transfer from wheelchair to toilet 01/20/2022   Drug therapy 02/02/2016   Elevated antinuclear antibody (ANA) level 10/06/2015   Frail elderly 04/07/2022   Gait disorder 09/07/2016   GERD (gastroesophageal reflux disease)    Gout 08/28/2012   History of 2019 novel coronavirus disease (COVID-19) 10/27/2021   History of colonic polyps 11/14/2011   History of esophageal stricture 05/02/2018   Hypercholesteremia 03/31/2022   Hypercoagulable state due to persistent atrial fibrillation (HCC) 12/16/2021   Hyperlipidemia    Hypertension    Hypertension, essential, benign 08/28/2012   Hypertensive heart disease 01/05/2022   Hypertensive heart disease with acute on chronic diastolic  congestive heart failure (HCC) 03/31/2022   Hypertensive urgency 03/31/2022   Hypokalemia 11/10/2021   Hypomagnesemia 01/13/2022   Intertrigo 09/03/2018   Iron  deficiency anemia due to chronic  blood loss 03/01/2022   Long term (current) use of anticoagulants 12/16/2021   Malaise and fatigue 03/31/2022   Muscular deconditioning 12/23/2021   Obesity (BMI 30-39.9) 03/15/2023   Occult GI bleeding 11/06/2017   OSA (obstructive sleep apnea) 01/13/2022   Formatting of this note might be different from the original. WEARS CPAP   Persistent atrial fibrillation (HCC) 11/13/2021   Formatting of this note might be different from the original. Last Assessment & Plan:  Formatting of this note might be different from the original. - Patient noted to be in atrial fibrillation, bradycardia with sinus pauses -Per cardiology, started on apixaban , will need to be stopped prior to his upcoming rectal surgery (stop 5 days prior to the surgery and then resume once cleared by surgery se   Positive ANA (antinuclear antibody) 10/06/2015   Postoperative anemia due to acute blood loss 01/20/2022   Postoperative examination 02/28/2022   Pulmonary edema 03/31/2022   Rectal bleeding 01/14/2022   RLS (restless legs syndrome) 09/08/2016   Shortness of breath 03/31/2022   Skin cancer    Transaminitis 11/10/2021   Trigger middle finger of left hand 10/07/2015   Type 2 diabetes mellitus with hyperlipidemia (HCC) 11/10/2021   Uninhibited neuropathic bladder, 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   acetaminophen  (TYLENOL ) 500 MG tablet Take 500-1,000 mg by mouth every 8 (eight) hours as needed for moderate pain or headache.   albuterol  (VENTOLIN  HFA) 108 (90 Base) MCG/ACT inhaler Inhale 2 puffs into the lungs every 6 (six) hours as needed for wheezing or shortness of breath.   allopurinol  (ZYLOPRIM ) 100 MG tablet Take 100 mg by mouth in the morning.   Ascorbic Acid  (VITAMIN C) 1000 MG tablet Take  1,000 mg by mouth daily.   aspirin  EC 81 MG tablet Take 81 mg by mouth daily.   B-D 3CC LUER-LOK SYR 25GX1 25G X 1 3 ML MISC 1 Syringe by Other route See admin instructions.   carvedilol  (COREG ) 25 MG tablet Take 25 mg by mouth 2 (two) times daily.   cloNIDine  (CATAPRES ) 0.3 MG tablet Take 0.3 mg by mouth 3 (three) times daily.   cyanocobalamin  (VITAMIN B12) 1000 MCG/ML injection Inject 1,000 mcg into the muscle every 30 (thirty) days.   finasteride (PROSCAR) 5 MG tablet Take 5 mg by mouth daily.   furosemide  (LASIX ) 40 MG tablet Take 40 mg by mouth every Monday, Wednesday, and Friday.   hydrALAZINE  (APRESOLINE ) 25 MG tablet Take 50 mg by mouth in the morning, at noon, and at bedtime.   isosorbide  dinitrate (ISORDIL ) 10 MG tablet Take 10 mg by mouth 3 (three) times daily.   losartan  (COZAAR ) 100 MG tablet Take 1 tablet by mouth at bedtime.   lovastatin (MEVACOR) 40 MG tablet Take 40 mg by mouth every evening.   Magnesium  Glycinate 665 MG CAPS Take 665 mg by mouth daily.   metFORMIN (GLUCOPHAGE) 500 MG tablet Take 1,000 mg by mouth 2 (two) times daily.   mupirocin  ointment (BACTROBAN ) 2 % Apply 1 Application topically daily.   nystatin (MYCOSTATIN/NYSTOP) powder Apply topically daily.   omeprazole (PRILOSEC) 20  MG capsule Take 20 mg by mouth in the morning and at bedtime.   oxaprozin (DAYPRO) 600 MG tablet Take 1,200 mg by mouth daily as needed (Gout).   potassium chloride  SA (KLOR-CON  M15) 15 MEQ tablet Take 15 mEq by mouth every Monday, Wednesday, and Friday.   SLOW RELEASE IRON  45 MG TBCR Take 45 mg by mouth every evening.   spironolactone (ALDACTONE) 25 MG tablet Take 25 mg by mouth every Monday, Wednesday, and Friday.   timolol  (TIMOPTIC ) 0.5 % ophthalmic solution Place 1 drop into both eyes daily.   TRADJENTA  5 MG TABS tablet Take 5 mg by mouth daily.   zinc  gluconate 50 MG tablet Take 50 mg by mouth daily.      EKGs/Labs/Other Studies Reviewed:    The following studies were  reviewed today:  Cardiac Studies & Procedures      ECHOCARDIOGRAM  ECHOCARDIOGRAM COMPLETE 11/11/2021  Narrative ECHOCARDIOGRAM REPORT    Patient Name:   DAREK EIFLER Date of Exam: 11/11/2021 Medical Rec #:  [id number]      Height:       73.0 in Accession #:    [id number]     Weight:       250.0 lb Date of Birth:  May 29, 1951      BSA:          2.366 m Patient Age:    72 years       BP:           137/81 mmHg Patient Gender: M              HR:           72 bpm. Exam Location:  Inpatient  Procedure: 2D Echo  Indications:    Cardiomegaly  History:        Patient has no prior history of Echocardiogram examinations. Risk Factors:Diabetes.  Sonographer:    Elida Casey Referring Phys: [id number] RONDELL A SMITH  IMPRESSIONS   1. Left ventricular ejection fraction, by estimation, is 55 to 60%. The left ventricle has normal function. The left ventricle has no regional wall motion abnormalities. There is mild left ventricular hypertrophy. Left ventricular diastolic parameters were normal. 2. Right ventricular systolic function is normal. The right ventricular size is normal. There is mildly elevated pulmonary artery systolic pressure. The estimated right ventricular systolic pressure is 39.6 mmHg. 3. Left atrial size was moderately dilated. 4. Right atrial size was mildly dilated. 5. The mitral valve is normal in structure. Mild mitral valve regurgitation. No evidence of mitral stenosis. 6. The aortic valve is tricuspid. Aortic valve regurgitation is trivial. Aortic valve sclerosis/calcification is present, without any evidence of aortic stenosis. 7. The inferior vena cava is dilated in size with <50% respiratory variability, suggesting right atrial pressure of 15 mmHg.  FINDINGS Left Ventricle: Left ventricular ejection fraction, by estimation, is 55 to 60%. The left ventricle has normal function. The left ventricle has no regional wall motion abnormalities. The left ventricular  internal cavity size was normal in size. There is mild left ventricular hypertrophy. Left ventricular diastolic parameters were normal.  Right Ventricle: The right ventricular size is normal. No increase in right ventricular wall thickness. Right ventricular systolic function is normal. There is mildly elevated pulmonary artery systolic pressure. The tricuspid regurgitant velocity is 2.48 m/s, and with an assumed right atrial pressure of 15 mmHg, the estimated right ventricular systolic pressure is 39.6 mmHg.  Left Atrium: Left atrial size was moderately dilated.  Right Atrium: Right atrial  size was mildly dilated.  Pericardium: Trivial pericardial effusion is present.  Mitral Valve: The mitral valve is normal in structure. Mild mitral valve regurgitation. No evidence of mitral valve stenosis.  Tricuspid Valve: The tricuspid valve is normal in structure. Tricuspid valve regurgitation is trivial.  Aortic Valve: The aortic valve is tricuspid. Aortic valve regurgitation is trivial. Aortic valve sclerosis/calcification is present, without any evidence of aortic stenosis. Aortic valve peak gradient measures 6.1 mmHg.  Pulmonic Valve: The pulmonic valve was normal in structure. Pulmonic valve regurgitation is not visualized.  Aorta: The aortic root is normal in size and structure.  Venous: The inferior vena cava is dilated in size with less than 50% respiratory variability, suggesting right atrial pressure of 15 mmHg.  IAS/Shunts: No atrial level shunt detected by color flow Doppler.   LEFT VENTRICLE PLAX 2D LVIDd:         5.25 cm   Diastology LVIDs:         3.40 cm   LV e' medial:    8.99 cm/s LV PW:         1.50 cm   LV E/e' medial:  12.0 LV IVS:        1.20 cm   LV e' lateral:   9.43 cm/s LVOT diam:     2.10 cm   LV E/e' lateral: 11.5 LV SV:         92 LV SV Index:   39 LVOT Area:     3.46 cm   RIGHT VENTRICLE             IVC RV Basal diam:  3.50 cm     IVC diam: 2.60 cm RV S  prime:     10.30 cm/s TAPSE (M-mode): 1.6 cm  LEFT ATRIUM              Index        RIGHT ATRIUM           Index LA diam:        4.10 cm  1.73 cm/m   RA Area:     20.00 cm LA Vol (A2C):   114.0 ml 48.19 ml/m  RA Volume:   57.10 ml  24.14 ml/m LA Vol (A4C):   95.1 ml  40.20 ml/m LA Biplane Vol: 106.0 ml 44.81 ml/m AORTIC VALVE                 PULMONIC VALVE AV Area (Vmax): 3.25 cm     PV Vmax:       0.74 m/s AV Vmax:        123.50 cm/s  PV Peak grad:  2.2 mmHg AV Peak Grad:   6.1 mmHg LVOT Vmax:      116.00 cm/s LVOT Vmean:     76.700 cm/s LVOT VTI:       0.266 m  AORTA Ao Root diam: 3.70 cm Ao Asc diam:  3.30 cm  MITRAL VALVE                TRICUSPID VALVE MV Area (PHT): 7.82 cm     TR Peak grad:   24.6 mmHg MV Decel Time: 97 msec      TR Vmax:        248.00 cm/s MR Peak grad: 95.6 mmHg MR Vmax:      489.00 cm/s   SHUNTS MV E velocity: 108.00 cm/s  Systemic VTI:  0.27 m MV A velocity: 82.70 cm/s   Systemic Diam: 2.10 cm MV  E/A ratio:  1.31  Dalton McleanMD Electronically signed by Ezra Kanner Signature Date/Time: 11/11/2021/5:04:34 PM    Final   MONITORS  LONG TERM MONITOR (3-14 DAYS) 01/20/2022  Narrative Patch Wear Time:  7 days and 16 hours (2023-03-08T14:39:55-0500 to 2023-03-16T08:07:36-0400)  Atrial Fibrillation occurred continuously (100% burden), ranging from 40-134 bpm (avg of 76 bpm).  Good heart rate control is seen with ventricular rate 50 to 110 bpm 99% of the time during the day and 98% at night.  There were no pauses of 3 seconds or greater  Isolated VEs were rare (<1.0%), VE Couplets were rare (<1.0%), and no VE Triplets were present.  There were no symptomatic or diary events               Recent Labs: 05/15/2023: ALT 20; BUN 24; Creatinine 1.37; Hemoglobin 11.9; Platelet Count 189; Potassium 4.3; Sodium 139  Recent Lipid Panel No results found for: CHOL, TRIG, HDL, CHOLHDL, VLDL, LDLCALC, LDLDIRECT  Physical Exam:     VS:  BP (!) 144/78   Pulse 63   SpO2 95%     Wt Readings from Last 3 Encounters:  05/03/23 259 lb 14.4 oz (117.9 kg)  04/05/23 264 lb 4.8 oz (119.9 kg)  03/24/23 252 lb (114.3 kg)     GEN:  Well nourished, well developed in no acute distress HEENT: Normal NECK: No JVD; No carotid bruits LYMPHATICS: No lymphadenopathy CARDIAC: Irregular rate and rhythm RRR, no murmurs, rubs, gallops RESPIRATORY:  Clear to auscultation without rales, wheezing or rhonchi  ABDOMEN: Soft, non-tender, non-distended MUSCULOSKELETAL:  No edema; No deformity  SKIN: Warm and dry NEUROLOGIC:  Alert and oriented x 3 PSYCHIATRIC:  Normal affect    Signed, Redell Leiter, MD  11/02/2023 1:32 PM    [facility]

## 2023-11-02 ENCOUNTER — Ambulatory Visit: Payer: Medicare PPO | Admitting: Cardiology

## 2023-11-02 ENCOUNTER — Encounter: Payer: Self-pay | Admitting: Cardiology

## 2023-11-02 ENCOUNTER — Encounter: Payer: Self-pay | Admitting: Oncology

## 2023-11-02 ENCOUNTER — Ambulatory Visit: Payer: Medicare PPO | Attending: Cardiology | Admitting: Cardiology

## 2023-11-02 VITALS — BP 144/78 | HR 63

## 2023-11-02 DIAGNOSIS — I110 Hypertensive heart disease with heart failure: Secondary | ICD-10-CM | POA: Diagnosis not present

## 2023-11-02 DIAGNOSIS — I472 Ventricular tachycardia, unspecified: Secondary | ICD-10-CM | POA: Diagnosis not present

## 2023-11-02 DIAGNOSIS — I4819 Other persistent atrial fibrillation: Secondary | ICD-10-CM

## 2023-11-02 DIAGNOSIS — I5032 Chronic diastolic (congestive) heart failure: Secondary | ICD-10-CM

## 2023-11-02 NOTE — Patient Instructions (Addendum)
 Medication Instructions:   TAKE: Dose of Lasix  On Saturday also   Lab Work: BMP, Magnesium  If you have labs (blood work) drawn today and your tests are completely normal, you will receive your results only by: MyChart Message (if you have MyChart) OR A paper copy in the mail If you have any lab test that is abnormal or we need to change your treatment, we will call you to review the results.   Testing/Procedures: None Ordered   Follow-Up: At [facility], you and your health needs are our priority.  As part of our continuing mission to provide you with exceptional heart care, we have created designated Provider Care Teams.  These Care Teams include your primary Cardiologist (physician) and Advanced Practice Providers (APPs -  Physician Assistants and Nurse Practitioners) who all work together to provide you with the care you need, when you need it.  We recommend signing up for the patient portal called MyChart.  Sign up information is provided on this After Visit Summary.  MyChart is used to connect with patients for Virtual Visits (Telemedicine).  Patients are able to view lab/test results, encounter notes, upcoming appointments, etc.  Non-urgent messages can be sent to your provider as well.   To learn more about what you can do with MyChart, go to [email or web address].    Your next appointment:   12 month(s)  The format for your next appointment:   In Person  Provider:   Lamar Fitch, MD    Other Instructions BP [phone] /60-85

## 2023-11-03 ENCOUNTER — Telehealth: Payer: Self-pay

## 2023-11-03 LAB — BASIC METABOLIC PANEL
BUN/Creatinine Ratio: 17 (ref 10–24)
BUN: 22 mg/dL (ref 8–27)
CO2: 25 mmol/L (ref 20–29)
Calcium: 9.1 mg/dL (ref 8.6–10.2)
Chloride: 100 mmol/L (ref 96–106)
Creatinine, Ser: 1.29 mg/dL — ABNORMAL HIGH (ref 0.76–1.27)
Glucose: 150 mg/dL — ABNORMAL HIGH (ref 70–99)
Potassium: 5.1 mmol/L (ref 3.5–5.2)
Sodium: 140 mmol/L (ref 134–144)
eGFR: 59 mL/min/{1.73_m2} — ABNORMAL LOW (ref >59)

## 2023-11-03 LAB — MAGNESIUM: Magnesium: 1.2 mg/dL — ABNORMAL LOW (ref 1.6–2.3)

## 2023-11-03 NOTE — Telephone Encounter (Signed)
 Left vm to return call.

## 2023-11-03 NOTE — Telephone Encounter (Signed)
-----   Message from [facility] sent at 11/03/2023  7:26 AM EST ----- Magnesium remains low I would increase it to twice daily.

## 2023-11-03 NOTE — Addendum Note (Signed)
 Addended by: Eleonore Chiquito on: 11/03/2023 10:04 AM   Modules accepted: Orders

## 2023-11-03 NOTE — Telephone Encounter (Signed)
 Shoes are in [employer]

## 2023-11-14 ENCOUNTER — Ambulatory Visit: Payer: Medicare PPO | Admitting: Podiatry

## 2023-11-14 ENCOUNTER — Encounter: Payer: Self-pay | Admitting: Podiatry

## 2023-11-14 DIAGNOSIS — M79675 Pain in left toe(s): Secondary | ICD-10-CM

## 2023-11-14 DIAGNOSIS — M79674 Pain in right toe(s): Secondary | ICD-10-CM

## 2023-11-14 DIAGNOSIS — I739 Peripheral vascular disease, unspecified: Secondary | ICD-10-CM

## 2023-11-14 DIAGNOSIS — E1142 Type 2 diabetes mellitus with diabetic polyneuropathy: Secondary | ICD-10-CM

## 2023-11-14 DIAGNOSIS — B351 Tinea unguium: Secondary | ICD-10-CM

## 2023-11-14 NOTE — Progress Notes (Signed)
 Subjective:  Patient ID: Anthony Mueller, male    DOB: May 02, 1951,  MRN: [id number]  Chief Complaint  Patient presents with   Wound Check    9 wek wound check. [facility] and a shoe pick up.  A1c 7.9, ASA. [name] ( wife ) is in with him, unable to transfer.    73 y.o. male presents for follow-up of left hallux ulceration.  This appears well-healed at this point.  He presents for nail trim due to painful, thickened, elongated, dystrophic toenails at the patient is unable to maintain himself due to their thickness and length and due to mobility issues.  He is picking up diabetic shoes today.  Wheelchair-bound.  Last A1c 7.9.  Past Medical History:  Diagnosis Date   Accelerated hypertension 03/31/2022   Acute diastolic (congestive) heart failure (HCC) 03/31/2022   Acute on chronic anemia 01/13/2022   AKI (acute kidney injury) (HCC) 01/13/2022   At risk for fall due to comorbid condition 01/20/2022   B12 deficiency 03/01/2022   Bilateral lower extremity edema 01/13/2022   Calculus of gallbladder with acute cholecystitis without obstruction 02/28/2022   Cardiomegaly 11/10/2021   Cerebellar degeneration (HCC)    CHF exacerbation (HCC) 03/31/2022   Cholelithiasis 11/10/2021   Chronic diastolic heart failure (HCC) 03/31/2022   Chronic suprapubic catheter (HCC) 12/08/2022   [facility]     Diabetes mellitus without complication (HCC)    DM type 2 with diabetic mixed hyperlipidemia (HCC) 03/04/2020   Does not transfer from wheelchair to toilet 01/20/2022   Drug therapy 02/02/2016   Elevated antinuclear antibody (ANA) level 10/06/2015   Frail elderly 04/07/2022   Gait disorder 09/07/2016   GERD (gastroesophageal reflux disease)    Gout 08/28/2012   History of 2019 novel coronavirus disease (COVID-19) 10/27/2021   History of colonic polyps 11/14/2011   History of esophageal stricture 05/02/2018   Hypercholesteremia 03/31/2022   Hypercoagulable state due to persistent atrial  fibrillation (HCC) 12/16/2021   Hyperlipidemia    Hypertension    Hypertension, essential, benign 08/28/2012   Hypertensive heart disease 01/05/2022   Hypertensive heart disease with acute on chronic diastolic congestive heart failure (HCC) 03/31/2022   Hypertensive urgency 03/31/2022   Hypokalemia 11/10/2021   Hypomagnesemia 01/13/2022   Intertrigo 09/03/2018   Iron  deficiency anemia due to chronic blood loss 03/01/2022   Long term (current) use of anticoagulants 12/16/2021   Malaise and fatigue 03/31/2022   Muscular deconditioning 12/23/2021   Obesity (BMI 30-39.9) 03/15/2023   Occult GI bleeding 11/06/2017   OSA (obstructive sleep apnea) 01/13/2022   Formatting of this note might be different from the original. WEARS CPAP   Persistent atrial fibrillation (HCC) 11/13/2021   Formatting of this note might be different from the original. Last Assessment & Plan:  Formatting of this note might be different from the original. - Patient noted to be in atrial fibrillation, bradycardia with sinus pauses -Per cardiology, started on apixaban , will need to be stopped prior to his upcoming rectal surgery (stop 5 days prior to the surgery and then resume once cleared by surgery se   Positive ANA (antinuclear antibody) 10/06/2015   Postoperative anemia due to acute blood loss 01/20/2022   Postoperative examination 02/28/2022   Pulmonary edema 03/31/2022   Rectal bleeding 01/14/2022   RLS (restless legs syndrome) 09/08/2016   Shortness of breath 03/31/2022   Skin cancer    Transaminitis 11/10/2021   Trigger middle finger of left hand 10/07/2015   Type 2 diabetes mellitus with hyperlipidemia (HCC)  11/10/2021   Uninhibited neuropathic bladder, 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   Unspecified atrial fibrillation (HCC) with sinus pauses 11/13/2021    Allergies  Allergen Reactions   Victoza [Liraglutide]     CAN NOT TAKE BECAUSE A SIDE EFFECT IS PANCREATITIS AND HE HAS A HISTORY Contraindicated  due to pancreatitis    Hydrocodone     MAKES FEEL SPACEY AND LIGHT HEADED   Hydrocodone-Acetaminophen      Feels  orbity   Other     BANDAID  CAUSES RASH   Tape Other (See Comments)    Skin irritation, welts   Latex Rash    Band-aids   Metronidazole Nausea And Vomiting     GI Upset (intolerance) AND DEPRESSION ;  TABLETS Does fine w/ IV Flagyl     ROS: Negative except as per HPI above  Objective:  General: AAO x3, NAD  Dermatological: Left hallux ulceration site appears well-healed.  Pedal skin is atrophic and shiny.  Dry xerotic skin around the heels.  Nails x 5 bilaterally are notable for increased thickness, increased length, dystrophic appearance with yellow discoloration and subungual debris consistent with mycotic infection.  Nail plates are tender on direct dorsal palpation.   Vascular:  Dorsalis Pedis artery and Posterior Tibial artery pedal pulses are 1 out of 4 bilateral edema noted to bilateral foot and ankle. Pedal Hair growth absent.  Capillary refill 3 to 5 seconds to the digits.  Neruologic: Grossly diminished to light touch and protective sensation absent at the toes of the left foot  Musculoskeletal: Absent lower extremity strength in the left lower extremity.  Patient wheelchair-bound  Gait: Wheelchair bound  No images are attached to the encounter.   Assessment:   1. Pain due to onychomycosis of toenails of both feet   2. DM type 2 with diabetic peripheral neuropathy (HCC)   3. PVD (peripheral vascular disease) (HCC)       Plan:  Patient was evaluated and treated and all questions answered.  # Pain due to onychomycosis of toenails - Nails were debrided in thickness and length using sterile nail nippers without incident. -Mechanical bur used to file the nails thin.  # Type 2 diabetes with peripheral neuropathy # PVD Patient educated on diabetes. Discussed proper diabetic foot care and discussed risks and complications of disease. Educated  patient in depth on reasons to return to the office immediately should he/she discover anything concerning or new on the feet. All questions answered. Discussed proper shoes as well.  -Diabetic shoes dispensed today.  Return in about 9 weeks (around 01/16/2024) for Diabetic Foot Care.          Ethan Saddler, DPM [facility] / [facility]

## 2023-11-15 ENCOUNTER — Inpatient Hospital Stay: Payer: Medicare PPO

## 2023-12-13 ENCOUNTER — Telehealth: Payer: Self-pay | Admitting: Podiatry

## 2023-12-13 NOTE — Telephone Encounter (Signed)
error 

## 2023-12-20 ENCOUNTER — Ambulatory Visit: Payer: Medicare PPO

## 2023-12-28 DIAGNOSIS — N1831 Chronic kidney disease, stage 3a: Secondary | ICD-10-CM

## 2023-12-28 DIAGNOSIS — N183 Chronic kidney disease, stage 3 unspecified: Secondary | ICD-10-CM | POA: Insufficient documentation

## 2023-12-28 HISTORY — DX: Chronic kidney disease, stage 3a: N18.31

## 2023-12-28 HISTORY — DX: Chronic kidney disease, stage 3 unspecified: N18.30

## 2023-12-29 DIAGNOSIS — N189 Chronic kidney disease, unspecified: Secondary | ICD-10-CM | POA: Insufficient documentation

## 2023-12-29 HISTORY — DX: Chronic kidney disease, unspecified: N18.9

## 2024-01-14 ENCOUNTER — Other Ambulatory Visit: Payer: Self-pay | Admitting: Oncology

## 2024-01-14 DIAGNOSIS — D649 Anemia, unspecified: Secondary | ICD-10-CM

## 2024-01-14 NOTE — Progress Notes (Unsigned)
 [facility]  [address] [place],  [place]  [address] [phone]  Clinic Day:  01/15/2024  Referring physician: Everlean Cherry, MD  HISTORY OF PRESENT ILLNESS:  The patient is a 73 y.o. 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 He has received IV iron, which was effective in bolstering his iron stores and improving his hemoglobin.   He continues to take monthly B12 injections to fortify his cobalamin stores.  He comes in today to reassess his anemia.  Since his last visit, the patient has been doing okay.  He denies having increased fatigue or any overt forms of blood loss which concern him for progressive anemia.  His daughter is concerned about his weight gain; she believes he has gained over 20-25 pounds over these past few months.  As he has congestive heart failure, the concern is this is all fluid weight gain.  PHYSICAL EXAM:  Blood pressure 129/75, pulse 62, temperature 97.6 F (36.4 C), temperature source Oral, resp. rate 18, height 6\' 1"  (1.854 m), weight 289 lb 11.2 oz (131.4 kg), SpO2 96%. Wt Readings from Last 3 Encounters:  01/15/24 289 lb 11.2 oz (131.4 kg)  05/03/23 259 lb 14.4 oz (117.9 kg)  04/05/23 264 lb 4.8 oz (119.9 kg)   Body mass index is 38.22 kg/m. Performance status (ECOG): 2 - Symptomatic, <50% confined to bed Physical Exam Constitutional:      Appearance: Normal appearance. He is not ill-appearing.     Comments: A chronically ill-appearing gentleman who is in a wheelchair.  A Foley catheter is in place  HENT:     Mouth/Throat:     Mouth: Mucous membranes are moist.     Pharynx: Oropharynx is clear. No oropharyngeal exudate or posterior oropharyngeal erythema.  Cardiovascular:     Rate and Rhythm: Normal rate and regular rhythm.     Heart sounds: No murmur heard.    No friction rub. No gallop.  Pulmonary:     Effort: Pulmonary effort is normal. No respiratory distress.      Breath sounds: Normal breath sounds. No wheezing, rhonchi or rales.  Abdominal:     General: Bowel sounds are normal. There is no distension.     Palpations: Abdomen is soft. There is no mass.     Tenderness: There is no abdominal tenderness.  Musculoskeletal:        General: No swelling.     Right lower leg: No edema.     Left lower leg: No edema.  Lymphadenopathy:     Cervical: No cervical adenopathy.     Upper Body:     Right upper body: No supraclavicular or axillary adenopathy.     Left upper body: No supraclavicular or axillary adenopathy.     Lower Body: No right inguinal adenopathy. No left inguinal adenopathy.  Skin:    General: Skin is warm.     Coloration: Skin is not jaundiced.     Findings: No lesion or rash.  Neurological:     General: No focal deficit present.     Mental Status: He is alert and oriented to person, place, and time. Mental status is at baseline.  Psychiatric:        Mood and Affect: Mood normal.        Behavior: Behavior normal.        Thought Content: Thought content normal.    LABS:    Latest Reference Range &  Units 01/15/24 12:52  WBC 4.0 - 10.5 K/uL 14.3 (H)  RBC 4.22 - 5.81 MIL/uL 3.72 (L)  Hemoglobin 13.0 - 17.0 g/dL 12.8 (L)  HCT 78.6 - 76.7 % 35.2 (L)  MCV 80.0 - 100.0 fL 94.6  MCH 26.0 - 34.0 pg 31.5  MCHC 30.0 - 36.0 g/dL 20.9  RDW 47.0 - 96.2 % 13.5  Platelets 150 - 400 K/uL 220  nRBC 0.0 - 0.2 % 0 /100 WBC 0.00 0  Neutrophils % 75  Lymphocytes % 12  Monocytes Relative % 8  Eosinophil % 3  Basophil % 1  Immature Granulocytes % 1  (H): Data is abnormally high (L): Data is abnormally low  Latest Reference Range & Units 01/15/24 12:52  Sodium 135 - 145 mmol/L 138  Potassium 3.5 - 5.1 mmol/L 4.6  Chloride 98 - 111 mmol/L 98  CO2 22 - 32 mmol/L 26  Glucose 70 - 99 mg/dL 836 (H)  BUN 8 - 23 mg/dL 25 (H)  Creatinine 6.29 - 1.24 mg/dL 4.76 (H)  Calcium 8.9 - 10.3 mg/dL 9.6  Anion gap 5 - 15  14  Alkaline Phosphatase 38 -  126 U/L 45  Albumin 3.5 - 5.0 g/dL 4.0  AST 15 - 41 U/L 17  ALT 0 - 44 U/L 15  Total Protein 6.5 - 8.1 g/dL 6.9  Total Bilirubin 0.0 - 1.2 mg/dL 0.3  GFR, Est Non African American >60 mL/min 53 (L)  (H): Data is abnormally high (L): Data is abnormally low   Latest Reference Range & Units 01/15/24 12:52 01/15/24 12:53  Iron 45 - 182 ug/dL  62  UIBC ug/dL  546  TIBC 503 - 546 ug/dL  568  Saturation Ratios 17.9 - 39.5 %  18  Ferritin 24 - 336 ng/mL  77  Folate >5.9 ng/mL 13.2   Vitamin B12 180 - 914 pg/mL 455    ASSESSMENT & PLAN:  A 73 y.o. male with anemia secondary to both iron deficiency and vitamin B12 deficiency.  His hemoglobin of 11.7 is not much different than what it has been previously.  His iron and B12 levels remain normal.  He knows to continue taking his monthly B12 injections to ensure adequate fortification of his cobalamin stores.  His labs today also show him with mild renal insufficiency, which is also likely factoring into his anemia.  He is already seeing a nephrologist for this issue.  Otherwise, as his hemoglobin remains adequate, no intervention is necessary at this time.  I will see him back in 6 months for repeat clinical assessment.  The patient understands all the plans discussed today and is in agreement with them.  Vega Stare Kirby Funk, MD

## 2024-01-15 ENCOUNTER — Inpatient Hospital Stay: Payer: Medicare PPO

## 2024-01-15 ENCOUNTER — Other Ambulatory Visit: Payer: Self-pay | Admitting: Oncology

## 2024-01-15 ENCOUNTER — Inpatient Hospital Stay: Payer: Medicare PPO | Attending: Oncology | Admitting: Oncology

## 2024-01-15 ENCOUNTER — Telehealth: Payer: Self-pay | Admitting: Oncology

## 2024-01-15 VITALS — BP 129/75 | HR 62 | Temp 97.6°F | Resp 18 | Ht 73.0 in | Wt 289.7 lb

## 2024-01-15 DIAGNOSIS — D509 Iron deficiency anemia, unspecified: Secondary | ICD-10-CM | POA: Diagnosis present

## 2024-01-15 DIAGNOSIS — D500 Iron deficiency anemia secondary to blood loss (chronic): Secondary | ICD-10-CM

## 2024-01-15 DIAGNOSIS — E538 Deficiency of other specified B group vitamins: Secondary | ICD-10-CM | POA: Diagnosis present

## 2024-01-15 DIAGNOSIS — D649 Anemia, unspecified: Secondary | ICD-10-CM

## 2024-01-15 LAB — CBC WITH DIFFERENTIAL (CANCER CENTER ONLY)
Abs Immature Granulocytes: 0.14 10*3/uL — ABNORMAL HIGH (ref 0.00–0.07)
Basophils Absolute: 0.1 10*3/uL (ref 0.0–0.1)
Basophils Relative: 1 %
Eosinophils Absolute: 0.4 10*3/uL (ref 0.0–0.5)
Eosinophils Relative: 3 %
HCT: 35.2 % — ABNORMAL LOW (ref 39.0–52.0)
Hemoglobin: 11.7 g/dL — ABNORMAL LOW (ref 13.0–17.0)
Immature Granulocytes: 1 %
Lymphocytes Relative: 12 %
Lymphs Abs: 1.7 10*3/uL (ref 0.7–4.0)
MCH: 31.5 pg (ref 26.0–34.0)
MCHC: 33.2 g/dL (ref 30.0–36.0)
MCV: 94.6 fL (ref 80.0–100.0)
Monocytes Absolute: 1.1 10*3/uL — ABNORMAL HIGH (ref 0.1–1.0)
Monocytes Relative: 8 %
Neutro Abs: 10.9 10*3/uL — ABNORMAL HIGH (ref 1.7–7.7)
Neutrophils Relative %: 75 %
Platelet Count: 220 10*3/uL (ref 150–400)
RBC: 3.72 MIL/uL — ABNORMAL LOW (ref 4.22–5.81)
RDW: 13.5 % (ref 11.5–15.5)
WBC Count: 14.3 10*3/uL — ABNORMAL HIGH (ref 4.0–10.5)
nRBC: 0 % (ref 0.0–0.2)
nRBC: 0 /100{WBCs}

## 2024-01-15 LAB — IRON AND TIBC
Iron: 62 ug/dL (ref 45–182)
Saturation Ratios: 18 % (ref 17.9–39.5)
TIBC: 347 ug/dL (ref 250–450)
UIBC: 285 ug/dL

## 2024-01-15 LAB — CMP (CANCER CENTER ONLY)
ALT: 15 U/L (ref 0–44)
AST: 17 U/L (ref 15–41)
Albumin: 4 g/dL (ref 3.5–5.0)
Alkaline Phosphatase: 45 U/L (ref 38–126)
Anion gap: 14 (ref 5–15)
BUN: 25 mg/dL — ABNORMAL HIGH (ref 8–23)
CO2: 26 mmol/L (ref 22–32)
Calcium: 9.6 mg/dL (ref 8.9–10.3)
Chloride: 98 mmol/L (ref 98–111)
Creatinine: 1.39 mg/dL — ABNORMAL HIGH (ref 0.61–1.24)
GFR, Estimated: 53 mL/min — ABNORMAL LOW (ref >60)
Glucose, Bld: 236 mg/dL — ABNORMAL HIGH (ref 70–99)
Potassium: 4.6 mmol/L (ref 3.5–5.1)
Sodium: 138 mmol/L (ref 135–145)
Total Bilirubin: 0.3 mg/dL (ref 0.0–1.2)
Total Protein: 6.9 g/dL (ref 6.5–8.1)

## 2024-01-15 LAB — VITAMIN B12: Vitamin B-12: 455 pg/mL (ref 180–914)

## 2024-01-15 LAB — FOLATE: Folate: 13.2 ng/mL (ref >5.9)

## 2024-01-15 LAB — FERRITIN: Ferritin: 77 ng/mL (ref 24–336)

## 2024-01-15 NOTE — Telephone Encounter (Signed)
 Patient has been scheduled for follow-up visit per 01/15/24 LOS.  Pt given an appt calendar with date and time.

## 2024-01-16 ENCOUNTER — Telehealth: Payer: Self-pay

## 2024-01-16 NOTE — Telephone Encounter (Signed)
  Latest Reference Range & Units 01/15/24 12:52 01/15/24 12:53   Iron 45 - 182 ug/dL   62  UIBC ug/dL   528  TIBC 413 - 244 ug/dL   010  Saturation Ratios 17.9 - 39.5 %   18  Ferritin 24 - 336 ng/mL   77  Folate >5.9 ng/mL 13.2    Vitamin B12 180 - 914 pg/mL 455       ASSESSMENT & PLAN:  A 73 y.o. male with anemia secondary to both iron deficiency and vitamin B12 deficiency.  His hemoglobin of 11.7 is not much different than what it has been previously.  His iron and B12 levels remain normal.  He knows to continue taking his monthly B12 injections to ensure adequate fortification of his cobalamin stores.  His labs today also show him with mild renal insufficiency, which is also likely factoring into his anemia.  He is already seeing a nephrologist for this issue.  Otherwise, as his hemoglobin remains adequate, no intervention is necessary at this time.  I will see him back in 6 months for repeat clinical assessment.  The patient understands all the plans discussed today and is in agreement with them.   Dequincy Kirby Funk, MD

## 2024-01-17 ENCOUNTER — Ambulatory Visit: Payer: Medicare PPO

## 2024-01-29 ENCOUNTER — Ambulatory Visit: Payer: Medicare PPO | Admitting: Podiatry

## 2024-01-29 DIAGNOSIS — I739 Peripheral vascular disease, unspecified: Secondary | ICD-10-CM

## 2024-01-29 DIAGNOSIS — M79675 Pain in left toe(s): Secondary | ICD-10-CM | POA: Diagnosis not present

## 2024-01-29 DIAGNOSIS — M79674 Pain in right toe(s): Secondary | ICD-10-CM

## 2024-01-29 DIAGNOSIS — B351 Tinea unguium: Secondary | ICD-10-CM | POA: Diagnosis not present

## 2024-01-29 DIAGNOSIS — E1142 Type 2 diabetes mellitus with diabetic polyneuropathy: Secondary | ICD-10-CM

## 2024-01-29 NOTE — Patient Instructions (Signed)
 You can combine 3 tablespoons of coconut oil and 10-15 drops of tea tree oil together and apply to the toenails once a day.

## 2024-01-29 NOTE — Progress Notes (Unsigned)
  Subjective:  Patient ID: Anthony Mueller, male    DOB: 1951/01/24,  MRN: [id number]  Chief Complaint  Patient presents with   [facility]    [facility] with out callous today. Wife, Anthony Mueller, with him today and thinks that maybe they can wait a little longer between visits due do slow nail growth. Last A1c was 7.7 a month ago and takes ASA 31    73 y.o. male presents with the above complaint. History confirmed with patient. Patient presenting with pain related to dystrophic thickened elongated nails. Patient is unable to trim own nails related to nail dystrophy and/or mobility issues.  He is presenting in wheelchair.  Patient does  have a history of T2DM.  Last A1c approximately 7.7.   Objective:  Physical Exam: warm, good capillary refill, diminished pedal hair growth, pedal skin atrophic nail exam onychomycosis of the toenails, onycholysis, and dystrophic nails DP pulses faintly palpable, PT pulses none palpable, protective sensation absent, and vibratory sensation diminished Left Foot:  Pain with palpation of nails due to elongation and dystrophic growth.  Right Foot: Pain with palpation of nails due to elongation and dystrophic growth.   Assessment:   1. Pain due to onychomycosis of toenails of both feet   2. DM type 2 with diabetic peripheral neuropathy (HCC)   3. PVD (peripheral vascular disease) (HCC)      Plan:  Patient was evaluated and treated and all questions answered.  #Onychomycosis with pain  -Nails palliatively debrided as below. -Educated on self-care -Patient inquiring about home treatment options for onychomycosis.  Did advise that he can try mixing coconut oil and tea tree oil and applying to the toenails.  Procedure: Nail Debridement Rationale: Pain Type of Debridement: manual, sharp debridement. Instrumentation: Nail nipper, rotary burr. Number of Nails: 10  Patient educated on diabetes. Discussed proper diabetic foot care and discussed risks and complications of  disease. Educated patient in depth on reasons to return to the office immediately should he/she discover anything concerning or new on the feet. All questions answered. Discussed proper shoes as well.    Return in about 3 months (around 04/29/2024) for Diabetic Foot Care.         Bronwen Betters, DPM [facility]

## 2024-01-30 ENCOUNTER — Encounter: Payer: Self-pay | Admitting: Podiatry

## 2024-03-04 NOTE — Progress Notes (Signed)
 Cardiology Office Note:  .   Date:  03/05/2024  ID:  Anthony Mueller, DOB [birth year], MRN [id number] PCP: Karena Ota, MD  [facility] Providers Cardiologist:  Zoe Hinds, MD    History of Present Illness: .   Anthony Mueller is a 73 y.o. 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  05/09/2023 renal ultrasound no evidence of renal artery stenosis 02/01/2022 echo EF 55 to 60%, mild concentric LVH, rhythm was atrial fibrillation, biatrial dilatation, mild aortic stenosis, mild MR 01/22/2022 atrial fibrillation 100% burden, heart rate was well-controlled  [facility] with [facility] in March 2023 for evaluation of atrial fibrillation and heart failure at the past of his PCP.  He apparently had a lengthy hospitalization secondary to COVID and was noted to be in atrial fibrillation at that time.  Most recently he was evaluated by Dr. Sandee Crook on 11/02/2023, his rate was controlled, he was optimized from a heart failure perspective, no recurrence of NSVT since his magnesium  has been corrected, no changes were made to his plan of care as advised follow-up in 1 year.  Hospitalized 02/04/2024 at [facility] presenting with abdominal pain, nausea.  Troponins 2490 >> 2338 non-STEMI type I versus type II he was without chest pain, echo revealed EF 45 to 50% mild pulmonary hypertension, CT of his chest revealed diffuse three-vessel CAD.  He presents today accompanied by his wife for follow-up return for hospitalization as outlined above.  He is wheelchair dependent, has been for 18 years, therefor sedentary.  We discussed his hospitalization, he is not currently having chest pain or any anginal equivalents however he is also sedentary so we do need to investigate further.  He has some dizziness at times, hard for him to decipher if this is new or different  for him.  He denies chest pain, palpitations, dyspnea, pnd, orthopnea, n, v, syncope, edema, weight gain, or early satiety.   ROS: Review of Systems  Cardiovascular:  Positive for leg swelling (baseline).  Genitourinary:        Indwelling chronic foley  Musculoskeletal:        Wheelchair bound  Neurological:  Positive for dizziness.  All other systems reviewed and are negative.    Studies Reviewed: Aaron Aas   EKG Interpretation Date/Time:  Tuesday Mar 05 2024 13:33:14 EDT Ventricular Rate:  46 PR Interval:    QRS Duration:  90 QT Interval:  474 QTC Calculation: 414 R Axis:   42  Text Interpretation: Atrial fibrillation with slow ventricular response When compared with ECG of 27-Jan-2022 04:49, PREVIOUS ECG IS PRESENT Confirmed by Pattricia Bores [phone]) on 03/05/2024 1:37:49 PM     Risk Assessment/Calculations:    CHA2DS2-VASc Score = 5   This indicates a 7.2% annual risk of stroke. The patient's score is based upon: CHF History: 1 HTN History: 1 Diabetes History: 1 Stroke History: 0 Vascular Disease History: 1 Age Score: 1 Gender Score: 0            Physical Exam:   VS:  BP 100/70   Pulse (!) 46   Ht 6\' 1"  (1.854 m)   Wt 285 lb (129.3 kg) Comment: Verbal  SpO2 95%   BMI 37.60 kg/m    Wt Readings from Last 3 Encounters:  03/05/24 285 lb (129.3 kg)  01/15/24 289 lb 11.2 oz (131.4 kg)  05/03/23 259 lb 14.4 oz (117.9 kg)  GEN: Wheelchair-bound, chronically ill-appearing but in no acute distress NECK: No JVD; No carotid bruits CARDIAC: Irregularly irregular, no murmurs, rubs, gallops RESPIRATORY:  Clear to auscultation without rales, wheezing or rhonchi  ABDOMEN: Soft, non-tender, non-distended EXTREMITIES: Legs are edematous; No deformity   ASSESSMENT AND PLAN: .   Atrial fibrillation/hypercoagulable state-his rate is controlled, CHA2DS2-VASc score of 7, he is not on anticoagulation secondary to major hemorrhagic complications in the past, he is on low-dose  aspirin .  He is bothered by some dizziness, his heart rate being so low may be contributing to this so we will decrease his Coreg  to 12.5 mg twice daily.  CAD-most recent hospitalization he had a CT of his chest revealing three-vessel CAD, he also had an increase his troponins.  Will arrange for coronary CTA.  HFpEF-NYHA class II, he appears to be slightly volume overloaded however this the first time I am seeing him so I am not sure if this is baseline for him or not, he is unable to weigh secondary to being in a wheelchair.  Continue Coreg  but we will decrease to 12.5 mg twice daily, continue Lasix  40 mg Monday Wednesday Friday, continue hydralazine  50 mg 3 times daily, continue isosorbide 10 mg 3 times daily, continue losartan 100 mg daily.  Continue spironolactone 25 mg Monday Wednesday Friday.  Not be a candidate for SGLT2 inhibitor secondary to indwelling Foley.  Dyslipidemia-currently on lovastatin 40 mg.  Will check direct LDL and LFTs.  Hypomagnesemia-will check magnesium  level.       Dispo: Labs per above, cCTA, decrease Coreg  to 12.5 twice daily. Follow up with Dr. Sandee Crook in 6 weeks.   Signed, Terrance Ferretti, NP

## 2024-03-05 ENCOUNTER — Ambulatory Visit: Attending: Cardiology | Admitting: Cardiology

## 2024-03-05 VITALS — BP 100/70 | HR 46 | Ht 73.0 in | Wt 285.0 lb

## 2024-03-05 DIAGNOSIS — I2510 Atherosclerotic heart disease of native coronary artery without angina pectoris: Secondary | ICD-10-CM

## 2024-03-05 DIAGNOSIS — E782 Mixed hyperlipidemia: Secondary | ICD-10-CM

## 2024-03-05 DIAGNOSIS — I4821 Permanent atrial fibrillation: Secondary | ICD-10-CM | POA: Diagnosis not present

## 2024-03-05 DIAGNOSIS — D6859 Other primary thrombophilia: Secondary | ICD-10-CM

## 2024-03-05 DIAGNOSIS — I472 Ventricular tachycardia, unspecified: Secondary | ICD-10-CM

## 2024-03-05 DIAGNOSIS — I5032 Chronic diastolic (congestive) heart failure: Secondary | ICD-10-CM | POA: Diagnosis not present

## 2024-03-05 MED ORDER — CARVEDILOL 12.5 MG PO TABS: 12.5000 mg | ORAL_TABLET | Freq: Two times a day (BID) | ORAL | 3 refills | Status: DC

## 2024-03-05 NOTE — Patient Instructions (Signed)
 Medication Instructions:  Your physician has recommended you make the following change in your medication:   Coreg  12.5 mg two times daily  *If you need a refill on your cardiac medications before your next appointment, please call your pharmacy*  Lab Work: Your physician recommends that you return for lab work in:   Labs today: CMP, Magnesium , CBC, Direct LDL  If you have labs (blood work) drawn today and your tests are completely normal, you will receive your results only by: MyChart Message (if you have MyChart) OR A paper copy in the mail If you have any lab test that is abnormal or we need to change your treatment, we will call you to review the results.  Testing/Procedures:   Your cardiac CT will be scheduled at one of the below locations:   [facility] [address] [place], [place] [address] [phone]  OR  [facility] [address] [place], [place] [address] [phone]  OR   [facility] [address] [place], [place] [address] [phone]  OR   [facility] [address] [place], [place] [address] [phone]  OR   [facility] and Vascular Tower [address]  [place], [place] [address] Opening February 26, 2024  If scheduled at [facility], please arrive at the [facility] and Children's Entrance (Entrance C2) of [facility] 30 minutes prior to test start time. You can use the FREE valet parking offered at entrance C (encouraged to control the heart rate for the test)  Proceed to the [facility] Radiology Department (first floor) to check-in and test prep.   All radiology patients and guests should use entrance C2 at [facility], accessed from [facility], even though the hospital's physical address listed is [address].    If scheduled at the Heart and Vascular Tower at [address],  please enter the parking lot using the [address] entrance and use the FREE valet service at the patient drop-off area. Enter the buidling and check-in with registration on the main floor.  If scheduled at [facility] or [facility], please arrive 15 mins early for check-in and test prep.  There is spacious parking and easy access to the radiology department from the [facility] Heart and Vascular entrance. Please enter here and check-in with the desk attendant.   If scheduled at [facility], please arrive 30 minutes early for check-in and test prep.  Please follow these instructions carefully (unless otherwise directed):  An IV will be required for this test and Nitroglycerin will be given.  Hold all erectile dysfunction medications at least 3 days (72 hrs) prior to test. (Ie viagra, cialis, sildenafil, tadalafil, etc)   On the Night Before the Test: Be sure to Drink plenty of water. Do not consume any caffeinated/decaffeinated beverages or chocolate 12 hours prior to your test. Do not take any antihistamines 12 hours prior to your test.  On the Day of the Test: Drink plenty of water until 1 hour prior to the test. Do not eat any food 1 hour prior to test. You may take your regular medications prior to the test.  If you take Furosemide /Spironolactone, please HOLD on the morning of the test. Patients who wear a continuous glucose monitor MUST remove the device prior to scanning.       After the Test: Drink plenty of water. After  receiving IV contrast, you may experience a mild flushed feeling. This is normal. On occasion, you may experience a mild rash up to 24 hours after the test. This is not dangerous. If this occurs, you can take Benadryl 25 mg, Zyrtec, Claritin, or Allegra and increase your fluid intake. (Patients taking Tikosyn should avoid Benadryl, and may take Zyrtec, Claritin, or Allegra) If you experience trouble breathing,  this can be serious. If it is severe call 911 IMMEDIATELY. If it is mild, please call our office.  We will call to schedule your test 2-4 weeks out understanding that some insurance companies will need an authorization prior to the service being performed.   For more information and frequently asked questions, please visit our website : [email or web address]  For non-scheduling related questions, please contact the cardiac imaging nurse navigator should you have any questions/concerns: Cardiac Imaging Nurse Navigators Direct Office Dial: [phone]   For scheduling needs, including cancellations and rescheduling, please call [place], [phone].   Follow-Up: At [facility], you and your health needs are our priority.  As part of our continuing mission to provide you with exceptional heart care, our providers are all part of one team.  This team includes your primary Cardiologist (physician) and Advanced Practice Providers or APPs (Physician Assistants and Nurse Practitioners) who all work together to provide you with the care you need, when you need it.  Your next appointment:   6 week(s)  Provider:   Zoe Hinds, MD  We recommend signing up for the patient portal called "MyChart".  Sign up information is provided on this After Visit Summary.  MyChart is used to connect with patients for Virtual Visits (Telemedicine).  Patients are able to view lab/test results, encounter notes, upcoming appointments, etc.  Non-urgent messages can be sent to your provider as well.   To learn more about what you can do with MyChart, go to [email or web address].   Other Instructions Please keep a BP log for 2 weeks and send by MyChart or mail.                         Name and DOB__________________________ Pattricia Bores, NP [address] [place], [place] [address]  Blood Pressure Record Sheet To take your blood pressure, you will need a blood pressure machine. You can buy a  blood pressure machine (blood pressure monitor) at your clinic, drug store, or online. When choosing one, consider: An automatic monitor that has an arm cuff. A cuff that wraps snugly around your upper arm. You should be able to fit only one finger between your arm and the cuff. A device that stores blood pressure reading results. Do not choose a monitor that measures your blood pressure from your wrist or finger. Follow your health care provider's instructions for how to take your blood pressure. To use this form: Get one reading in the morning (a.m.) 1-2 hours after you take any medicines. Get one reading in the evening (p.m.) before supper.   Blood pressure log Date: _______________________  a.m. _____________________(1st reading) HR___________            p.m. _____________________(2nd reading) HR__________  Date: _______________________  a.m. _____________________(1st reading) HR___________            p.m. _____________________(2nd reading) HR__________  Date: _______________________  a.m. _____________________(1st reading) HR___________            p.m. _____________________(2nd reading) HR__________  Date: _______________________  a.m. _____________________(1st reading) HR___________  p.m. _____________________(2nd reading) HR__________  Date: _______________________  a.m. _____________________(1st reading) HR___________            p.m. _____________________(2nd reading) HR__________  Date: _______________________  a.m. _____________________(1st reading) HR___________            p.m. _____________________(2nd reading) HR__________  Date: _______________________  a.m. _____________________(1st reading) HR___________            p.m. _____________________(2nd reading) HR__________   This information is not intended to replace advice given to you by your health care provider. Make sure you discuss any questions you have with your health care  provider. Document Revised: 02/05/2020 Document Reviewed: 02/05/2020 Elsevier Patient Education  2021 [employer].

## 2024-03-06 LAB — COMPREHENSIVE METABOLIC PANEL WITH GFR
ALT: 10 IU/L (ref 0–44)
AST: 10 IU/L (ref 0–40)
Albumin: 4.1 g/dL (ref 3.8–4.8)
Alkaline Phosphatase: 37 IU/L — ABNORMAL LOW (ref 44–121)
BUN/Creatinine Ratio: 17 (ref 10–24)
BUN: 26 mg/dL (ref 8–27)
Bilirubin Total: 0.3 mg/dL (ref 0.0–1.2)
CO2: 24 mmol/L (ref 20–29)
Calcium: 9.2 mg/dL (ref 8.6–10.2)
Chloride: 96 mmol/L (ref 96–106)
Creatinine, Ser: 1.54 mg/dL — ABNORMAL HIGH (ref 0.76–1.27)
Globulin, Total: 2.6 g/dL (ref 1.5–4.5)
Glucose: 151 mg/dL — ABNORMAL HIGH (ref 70–99)
Potassium: 4.8 mmol/L (ref 3.5–5.2)
Sodium: 137 mmol/L (ref 134–144)
Total Protein: 6.7 g/dL (ref 6.0–8.5)
eGFR: 47 mL/min/1.73 — ABNORMAL LOW

## 2024-03-06 LAB — CBC
Hematocrit: 33.9 % — ABNORMAL LOW (ref 37.5–51.0)
Hemoglobin: 11.4 g/dL — ABNORMAL LOW (ref 13.0–17.7)
MCH: 31.6 pg (ref 26.6–33.0)
MCHC: 33.6 g/dL (ref 31.5–35.7)
MCV: 94 fL (ref 79–97)
Platelets: 215 x10E3/uL (ref 150–450)
RBC: 3.61 x10E6/uL — ABNORMAL LOW (ref 4.14–5.80)
RDW: 12.9 % (ref 11.6–15.4)
WBC: 11.1 x10E3/uL — ABNORMAL HIGH (ref 3.4–10.8)

## 2024-03-06 LAB — LDL CHOLESTEROL, DIRECT: LDL Direct: 40 mg/dL (ref 0–99)

## 2024-03-06 LAB — MAGNESIUM: Magnesium: 1.3 mg/dL — ABNORMAL LOW (ref 1.6–2.3)

## 2024-03-08 ENCOUNTER — Telehealth: Payer: Self-pay | Admitting: Emergency Medicine

## 2024-03-08 NOTE — Telephone Encounter (Signed)
-----   Message from Terrance Ferretti sent at 03/08/2024  9:07 AM EDT ----- Increase supplement to three times/day.  Focus on magnesium  rich foods (fish, nuts) ----- Message ----- From: Emery Hans, RN Sent: 03/06/2024   1:45 PM EDT To: Terrance Ferretti, NP   ----- Message ----- From: Terrance Ferretti, NP Sent: 03/06/2024  11:24 AM EDT To: Emery Hans, RN  His magnesium  is low.  Start magnesium  oxide 400 mg 2 tablets daily.  There is some kidney dysfunction, please make sure he is drinking enough water, approximately 48 ounces per day.  He follows with nephrology as well.  Have him return for repeat magnesium  level in 2 weeks.

## 2024-03-08 NOTE — Telephone Encounter (Addendum)
 Called and spoke to patient.   Reviewed lab results and recommendations with patient as per [facility] note. Reviewed to increase magnesium  supplement to three times a day and increase Magnesium   rich foods. Reviewed to have blood work repeated in two weeks. Patient verbalized understanding and had no further questions.

## 2024-03-22 ENCOUNTER — Ambulatory Visit (HOSPITAL_BASED_OUTPATIENT_CLINIC_OR_DEPARTMENT_OTHER): Payer: Self-pay | Admitting: Family

## 2024-03-22 LAB — MAGNESIUM: Magnesium: 1.5 mg/dL — ABNORMAL LOW (ref 1.6–2.3)

## 2024-03-22 MED ORDER — MAGNESIUM OXIDE -MG SUPPLEMENT 400 MG PO CAPS: 800.0000 mg | ORAL_CAPSULE | Freq: Every day | ORAL | 3 refills | Status: DC

## 2024-03-22 NOTE — Addendum Note (Signed)
 Addended by: Einar Grave on: 03/22/2024 11:23 AM   Modules accepted: Orders

## 2024-03-22 NOTE — Telephone Encounter (Signed)
 As Mr. Reasoner was taking Magnesium  665mg  QAM and levels were still low on labs, recommend:  Magnesium  800mg  (two 400mg  tablets) twice per day for 3 days After 3 days, reduce to Magnesium  800mg  (two 400mg  tablets) daily Repeat BMET/magnesium  level in 7-10 days.   Jaeven Wanzer S [place] Nichols, NP

## 2024-04-01 ENCOUNTER — Other Ambulatory Visit: Payer: Self-pay

## 2024-04-01 ENCOUNTER — Telehealth: Payer: Self-pay

## 2024-04-01 NOTE — Telephone Encounter (Signed)
 Received the following message from Neomi Banks, NP below:  "Please ensure repeat magnesium  level ordered and performed this week."  Patient's wife stated that she was not able to bring the patient into the office because he was a total care patient and he was failure to thrive and she could not care for him at home anymore. She was in the process of putting him in a nursing home so they could help take care of him. She also was working in the yard and has now gotten poison ivy that has turned into an infection. She recommended having [facility] come out and draw the patient's labs. Please advise if this is possible for them to come out to the patient's house and draw his labs?

## 2024-04-01 NOTE — Progress Notes (Signed)
 Received the following message from Neomi Banks, NP below:  "Please ensure repeat magnesium  level ordered and performed this week."  Patient's wife stated that she was not able to bring the patient into the office because he was a total care patient and he was failure to thrive and she could not care for him at home anymore. She was in the process of putting him in a nursing home so they could help take care of him. She also was working in the yard and has now gotten poison ivy that has turned into an infection. She recommended having [facility] come out and draw the patient's labs. Please advise if this is possible for them to come out to the patient's house and draw his labs?

## 2024-04-03 NOTE — Telephone Encounter (Signed)
 Called the patients wife and informed her that we were reaching out to the patient's PCP for help with setting up [facility] to come out and draw the patient's magnesium  level.The patient's wife verbalized understanding and had no further questions at this time.

## 2024-04-03 NOTE — Telephone Encounter (Signed)
 Spoke with Pattricia Bores regarding options for having the patient's Magnesium   level drawn and she recommended reaching out to the patient's PCP for assistance with setting up [facility] to have the lab work drawn. A message was sent to the patient's PCP for assistance with setting up [facility] to have the magnesium  level drawn.

## 2024-04-05 ENCOUNTER — Encounter (HOSPITAL_COMMUNITY): Payer: Self-pay

## 2024-04-08 ENCOUNTER — Telehealth (HOSPITAL_COMMUNITY): Payer: Self-pay | Admitting: Emergency Medicine

## 2024-04-08 NOTE — Telephone Encounter (Signed)
 Reaching out to patient to offer assistance regarding upcoming cardiac imaging study; pt verbalizes understanding of appt date/time, parking situation and where to check in, pre-test NPO status and medications ordered, and verified current allergies; name and call back number provided for further questions should they arise Jinger Mount RN Navigator Cardiac Imaging [facility] [phone] office [phone] cell  Pt is nonambulatory and weighs 285. Uses hoyers and Lue [employer]kora steadys to mobilize.

## 2024-04-09 ENCOUNTER — Ambulatory Visit (HOSPITAL_BASED_OUTPATIENT_CLINIC_OR_DEPARTMENT_OTHER)
Admission: RE | Admit: 2024-04-09 | Discharge: 2024-04-09 | Disposition: A | Discharge status: Home / Self Care | Source: Ambulatory Visit | Attending: Cardiology | Admitting: Cardiology

## 2024-04-09 DIAGNOSIS — I5032 Chronic diastolic (congestive) heart failure: Secondary | ICD-10-CM | POA: Diagnosis present

## 2024-04-09 DIAGNOSIS — I4821 Permanent atrial fibrillation: Secondary | ICD-10-CM | POA: Insufficient documentation

## 2024-04-09 DIAGNOSIS — D6859 Other primary thrombophilia: Secondary | ICD-10-CM | POA: Insufficient documentation

## 2024-04-09 DIAGNOSIS — E782 Mixed hyperlipidemia: Secondary | ICD-10-CM | POA: Diagnosis present

## 2024-04-09 DIAGNOSIS — I472 Ventricular tachycardia, unspecified: Secondary | ICD-10-CM | POA: Insufficient documentation

## 2024-04-09 MED ORDER — METOPROLOL TARTRATE 5 MG/5ML IV SOLN: 10.0000 mg | INTRAVENOUS | Status: DC | PRN

## 2024-04-09 MED ORDER — DILTIAZEM HCL 25 MG/5ML IV SOLN: 10.0000 mg | INTRAVENOUS | Status: DC | PRN

## 2024-04-09 MED ORDER — IOHEXOL 350 MG/ML SOLN
100.0000 mL | Freq: Once | INTRAVENOUS | Status: AC | PRN
  Administered 2024-04-09: 100 mL via INTRAVENOUS

## 2024-04-09 MED ORDER — NITROGLYCERIN 0.4 MG SL SUBL
0.8000 mg | SUBLINGUAL_TABLET | Freq: Once | SUBLINGUAL | Status: AC
  Administered 2024-04-09: 0.8 mg via SUBLINGUAL

## 2024-04-11 ENCOUNTER — Ambulatory Visit (HOSPITAL_BASED_OUTPATIENT_CLINIC_OR_DEPARTMENT_OTHER)
Admission: RE | Admit: 2024-04-11 | Discharge: 2024-04-11 | Disposition: A | Discharge status: Home / Self Care | Source: Ambulatory Visit | Attending: Cardiology | Admitting: Cardiology

## 2024-04-11 ENCOUNTER — Other Ambulatory Visit: Payer: Self-pay | Admitting: Cardiology

## 2024-04-11 DIAGNOSIS — R931 Abnormal findings on diagnostic imaging of heart and coronary circulation: Secondary | ICD-10-CM | POA: Diagnosis present

## 2024-04-11 NOTE — Progress Notes (Signed)
FFR Orders 

## 2024-04-12 ENCOUNTER — Ambulatory Visit: Payer: Self-pay | Admitting: Cardiology

## 2024-04-15 ENCOUNTER — Ambulatory Visit: Payer: Self-pay | Admitting: Cardiology

## 2024-04-16 ENCOUNTER — Ambulatory Visit: Admitting: Cardiology

## 2024-04-16 MED ORDER — NITROGLYCERIN 0.4 MG SL SUBL: 0.4000 mg | SUBLINGUAL_TABLET | SUBLINGUAL | 6 refills | Status: AC | PRN

## 2024-04-16 NOTE — Telephone Encounter (Signed)
-----   Message from Terrance Ferretti sent at 04/15/2024  9:38 AM EDT ----- Coronary CTA is abnormal, he has disease in the arteries around his heart and needs to have this evaluated further.  Please set him up with one of the physicians in the office this week so he can be seen quickly for further discussion.  He is on ASA so this is good. Please send in NTG and review how to use it and when to present to ED. ----- Message ----- From: Interface, Rad Results In Sent: 04/11/2024   9:06 PM EDT To: Terrance Ferretti, NP

## 2024-04-16 NOTE — Telephone Encounter (Signed)
 Results reviewed with pt as per Wallis Bamberg NP's note.  Pt verbalized understanding and had no additional questions. Routed to PCP.

## 2024-04-17 ENCOUNTER — Other Ambulatory Visit: Payer: Self-pay

## 2024-04-18 ENCOUNTER — Ambulatory Visit

## 2024-04-18 VITALS — BP 122/60 | HR 56 | Ht 73.0 in | Wt 285.0 lb

## 2024-04-18 DIAGNOSIS — I2510 Atherosclerotic heart disease of native coronary artery without angina pectoris: Secondary | ICD-10-CM

## 2024-04-18 DIAGNOSIS — I5042 Chronic combined systolic (congestive) and diastolic (congestive) heart failure: Secondary | ICD-10-CM | POA: Diagnosis not present

## 2024-04-18 HISTORY — DX: Atherosclerotic heart disease of native coronary artery without angina pectoris: I25.10

## 2024-04-18 NOTE — Assessment & Plan Note (Signed)
 Cardiac CT completed 04/09/2024 noted severe coronary atherosclerosis with calcium  score 3141, plaque volume 2442 mm cube, CAD RADS 4 study with severe stenosis of mid LAD, moderate stenosis of RCA and LCx and CT FFR noted high likelihood of hemodynamically significant stenosis of mid LAD and total occlusion of distal LCx.    In context of known cardiomyopathy, hemodynamically significant lesions, discussed extensively about further management with cardiac catheterization.   Shared Decision Making/Informed Consent{ The risks [stroke (1 in 1000), death (1 in 1000), kidney failure [usually temporary] (1 in 500), bleeding (1 in 200), allergic reaction [possibly serious] (1 in 200)], benefits (diagnostic support and management of coronary artery disease) and alternatives of a cardiac catheterization were discussed in detail with him and his family members today all and they are  willing to proceed.  The request to schedule this tentatively a week of July 6. This I feel is reasonable and should be okay to wait.  If you have any chest pain or acute worsening should head to the ER or call 911 right away.

## 2024-04-18 NOTE — Progress Notes (Signed)
 Cardiology Consultation:    Date:  04/18/2024   ID:  Anthony Mueller, DOB [birth year], MRN [id number]  PCP:  Anthony Ota, MD  Cardiologist:  Anthony Evans Arthur Aydelotte, MD   Referring MD: Anthony Ota, MD   No chief complaint on file.    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  Now noted with severe triple-vessel disease on cardiac CT June 2025 with hemodynamically significant lesion in mid LAD and CTO of distal LCx.  Also noted large paraesophageal hiatal hernia.   Discussed results from cardiac CT and in the context of severe hemodynamically significant lesions and cardiomyopathy, to proceed with cardiac catheterization.  Discussed extensively about the cardiac cath contrast use and risk on kidney function.   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  Hypervolemic. Compensated. Functional status extremely limited due to his cerebellar degeneration and wheelchair limited baseline.  Continue with salt restriction to milligram 1/day. Fluid restriction to below 2 L/day. Continue with furosemide  40 mg 4 times a week.  Guideline-directed medical therapy continue with carvedilol  12.5 mg twice daily Continue losartan 100 mg once daily Continue spironolactone 25 mg 3 days a week.  He is also on hydralazine  50 mg 3 times a day and isosorbide dinitrate 10 mg 3 times a day.  Not a candidate for  SGLT2 inhibitors due to his mobility and risk for urinary tract infections.      CAD (coronary artery disease) - Primary   Cardiac CT completed 04/09/2024 noted severe coronary atherosclerosis with calcium  score 3141, plaque volume 2442 mm cube, CAD RADS 4 study with severe stenosis of mid LAD, moderate stenosis of RCA and LCx and CT FFR noted high likelihood of hemodynamically significant stenosis of mid LAD and total occlusion of distal LCx.    In context of known cardiomyopathy, hemodynamically significant lesions, discussed extensively about further management with cardiac catheterization.   Shared Decision Making/Informed Consent{ The risks [stroke (1 in 1000), death (1 in 1000), kidney failure [usually temporary] (1 in 500), bleeding (1 in 200), allergic reaction [possibly serious] (1 in 200)], benefits (diagnostic support and management of coronary artery disease) and alternatives of a cardiac catheterization were discussed in detail with him and his family members today all and they are  willing to proceed.  The request to schedule this tentatively a week of July 6. This I feel is reasonable and should be okay to wait.  If you have any chest pain or acute worsening should head to the ER or call 911 right away.         Return to clinic tentatively in 2 to 3 weeks after his cardiac catheterization.   History of Present Illness:    Anthony Mueller is a 73 y.o. male who is being seen today for follow-up visit. PCP is Anthony Ota, MD. Last visit with our office was 5 04/21/2024 with Anthony Bores, NP-C.  Here for the visit today accompanied by his wife, youngest son, and  his oldest son Anthony Mueller was on further discussion over the speaker phone.  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  Now noted with severe triple-vessel disease on cardiac CT June 2025 with hemodynamically significant lesion in mid LAD and CTO of distal LCx.  Also noted large paraesophageal hiatal hernia.  Earlier in April he was admitted at [facility] for symptoms of reduced p.o. intake abdominal symptoms and poor oral intake and Hospital course was notable for markedly elevated high-sensitivity troponins and mildly reduced LVEF 45 to 50% with regional wall motion abnormalities involving anterior and septal wall segments.  There was note made about severe coronary calcifications.  Unclear what the decision-making was with regards to consideration for further ischemic workup but given any lack of cardiac symptoms of chest pain or shortness of breath at the time he  was arranged for follow-up with cardiology office as outpatient.  This occurred on 03/05/2024 with Anthony Mueller at which time further evaluation for her CAD was done with cardiac CT.  Cardiac CT completed 04/09/2024 noted severe coronary atherosclerosis with calcium  score 3141, plaque volume 2442 mm cube, CAD RADS 4 study with severe stenosis of mid LAD, moderate stenosis of RCA and LCx and CT FFR noted high likelihood of hemodynamically significant stenosis of mid LAD and total occlusion of distal LCx.  In addition there is a large paraesophageal hiatal hernia and subsegmental atelectasis.  EKG in the clinic today shows atrial fibrillation ventricular rate controlled 52/min, QRS duration 92 ms, no significant ST-T changes to suggest ischemia.  T wave inversions in lead III and aVF new in comparison to prior EKG from Mar 05, 2024  Blood work from 03/05/2024 noted BUN 26, creatinine 1.54, EGFR 47, suggest CKD stage III Magnesium  level 1.3 Lipid  panel with direct LDL 40, CBC with hemoglobin 11.4 and hematocrit 33.9 and WBC 11.1 and platelets 215.  Past Medical History:  Diagnosis Date   Accelerated hypertension 03/31/2022   Acute diastolic (congestive) heart failure (HCC) 03/31/2022   Acute on chronic anemia 01/13/2022   AKI (acute kidney injury) (HCC) 01/13/2022   At risk for fall due to comorbid condition 01/20/2022   B12 deficiency 03/01/2022   Bilateral lower extremity edema 01/13/2022   Calculus of gallbladder with acute cholecystitis without obstruction 02/28/2022   Cardiomegaly 11/10/2021   Cerebellar degeneration (HCC)    CHF exacerbation (HCC) 03/31/2022   Cholelithiasis 11/10/2021   Chronic diastolic heart failure (HCC) 03/31/2022   Chronic kidney disease, stage 3a [facility]) 12/28/2023   11/22/2023 Nephrology OV note.     Chronic suprapubic catheter (HCC) 12/08/2022   [facility]     Diabetes mellitus without complication (HCC)    DM type 2 with diabetic mixed hyperlipidemia (HCC) 03/04/2020   Does not transfer from wheelchair to toilet 01/20/2022   Drug therapy 02/02/2016   Elevated antinuclear antibody (ANA) level 10/06/2015   Frail elderly 04/07/2022   Gait disorder 09/07/2016   GERD (gastroesophageal reflux disease)    Gout 08/28/2012   History of 2019 novel coronavirus disease (COVID-19) 10/27/2021   History of colonic polyps 11/14/2011   History of esophageal stricture 05/02/2018   Hypercholesteremia 03/31/2022   Hypercoagulable state  due to persistent atrial fibrillation (HCC) 12/16/2021   Hyperlipidemia    Hypertension    Hypertension, essential, benign 08/28/2012   Hypertensive heart disease 01/05/2022   Hypertensive heart disease with acute on chronic diastolic congestive heart failure (HCC) 03/31/2022   Hypertensive urgency 03/31/2022   Hypokalemia 11/10/2021   Hypomagnesemia 01/13/2022   Intertrigo 09/03/2018   Iron  deficiency anemia due to chronic blood loss 03/01/2022   Long term (current)  use of anticoagulants 12/16/2021   Malaise and fatigue 03/31/2022   Muscular deconditioning 12/23/2021   Obesity (BMI 30-39.9) 03/15/2023   Occult GI bleeding 11/06/2017   OSA (obstructive sleep apnea) 01/13/2022   Formatting of this note might be different from the original. WEARS CPAP   Persistent atrial fibrillation (HCC) 11/13/2021   Formatting of this note might be different from the original. Last Assessment & Plan:  Formatting of this note might be different from the original. - Patient noted to be in atrial fibrillation, bradycardia with sinus pauses -Per cardiology, started on apixaban , will need to be stopped prior to his upcoming rectal surgery (stop 5 days prior to the surgery and then resume once cleared by surgery se   Positive ANA (antinuclear antibody) 10/06/2015   Postoperative anemia due to acute blood loss 01/20/2022   Postoperative examination 02/28/2022   Pulmonary edema 03/31/2022   Rectal bleeding 01/14/2022   RLS (restless legs syndrome) 09/08/2016   Shortness of breath 03/31/2022   Skin cancer    Transaminitis 11/10/2021   Trigger middle finger of left hand 10/07/2015   Type 2 diabetes mellitus with hyperlipidemia (HCC) 11/10/2021   Uninhibited neuropathic bladder, 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   Unspecified atrial fibrillation (HCC) with sinus pauses 11/13/2021   Vitamin D deficiency due to chronic kidney disease 12/29/2023    Past Surgical History:  Procedure Laterality Date   APPENDECTOMY     CATARACT EXTRACTION, BILATERAL     CHOLECYSTECTOMY     HEMORRHOID SURGERY     IR CATHETER TUBE CHANGE  06/06/2022   POLYPECTOMY     ULNAR NERVE REPAIR Left     Current Medications: Current Meds  Medication Sig   acetaminophen  (TYLENOL ) 500 MG tablet Take 500-1,000 mg by mouth every 8 (eight) hours as needed for moderate pain or headache.   albuterol  (VENTOLIN  HFA) 108 (90 Base) MCG/ACT inhaler Inhale 2 puffs into the lungs every 6 (six) hours as needed for  wheezing or shortness of breath.   allopurinol  (ZYLOPRIM ) 100 MG tablet Take 100 mg by mouth in the morning.   Ascorbic Acid  (VITAMIN C) 1000 MG tablet Take 1,000 mg by mouth daily.   aspirin  EC 81 MG tablet Take 81 mg by mouth daily.   carvedilol  (COREG ) 12.5 MG tablet Take 1 tablet (12.5 mg total) by mouth 2 (two) times daily.   cloNIDine  (CATAPRES ) 0.3 MG tablet Take 0.3 mg by mouth 3 (three) times daily.   cyanocobalamin  (VITAMIN B12) 1000 MCG/ML injection Inject 1,000 mcg into the muscle every 30 (thirty) days.   finasteride (PROSCAR) 5 MG tablet Take 5 mg by mouth daily.   furosemide  (LASIX ) 40 MG tablet Take 40 mg by mouth every Monday, Wednesday, and Friday. Take 40mg  by mouth every Monday, Wednesday, Friday and Saturday   hydrALAZINE  (APRESOLINE ) 25 MG tablet Take 50 mg by mouth in the morning, at noon, and at bedtime.   isosorbide dinitrate (ISORDIL) 10 MG tablet Take 10 mg by mouth 3 (three) times daily.   losartan (COZAAR) 100 MG tablet Take  100 mg by mouth daily.   lovastatin (MEVACOR) 40 MG tablet Take 40 mg by mouth every evening.   Magnesium  Oxide -Mg Supplement 400 MG CAPS Take 800 mg by mouth daily.   metFORMIN (GLUCOPHAGE) 500 MG tablet Take 1,000 mg by mouth 2 (two) times daily.   mupirocin  ointment (BACTROBAN ) 2 % Apply 1 Application topically daily.   nitroGLYCERIN  (NITROSTAT ) 0.4 MG SL tablet Place 1 tablet (0.4 mg total) under the tongue every 5 (five) minutes as needed.   nystatin (MYCOSTATIN/NYSTOP) powder Apply 1 Application topically daily.   omeprazole (PRILOSEC) 20 MG capsule Take 20 mg by mouth in the morning and at bedtime.   oxaprozin (DAYPRO) 600 MG tablet Take 1,200 mg by mouth daily as needed (Gout).   potassium chloride  SA (KLOR-CON  M15) 15 MEQ tablet Take 15 mEq by mouth every Monday, Wednesday, and Friday.   spironolactone (ALDACTONE) 25 MG tablet Take 25 mg by mouth every Monday, Wednesday, and Friday.   timolol  (TIMOPTIC ) 0.5 % ophthalmic solution Place  1 drop into both eyes daily.   TRADJENTA  5 MG TABS tablet Take 5 mg by mouth daily.   Vitamin D, Ergocalciferol, (DRISDOL) 1.25 MG (50000 UNIT) CAPS capsule Take 50,000 Units by mouth every 7 (seven) days.   zinc  gluconate 50 MG tablet Take 50 mg by mouth daily.     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  Tobacco Use   Smoking status: Former    Types: Cigarettes    Passive exposure: Never   Smokeless tobacco: Never  Vaping Use   Vaping status: Never Used  Substance and Sexual Activity   Alcohol use: Yes    Comment: RARELY   Drug use: Never   Sexual activity: Not Currently  Other Topics Concern   Not on file  Social History Narrative   Not on file   Social Drivers of Health   Financial Resource Strain: Not on file  Food Insecurity: Low Risk  (01/04/2024)   Received from [facility]   Hunger Vital Sign    Within the past 12 months, you worried that your food would run out before you got money to buy more: Never true    Within the past 12 months, the food you bought just didn't last and you didn't have money to get more. : Never true  Transportation Needs: No Transportation Needs (01/04/2024)   Received from [facility]    In the past 12 months, has lack of reliable transportation kept you from medical appointments, meetings, work or from getting things needed for daily living? : No  Physical Activity: Not on file  Stress: Not on file  Social Connections: Not on file     Family History: The patient's family history includes Colon cancer in his father; Diabetes in his mother; Healthy in his sister; Hyperlipidemia in his  mother; Hypertension in his mother; Stroke in his mother. ROS:   Please see the history of present illness.    All 14 point review of systems negative except as described per history of present illness.  EKGs/Labs/Other Studies Reviewed:    The following studies were reviewed today:  EKG:       Recent Labs: 03/05/2024: ALT 10; BUN 26; Creatinine, Ser 1.54; Hemoglobin 11.4; Platelets 215; Potassium 4.8; Sodium 137 03/21/2024: Magnesium  1.5  Recent Lipid Panel    Component Value Date/Time   LDLDIRECT 40 03/05/2024 1442    Physical Exam:    VS:  BP 122/60   Pulse (!) 56   Ht 6' 1 (1.854 m)   Wt 285 lb (129.3 kg)   SpO2 95%   BMI 37.60 kg/m     Wt Readings from Last 3 Encounters:  04/18/24 285 lb (129.3 kg)  03/05/24 285 lb (129.3 kg)  01/15/24 289 lb 11.2 oz (131.4 kg)     GENERAL:  Well nourished, well developed in no acute distress. Wheelchair limited.  Requires full assistance with transitioning in and out office wheelchair to the bed for using restroom NECK: No JVD; No carotid bruits CARDIAC: RRR, S1 and S2 present, no murmurs, no rubs, no gallops CHEST:  Clear to auscultation without rales, wheezing or rhonchi Abdomen: Soft, nontender, suprapubic catheter in place Extremities: 1+ pitting pedal edema.. Pulses bilaterally symmetric with radial 2+ and dorsalis pedis 2+ NEUROLOGIC:  Alert and oriented x 3  Medication Adjustments/Labs and Tests Ordered: Current medicines are reviewed at length with the patient today.  Concerns regarding medicines are outlined above.  No orders of the defined types were placed in this encounter.  No orders of the defined types were placed in this encounter.   Signed, Lura Sallies, MD, MPH, [facility]. 04/18/2024 3:24 PM    [facility]

## 2024-04-18 NOTE — Assessment & Plan Note (Signed)
 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  Hypervolemic. Compensated. Functional status extremely limited due to his cerebellar degeneration and wheelchair limited baseline.  Continue with salt restriction to milligram 1/day. Fluid restriction to below 2 L/day. Continue with furosemide  40 mg 4 times a week.  Guideline-directed medical therapy continue with carvedilol  12.5 mg twice daily Continue losartan 100 mg once daily Continue spironolactone 25 mg 3 days a week.  He is also on hydralazine  50 mg 3 times a day and isosorbide dinitrate 10 mg 3 times a day.  Not a candidate for SGLT2 inhibitors due to his mobility and risk for urinary tract infections.

## 2024-04-18 NOTE — H&P (View-Only) (Signed)
 Cardiology Consultation:    Date:  04/18/2024   ID:  Anthony Mueller, DOB [birth year], MRN [id number]  PCP:  Anthony Ota, MD  Cardiologist:  Anthony Evans Arthur Aydelotte, MD   Referring MD: Anthony Ota, MD   No chief complaint on file.    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  Now noted with severe triple-vessel disease on cardiac CT June 2025 with hemodynamically significant lesion in mid LAD and CTO of distal LCx.  Also noted large paraesophageal hiatal hernia.   Discussed results from cardiac CT and in the context of severe hemodynamically significant lesions and cardiomyopathy, to proceed with cardiac catheterization.  Discussed extensively about the cardiac cath contrast use and risk on kidney function.   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  Hypervolemic. Compensated. Functional status extremely limited due to his cerebellar degeneration and wheelchair limited baseline.  Continue with salt restriction to milligram 1/day. Fluid restriction to below 2 L/day. Continue with furosemide  40 mg 4 times a week.  Guideline-directed medical therapy continue with carvedilol  12.5 mg twice daily Continue losartan 100 mg once daily Continue spironolactone 25 mg 3 days a week.  He is also on hydralazine  50 mg 3 times a day and isosorbide dinitrate 10 mg 3 times a day.  Not a candidate for  SGLT2 inhibitors due to his mobility and risk for urinary tract infections.      CAD (coronary artery disease) - Primary   Cardiac CT completed 04/09/2024 noted severe coronary atherosclerosis with calcium  score 3141, plaque volume 2442 mm cube, CAD RADS 4 study with severe stenosis of mid LAD, moderate stenosis of RCA and LCx and CT FFR noted high likelihood of hemodynamically significant stenosis of mid LAD and total occlusion of distal LCx.    In context of known cardiomyopathy, hemodynamically significant lesions, discussed extensively about further management with cardiac catheterization.   Shared Decision Making/Informed Consent{ The risks [stroke (1 in 1000), death (1 in 1000), kidney failure [usually temporary] (1 in 500), bleeding (1 in 200), allergic reaction [possibly serious] (1 in 200)], benefits (diagnostic support and management of coronary artery disease) and alternatives of a cardiac catheterization were discussed in detail with him and his family members today all and they are  willing to proceed.  The request to schedule this tentatively a week of July 6. This I feel is reasonable and should be okay to wait.  If you have any chest pain or acute worsening should head to the ER or call 911 right away.         Return to clinic tentatively in 2 to 3 weeks after his cardiac catheterization.   History of Present Illness:    Anthony Mueller is a 73 y.o. male who is being seen today for follow-up visit. PCP is Anthony Ota, MD. Last visit with our office was 5 04/21/2024 with Anthony Bores, NP-C.  Here for the visit today accompanied by his wife, youngest son, and  his oldest son Anthony Mueller was on further discussion over the speaker phone.  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  Now noted with severe triple-vessel disease on cardiac CT June 2025 with hemodynamically significant lesion in mid LAD and CTO of distal LCx.  Also noted large paraesophageal hiatal hernia.  Earlier in April he was admitted at [facility] for symptoms of reduced p.o. intake abdominal symptoms and poor oral intake and Hospital course was notable for markedly elevated high-sensitivity troponins and mildly reduced LVEF 45 to 50% with regional wall motion abnormalities involving anterior and septal wall segments.  There was note made about severe coronary calcifications.  Unclear what the decision-making was with regards to consideration for further ischemic workup but given any lack of cardiac symptoms of chest pain or shortness of breath at the time he  was arranged for follow-up with cardiology office as outpatient.  This occurred on 03/05/2024 with Anthony Mueller at which time further evaluation for her CAD was done with cardiac CT.  Cardiac CT completed 04/09/2024 noted severe coronary atherosclerosis with calcium  score 3141, plaque volume 2442 mm cube, CAD RADS 4 study with severe stenosis of mid LAD, moderate stenosis of RCA and LCx and CT FFR noted high likelihood of hemodynamically significant stenosis of mid LAD and total occlusion of distal LCx.  In addition there is a large paraesophageal hiatal hernia and subsegmental atelectasis.  EKG in the clinic today shows atrial fibrillation ventricular rate controlled 52/min, QRS duration 92 ms, no significant ST-T changes to suggest ischemia.  T wave inversions in lead III and aVF new in comparison to prior EKG from Mar 05, 2024  Blood work from 03/05/2024 noted BUN 26, creatinine 1.54, EGFR 47, suggest CKD stage III Magnesium  level 1.3 Lipid  panel with direct LDL 40, CBC with hemoglobin 11.4 and hematocrit 33.9 and WBC 11.1 and platelets 215.  Past Medical History:  Diagnosis Date   Accelerated hypertension 03/31/2022   Acute diastolic (congestive) heart failure (HCC) 03/31/2022   Acute on chronic anemia 01/13/2022   AKI (acute kidney injury) (HCC) 01/13/2022   At risk for fall due to comorbid condition 01/20/2022   B12 deficiency 03/01/2022   Bilateral lower extremity edema 01/13/2022   Calculus of gallbladder with acute cholecystitis without obstruction 02/28/2022   Cardiomegaly 11/10/2021   Cerebellar degeneration (HCC)    CHF exacerbation (HCC) 03/31/2022   Cholelithiasis 11/10/2021   Chronic diastolic heart failure (HCC) 03/31/2022   Chronic kidney disease, stage 3a [facility]) 12/28/2023   11/22/2023 Nephrology OV note.     Chronic suprapubic catheter (HCC) 12/08/2022   [facility]     Diabetes mellitus without complication (HCC)    DM type 2 with diabetic mixed hyperlipidemia (HCC) 03/04/2020   Does not transfer from wheelchair to toilet 01/20/2022   Drug therapy 02/02/2016   Elevated antinuclear antibody (ANA) level 10/06/2015   Frail elderly 04/07/2022   Gait disorder 09/07/2016   GERD (gastroesophageal reflux disease)    Gout 08/28/2012   History of 2019 novel coronavirus disease (COVID-19) 10/27/2021   History of colonic polyps 11/14/2011   History of esophageal stricture 05/02/2018   Hypercholesteremia 03/31/2022   Hypercoagulable state  due to persistent atrial fibrillation (HCC) 12/16/2021   Hyperlipidemia    Hypertension    Hypertension, essential, benign 08/28/2012   Hypertensive heart disease 01/05/2022   Hypertensive heart disease with acute on chronic diastolic congestive heart failure (HCC) 03/31/2022   Hypertensive urgency 03/31/2022   Hypokalemia 11/10/2021   Hypomagnesemia 01/13/2022   Intertrigo 09/03/2018   Iron  deficiency anemia due to chronic blood loss 03/01/2022   Long term (current)  use of anticoagulants 12/16/2021   Malaise and fatigue 03/31/2022   Muscular deconditioning 12/23/2021   Obesity (BMI 30-39.9) 03/15/2023   Occult GI bleeding 11/06/2017   OSA (obstructive sleep apnea) 01/13/2022   Formatting of this note might be different from the original. WEARS CPAP   Persistent atrial fibrillation (HCC) 11/13/2021   Formatting of this note might be different from the original. Last Assessment & Plan:  Formatting of this note might be different from the original. - Patient noted to be in atrial fibrillation, bradycardia with sinus pauses -Per cardiology, started on apixaban , will need to be stopped prior to his upcoming rectal surgery (stop 5 days prior to the surgery and then resume once cleared by surgery se   Positive ANA (antinuclear antibody) 10/06/2015   Postoperative anemia due to acute blood loss 01/20/2022   Postoperative examination 02/28/2022   Pulmonary edema 03/31/2022   Rectal bleeding 01/14/2022   RLS (restless legs syndrome) 09/08/2016   Shortness of breath 03/31/2022   Skin cancer    Transaminitis 11/10/2021   Trigger middle finger of left hand 10/07/2015   Type 2 diabetes mellitus with hyperlipidemia (HCC) 11/10/2021   Uninhibited neuropathic bladder, 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   Unspecified atrial fibrillation (HCC) with sinus pauses 11/13/2021   Vitamin D deficiency due to chronic kidney disease 12/29/2023    Past Surgical History:  Procedure Laterality Date   APPENDECTOMY     CATARACT EXTRACTION, BILATERAL     CHOLECYSTECTOMY     HEMORRHOID SURGERY     IR CATHETER TUBE CHANGE  06/06/2022   POLYPECTOMY     ULNAR NERVE REPAIR Left     Current Medications: Current Meds  Medication Sig   acetaminophen  (TYLENOL ) 500 MG tablet Take 500-1,000 mg by mouth every 8 (eight) hours as needed for moderate pain or headache.   albuterol  (VENTOLIN  HFA) 108 (90 Base) MCG/ACT inhaler Inhale 2 puffs into the lungs every 6 (six) hours as needed for  wheezing or shortness of breath.   allopurinol  (ZYLOPRIM ) 100 MG tablet Take 100 mg by mouth in the morning.   Ascorbic Acid  (VITAMIN C) 1000 MG tablet Take 1,000 mg by mouth daily.   aspirin  EC 81 MG tablet Take 81 mg by mouth daily.   carvedilol  (COREG ) 12.5 MG tablet Take 1 tablet (12.5 mg total) by mouth 2 (two) times daily.   cloNIDine  (CATAPRES ) 0.3 MG tablet Take 0.3 mg by mouth 3 (three) times daily.   cyanocobalamin  (VITAMIN B12) 1000 MCG/ML injection Inject 1,000 mcg into the muscle every 30 (thirty) days.   finasteride (PROSCAR) 5 MG tablet Take 5 mg by mouth daily.   furosemide  (LASIX ) 40 MG tablet Take 40 mg by mouth every Monday, Wednesday, and Friday. Take 40mg  by mouth every Monday, Wednesday, Friday and Saturday   hydrALAZINE  (APRESOLINE ) 25 MG tablet Take 50 mg by mouth in the morning, at noon, and at bedtime.   isosorbide dinitrate (ISORDIL) 10 MG tablet Take 10 mg by mouth 3 (three) times daily.   losartan (COZAAR) 100 MG tablet Take  100 mg by mouth daily.   lovastatin (MEVACOR) 40 MG tablet Take 40 mg by mouth every evening.   Magnesium  Oxide -Mg Supplement 400 MG CAPS Take 800 mg by mouth daily.   metFORMIN (GLUCOPHAGE) 500 MG tablet Take 1,000 mg by mouth 2 (two) times daily.   mupirocin  ointment (BACTROBAN ) 2 % Apply 1 Application topically daily.   nitroGLYCERIN  (NITROSTAT ) 0.4 MG SL tablet Place 1 tablet (0.4 mg total) under the tongue every 5 (five) minutes as needed.   nystatin (MYCOSTATIN/NYSTOP) powder Apply 1 Application topically daily.   omeprazole (PRILOSEC) 20 MG capsule Take 20 mg by mouth in the morning and at bedtime.   oxaprozin (DAYPRO) 600 MG tablet Take 1,200 mg by mouth daily as needed (Gout).   potassium chloride  SA (KLOR-CON  M15) 15 MEQ tablet Take 15 mEq by mouth every Monday, Wednesday, and Friday.   spironolactone (ALDACTONE) 25 MG tablet Take 25 mg by mouth every Monday, Wednesday, and Friday.   timolol  (TIMOPTIC ) 0.5 % ophthalmic solution Place  1 drop into both eyes daily.   TRADJENTA  5 MG TABS tablet Take 5 mg by mouth daily.   Vitamin D, Ergocalciferol, (DRISDOL) 1.25 MG (50000 UNIT) CAPS capsule Take 50,000 Units by mouth every 7 (seven) days.   zinc  gluconate 50 MG tablet Take 50 mg by mouth daily.     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  Tobacco Use   Smoking status: Former    Types: Cigarettes    Passive exposure: Never   Smokeless tobacco: Never  Vaping Use   Vaping status: Never Used  Substance and Sexual Activity   Alcohol use: Yes    Comment: RARELY   Drug use: Never   Sexual activity: Not Currently  Other Topics Concern   Not on file  Social History Narrative   Not on file   Social Drivers of Health   Financial Resource Strain: Not on file  Food Insecurity: Low Risk  (01/04/2024)   Received from [facility]   Hunger Vital Sign    Within the past 12 months, you worried that your food would run out before you got money to buy more: Never true    Within the past 12 months, the food you bought just didn't last and you didn't have money to get more. : Never true  Transportation Needs: No Transportation Needs (01/04/2024)   Received from [facility]    In the past 12 months, has lack of reliable transportation kept you from medical appointments, meetings, work or from getting things needed for daily living? : No  Physical Activity: Not on file  Stress: Not on file  Social Connections: Not on file     Family History: The patient's family history includes Colon cancer in his father; Diabetes in his mother; Healthy in his sister; Hyperlipidemia in his  mother; Hypertension in his mother; Stroke in his mother. ROS:   Please see the history of present illness.    All 14 point review of systems negative except as described per history of present illness.  EKGs/Labs/Other Studies Reviewed:    The following studies were reviewed today:  EKG:       Recent Labs: 03/05/2024: ALT 10; BUN 26; Creatinine, Ser 1.54; Hemoglobin 11.4; Platelets 215; Potassium 4.8; Sodium 137 03/21/2024: Magnesium  1.5  Recent Lipid Panel    Component Value Date/Time   LDLDIRECT 40 03/05/2024 1442    Physical Exam:    VS:  BP 122/60   Pulse (!) 56   Ht 6' 1 (1.854 m)   Wt 285 lb (129.3 kg)   SpO2 95%   BMI 37.60 kg/m     Wt Readings from Last 3 Encounters:  04/18/24 285 lb (129.3 kg)  03/05/24 285 lb (129.3 kg)  01/15/24 289 lb 11.2 oz (131.4 kg)     GENERAL:  Well nourished, well developed in no acute distress. Wheelchair limited.  Requires full assistance with transitioning in and out office wheelchair to the bed for using restroom NECK: No JVD; No carotid bruits CARDIAC: RRR, S1 and S2 present, no murmurs, no rubs, no gallops CHEST:  Clear to auscultation without rales, wheezing or rhonchi Abdomen: Soft, nontender, suprapubic catheter in place Extremities: 1+ pitting pedal edema.. Pulses bilaterally symmetric with radial 2+ and dorsalis pedis 2+ NEUROLOGIC:  Alert and oriented x 3  Medication Adjustments/Labs and Tests Ordered: Current medicines are reviewed at length with the patient today.  Concerns regarding medicines are outlined above.  No orders of the defined types were placed in this encounter.  No orders of the defined types were placed in this encounter.   Signed, Lura Sallies, MD, MPH, [facility]. 04/18/2024 3:24 PM    [facility]

## 2024-04-18 NOTE — Patient Instructions (Signed)
 Medication Instructions:  Your physician recommends that you continue on your current medications as directed. Please refer to the Current Medication list given to you today.  *If you need a refill on your cardiac medications before your next appointment, please call your pharmacy*  Lab Work: Your physician recommends that you return for lab work in:   Labs today: BMP, CBC  If you have labs (blood work) drawn today and your tests are completely normal, you will receive your results only by: MyChart Message (if you have MyChart) OR A paper copy in the mail If you have any lab test that is abnormal or we need to change your treatment, we will call you to review the results.  Testing/Procedures:  [place] [facility] DEPT OF [place] AT [address] [place] [address] Dept: [phone] Loc: [phone]  Brandun Pinn [facility]  04/18/2024  You are scheduled for a Cardiac Catheterization on Tuesday, July 8 with Dr. Randene Bustard.  1. Please arrive at the [facility] (Main Entrance A) at [facility]: [address] [place], [place] [address] at 7:00 AM (This time is 2 hour(s) before your procedure to ensure your preparation).   Free valet parking service is available. You will check in at ADMITTING. The support person will be asked to wait in the waiting room.  It is OK to have someone drop you off and come back when you are ready to be discharged.    Special note: Every effort is made to have your procedure done on time. Please understand that emergencies sometimes delay scheduled procedures.  2. Diet: Do not eat solid foods after midnight.  The patient may have clear liquids until 5am upon the day of the procedure.  3. Labs: You will need to have blood drawn on Thursday, June 19 at [facility]: [address], [place], advertising . You do not need to be fasting.  4. Medication instructions in preparation for your  procedure:   Contrast Allergy: No  Hold Lasix  and Aldactone the morning of the procedure  Do not take Tradjenta  on the morning of your procedure  Do not take Diabetes Med Glucophage (Metformin) on the day of the procedure and HOLD 48 HOURS AFTER THE PROCEDURE.  On the morning of your procedure, take your Aspirin  81 mg and any morning medicines NOT listed above.  You may use sips of water.  5. Plan to go home the same day, you will only stay overnight if medically necessary. 6. Bring a current list of your medications and current insurance cards. 7. You MUST have a responsible person to drive you home. 8. Someone MUST be with you the first 24 hours after you arrive home or your discharge will be delayed. 9. Please wear clothes that are easy to get on and off and wear slip-on shoes.  Thank you for allowing us  to care for you!   -- [place] [facility]   Follow-Up: At [facility], you and your health needs are our priority.  As part of our continuing mission to provide you with exceptional heart care, our providers are all part of one team.  This team includes your primary Cardiologist (physician) and Advanced Practice Providers or APPs (Physician Assistants and Nurse Practitioners) who all work together to provide you with the care you need, when you need it.  Your next appointment:   Follow up to be determined based on results of  cardiac testing  Provider:   Bertha Broad, MD    We recommend signing up for the patient portal called MyChart.  Sign up information is provided on this After Visit Summary.  MyChart is used to connect with patients for Virtual Visits (Telemedicine).  Patients are able to view lab/test results, encounter notes, upcoming appointments, etc.  Non-urgent messages can be sent to your provider as well.   To learn more about what you can do with MyChart, go to [email or web address].   Other Instructions None

## 2024-04-19 LAB — CBC
Hematocrit: 36.5 % — ABNORMAL LOW (ref 37.5–51.0)
Hemoglobin: 11.9 g/dL — ABNORMAL LOW (ref 13.0–17.7)
MCH: 31.4 pg (ref 26.6–33.0)
MCHC: 32.6 g/dL (ref 31.5–35.7)
MCV: 96 fL (ref 79–97)
Platelets: 203 10*3/uL (ref 150–450)
RBC: 3.79 x10E6/uL — ABNORMAL LOW (ref 4.14–5.80)
RDW: 14.1 % (ref 11.6–15.4)
WBC: 11.7 10*3/uL — ABNORMAL HIGH (ref 3.4–10.8)

## 2024-04-19 LAB — BASIC METABOLIC PANEL WITH GFR
BUN/Creatinine Ratio: 21 (ref 10–24)
BUN: 32 mg/dL — ABNORMAL HIGH (ref 8–27)
CO2: 21 mmol/L (ref 20–29)
Calcium: 9.3 mg/dL (ref 8.6–10.2)
Chloride: 96 mmol/L (ref 96–106)
Creatinine, Ser: 1.53 mg/dL — ABNORMAL HIGH (ref 0.76–1.27)
Glucose: 141 mg/dL — ABNORMAL HIGH (ref 70–99)
Potassium: 5.2 mmol/L (ref 3.5–5.2)
Sodium: 135 mmol/L (ref 134–144)
eGFR: 47 mL/min/{1.73_m2} — ABNORMAL LOW (ref >59)

## 2024-04-26 ENCOUNTER — Telehealth: Payer: Self-pay | Admitting: Cardiology

## 2024-04-26 NOTE — Telephone Encounter (Signed)
 Recommendations reviewed with pt as per Anthony Hoover NP'S note.  Pt verbalized understanding and had no additional questions.

## 2024-04-26 NOTE — Telephone Encounter (Signed)
 Blood pressure log looks great!

## 2024-05-02 ENCOUNTER — Telehealth: Payer: Self-pay | Admitting: Cardiology

## 2024-05-02 NOTE — Telephone Encounter (Signed)
 Adam states that the pt has mobility needs. Advised that the procedure will be in the hospital and they have equipment to assist.

## 2024-05-02 NOTE — Telephone Encounter (Signed)
 Son Lemuel) called to report that patient will need assistance with mobility for his procedure on 7/8.  Son stated patient will be transported with the sling for a Hoyer lift if available.

## 2024-05-06 ENCOUNTER — Telehealth: Payer: Self-pay | Admitting: *Deleted

## 2024-05-06 ENCOUNTER — Ambulatory Visit: Admitting: Podiatry

## 2024-05-06 NOTE — Telephone Encounter (Addendum)
 Cardiac Catheterization scheduled at [facility] for: Tuesday May 07, 2024 9 AM Arrival time [facility] Main Entrance A at: 7 AM  Nothing to eat after midnight prior to procedure, clear liquids until 5 AM day of procedure.  Medication instructions: -Hold:  Losartan/Lasix /Spironolactone/KCl-day before and day of procedure-per protocol eGFR, 60 (47)-pt already taken today.  Metformin-day of procedure and 48 hours post procedure  Tradjenta -AM of procedure -Other usual morning medications can be taken with sips of water including aspirin  81 mg.  Plan to go home the same day, you will only stay overnight if medically necessary.  You must have responsible adult to drive you home.  Someone must be with you the first 24 hours after you arrive home.  Reviewed procedure instructions with patient and patient's wife. Patient's wife tells me patient is wheelchair user,  patient will require Deitra lift to transfer. I discussed patient staying well hydrated with patient's wife.

## 2024-05-07 ENCOUNTER — Ambulatory Visit (HOSPITAL_COMMUNITY)
Admission: RE | Admit: 2024-05-07 | Discharge: 2024-05-07 | Disposition: A | Discharge status: Home / Self Care | Attending: Cardiology | Admitting: Cardiology

## 2024-05-07 ENCOUNTER — Encounter (HOSPITAL_COMMUNITY): Payer: Self-pay | Admitting: Cardiology

## 2024-05-07 ENCOUNTER — Other Ambulatory Visit: Payer: Self-pay

## 2024-05-07 ENCOUNTER — Encounter (HOSPITAL_COMMUNITY)
Admission: RE | Disposition: A | Discharge status: Home / Self Care | Payer: Self-pay | Source: Home / Self Care | Attending: Cardiology

## 2024-05-07 DIAGNOSIS — G4733 Obstructive sleep apnea (adult) (pediatric): Secondary | ICD-10-CM | POA: Insufficient documentation

## 2024-05-07 DIAGNOSIS — D6869 Other thrombophilia: Secondary | ICD-10-CM | POA: Diagnosis not present

## 2024-05-07 DIAGNOSIS — Z9359 Other cystostomy status: Secondary | ICD-10-CM | POA: Insufficient documentation

## 2024-05-07 DIAGNOSIS — I2584 Coronary atherosclerosis due to calcified coronary lesion: Secondary | ICD-10-CM | POA: Insufficient documentation

## 2024-05-07 DIAGNOSIS — E785 Hyperlipidemia, unspecified: Secondary | ICD-10-CM | POA: Diagnosis not present

## 2024-05-07 DIAGNOSIS — I4821 Permanent atrial fibrillation: Secondary | ICD-10-CM | POA: Insufficient documentation

## 2024-05-07 DIAGNOSIS — Z7982 Long term (current) use of aspirin: Secondary | ICD-10-CM | POA: Diagnosis not present

## 2024-05-07 DIAGNOSIS — Z79899 Other long term (current) drug therapy: Secondary | ICD-10-CM | POA: Diagnosis not present

## 2024-05-07 DIAGNOSIS — I2510 Atherosclerotic heart disease of native coronary artery without angina pectoris: Secondary | ICD-10-CM

## 2024-05-07 DIAGNOSIS — Z993 Dependence on wheelchair: Secondary | ICD-10-CM | POA: Insufficient documentation

## 2024-05-07 DIAGNOSIS — Z7984 Long term (current) use of oral hypoglycemic drugs: Secondary | ICD-10-CM | POA: Insufficient documentation

## 2024-05-07 DIAGNOSIS — Z87891 Personal history of nicotine dependence: Secondary | ICD-10-CM | POA: Diagnosis not present

## 2024-05-07 DIAGNOSIS — N1831 Chronic kidney disease, stage 3a: Secondary | ICD-10-CM | POA: Diagnosis not present

## 2024-05-07 DIAGNOSIS — M109 Gout, unspecified: Secondary | ICD-10-CM | POA: Diagnosis not present

## 2024-05-07 DIAGNOSIS — K219 Gastro-esophageal reflux disease without esophagitis: Secondary | ICD-10-CM | POA: Diagnosis not present

## 2024-05-07 DIAGNOSIS — E1122 Type 2 diabetes mellitus with diabetic chronic kidney disease: Secondary | ICD-10-CM | POA: Insufficient documentation

## 2024-05-07 DIAGNOSIS — G319 Degenerative disease of nervous system, unspecified: Secondary | ICD-10-CM | POA: Diagnosis not present

## 2024-05-07 DIAGNOSIS — I130 Hypertensive heart and chronic kidney disease with heart failure and stage 1 through stage 4 chronic kidney disease, or unspecified chronic kidney disease: Secondary | ICD-10-CM | POA: Insufficient documentation

## 2024-05-07 DIAGNOSIS — K449 Diaphragmatic hernia without obstruction or gangrene: Secondary | ICD-10-CM | POA: Diagnosis not present

## 2024-05-07 DIAGNOSIS — I2582 Chronic total occlusion of coronary artery: Secondary | ICD-10-CM | POA: Diagnosis not present

## 2024-05-07 HISTORY — PX: LEFT HEART CATH AND CORONARY ANGIOGRAPHY: CATH118249

## 2024-05-07 LAB — GLUCOSE, CAPILLARY
Glucose-Capillary: 210 mg/dL — ABNORMAL HIGH (ref 70–99)
Glucose-Capillary: 171 mg/dL — ABNORMAL HIGH (ref 70–99)

## 2024-05-07 SURGERY — LEFT HEART CATH AND CORONARY ANGIOGRAPHY
Anesthesia: LOCAL

## 2024-05-07 MED ORDER — ONDANSETRON HCL 4 MG/2ML IJ SOLN: 4.0000 mg | Freq: Four times a day (QID) | INTRAMUSCULAR | Status: DC | PRN

## 2024-05-07 MED ORDER — VERAPAMIL HCL 2.5 MG/ML IV SOLN
INTRAVENOUS | Status: AC
  Filled 2024-05-07: qty 2

## 2024-05-07 MED ORDER — NITROGLYCERIN 1 MG/10 ML FOR IR/CATH LAB
INTRA_ARTERIAL | Status: DC | PRN
  Administered 2024-05-07: 200 ug via INTRACORONARY

## 2024-05-07 MED ORDER — SODIUM CHLORIDE 0.9% FLUSH: 3.0000 mL | INTRAVENOUS | Status: DC | PRN

## 2024-05-07 MED ORDER — ACETAMINOPHEN 325 MG PO TABS
650.0000 mg | ORAL_TABLET | ORAL | Status: DC | PRN
Start: 2024-05-07 — End: 2024-05-07

## 2024-05-07 MED ORDER — LIDOCAINE HCL (PF) 1 % IJ SOLN
INTRAMUSCULAR | Status: DC | PRN
  Administered 2024-05-07: 2 mL via INTRADERMAL

## 2024-05-07 MED ORDER — SODIUM CHLORIDE 0.9 % IV SOLN: 250.0000 mL | INTRAVENOUS | Status: DC | PRN

## 2024-05-07 MED ORDER — HEPARIN SODIUM (PORCINE) 1000 UNIT/ML IJ SOLN
INTRAMUSCULAR | Status: AC
  Filled 2024-05-07: qty 10

## 2024-05-07 MED ORDER — SODIUM CHLORIDE 0.9% FLUSH: 3.0000 mL | Freq: Two times a day (BID) | INTRAVENOUS | Status: DC

## 2024-05-07 MED ORDER — MIDAZOLAM HCL 2 MG/2ML IJ SOLN
INTRAMUSCULAR | Status: DC | PRN
  Administered 2024-05-07: 1 mg via INTRAVENOUS

## 2024-05-07 MED ORDER — HYDRALAZINE HCL 20 MG/ML IJ SOLN: 10.0000 mg | INTRAMUSCULAR | Status: DC | PRN

## 2024-05-07 MED ORDER — NITROGLYCERIN 1 MG/10 ML FOR IR/CATH LAB
INTRA_ARTERIAL | Status: AC
  Filled 2024-05-07: qty 10

## 2024-05-07 MED ORDER — SODIUM CHLORIDE 0.9 % WEIGHT BASED INFUSION: 1.0000 mL/kg/h | INTRAVENOUS | Status: DC

## 2024-05-07 MED ORDER — VERAPAMIL HCL 2.5 MG/ML IV SOLN
INTRAVENOUS | Status: DC | PRN
  Administered 2024-05-07: 10 mL via INTRA_ARTERIAL

## 2024-05-07 MED ORDER — SODIUM CHLORIDE 0.9 % WEIGHT BASED INFUSION
3.0000 mL/kg/h | INTRAVENOUS | Status: AC
  Administered 2024-05-07: 3 mL/kg/h via INTRAVENOUS

## 2024-05-07 MED ORDER — HEPARIN (PORCINE) IN NACL 1000-0.9 UT/500ML-% IV SOLN
INTRAVENOUS | Status: DC | PRN
  Administered 2024-05-07 (×2): 500 mL

## 2024-05-07 MED ORDER — IOHEXOL 350 MG/ML SOLN
INTRAVENOUS | Status: DC | PRN
  Administered 2024-05-07: 75 mL

## 2024-05-07 MED ORDER — LIDOCAINE HCL (PF) 1 % IJ SOLN
INTRAMUSCULAR | Status: AC
  Filled 2024-05-07: qty 30

## 2024-05-07 MED ORDER — HEPARIN SODIUM (PORCINE) 1000 UNIT/ML IJ SOLN
INTRAMUSCULAR | Status: DC | PRN
  Administered 2024-05-07: 6000 [IU] via INTRAVENOUS

## 2024-05-07 MED ORDER — MIDAZOLAM HCL 2 MG/2ML IJ SOLN
INTRAMUSCULAR | Status: AC
  Filled 2024-05-07: qty 2

## 2024-05-07 MED ORDER — LABETALOL HCL 5 MG/ML IV SOLN: 10.0000 mg | INTRAVENOUS | Status: DC | PRN

## 2024-05-07 SURGICAL SUPPLY — 9 items
CATH 5FR JL3.5 JR4 ANG PIG MP (CATHETERS) IMPLANT
DEVICE RAD COMP TR BAND LRG (VASCULAR PRODUCTS) IMPLANT
GLIDESHEATH SLEND SS 6F .021 (SHEATH) IMPLANT
GUIDEWIRE INQWIRE 1.5J.035X260 (WIRE) IMPLANT
MAT PREVALON FULL STRYKER (MISCELLANEOUS) IMPLANT
PACK CARDIAC CATHETERIZATION (CUSTOM PROCEDURE TRAY) ×2 IMPLANT
SET ATX-X65L (MISCELLANEOUS) IMPLANT
SHEATH PROBE COVER 6X72 (BAG) IMPLANT
WIRE HI TORQ VERSACORE-J 145CM (WIRE) IMPLANT

## 2024-05-07 NOTE — Interval H&P Note (Signed)
 History and Physical Interval Note:  05/07/2024 12:31 PM  Anthony Mueller [facility]  has presented today for surgery, with the diagnosis of cad -with abnormal Coronary CTA.  The various methods of treatment have been discussed with the patient and family. After consideration of risks, benefits and other options for treatment, the patient has consented to  Procedure(s): LEFT HEART CATH AND CORONARY ANGIOGRAPHY (N/A) PERCUTANEOUS CORONARY INTERVENTION    as a surgical intervention.  The patient's history has been reviewed, patient examined, no change in status, stable for surgery.  I have reviewed the patient's chart and labs.  Questions were answered to the patient's satisfaction.    Cath Lab Visit (complete for each Cath Lab visit)  Clinical Evaluation Leading to the Procedure:   ACS: No.  Non-ACS:    Anginal Classification: CCS III  Anti-ischemic medical therapy: Minimal Therapy (1 class of medications)  Non-Invasive Test Results: High-risk stress test findings: cardiac mortality >3%/year  Prior CABG: No previous CABG     Anthony Mueller

## 2024-05-07 NOTE — Discharge Instructions (Signed)

## 2024-05-13 ENCOUNTER — Ambulatory Visit: Admitting: Podiatry

## 2024-05-16 ENCOUNTER — Emergency Department (HOSPITAL_COMMUNITY)

## 2024-05-16 ENCOUNTER — Other Ambulatory Visit: Payer: Self-pay

## 2024-05-16 ENCOUNTER — Inpatient Hospital Stay (HOSPITAL_COMMUNITY)
Admission: EM | Admit: 2024-05-16 | Discharge: 2024-05-18 | DRG: 698 | Disposition: A | Discharge status: Home Health | Attending: Internal Medicine | Admitting: Internal Medicine

## 2024-05-16 ENCOUNTER — Encounter (HOSPITAL_COMMUNITY): Payer: Self-pay

## 2024-05-16 DIAGNOSIS — E66812 Obesity, class 2: Secondary | ICD-10-CM | POA: Diagnosis present

## 2024-05-16 DIAGNOSIS — I2510 Atherosclerotic heart disease of native coronary artery without angina pectoris: Secondary | ICD-10-CM | POA: Diagnosis present

## 2024-05-16 DIAGNOSIS — I080 Rheumatic disorders of both mitral and aortic valves: Secondary | ICD-10-CM | POA: Diagnosis present

## 2024-05-16 DIAGNOSIS — N319 Neuromuscular dysfunction of bladder, unspecified: Secondary | ICD-10-CM | POA: Diagnosis present

## 2024-05-16 DIAGNOSIS — I160 Hypertensive urgency: Secondary | ICD-10-CM | POA: Diagnosis present

## 2024-05-16 DIAGNOSIS — Z8249 Family history of ischemic heart disease and other diseases of the circulatory system: Secondary | ICD-10-CM

## 2024-05-16 DIAGNOSIS — E119 Type 2 diabetes mellitus without complications: Secondary | ICD-10-CM

## 2024-05-16 DIAGNOSIS — E782 Mixed hyperlipidemia: Secondary | ICD-10-CM | POA: Diagnosis present

## 2024-05-16 DIAGNOSIS — E871 Hypo-osmolality and hyponatremia: Secondary | ICD-10-CM | POA: Diagnosis present

## 2024-05-16 DIAGNOSIS — Z6836 Body mass index (BMI) 36.0-36.9, adult: Secondary | ICD-10-CM

## 2024-05-16 DIAGNOSIS — H02401 Unspecified ptosis of right eyelid: Secondary | ICD-10-CM | POA: Diagnosis present

## 2024-05-16 DIAGNOSIS — N390 Urinary tract infection, site not specified: Secondary | ICD-10-CM | POA: Diagnosis present

## 2024-05-16 DIAGNOSIS — N3000 Acute cystitis without hematuria: Secondary | ICD-10-CM | POA: Diagnosis present

## 2024-05-16 DIAGNOSIS — G9341 Metabolic encephalopathy: Secondary | ICD-10-CM | POA: Diagnosis present

## 2024-05-16 DIAGNOSIS — I10 Essential (primary) hypertension: Secondary | ICD-10-CM

## 2024-05-16 DIAGNOSIS — Z87891 Personal history of nicotine dependence: Secondary | ICD-10-CM

## 2024-05-16 DIAGNOSIS — G2581 Restless legs syndrome: Secondary | ICD-10-CM | POA: Diagnosis present

## 2024-05-16 DIAGNOSIS — Z9359 Other cystostomy status: Secondary | ICD-10-CM

## 2024-05-16 DIAGNOSIS — Z823 Family history of stroke: Secondary | ICD-10-CM

## 2024-05-16 DIAGNOSIS — T83518A Infection and inflammatory reaction due to other urinary catheter, initial encounter: Principal | ICD-10-CM | POA: Diagnosis present

## 2024-05-16 DIAGNOSIS — Z800 Family history of malignant neoplasm of digestive organs: Secondary | ICD-10-CM

## 2024-05-16 DIAGNOSIS — Z888 Allergy status to other drugs, medicaments and biological substances status: Secondary | ICD-10-CM

## 2024-05-16 DIAGNOSIS — R7989 Other specified abnormal findings of blood chemistry: Secondary | ICD-10-CM

## 2024-05-16 DIAGNOSIS — Z8616 Personal history of COVID-19: Secondary | ICD-10-CM

## 2024-05-16 DIAGNOSIS — Z7901 Long term (current) use of anticoagulants: Secondary | ICD-10-CM

## 2024-05-16 DIAGNOSIS — Z66 Do not resuscitate: Secondary | ICD-10-CM | POA: Diagnosis present

## 2024-05-16 DIAGNOSIS — R001 Bradycardia, unspecified: Secondary | ICD-10-CM | POA: Diagnosis present

## 2024-05-16 DIAGNOSIS — Z85828 Personal history of other malignant neoplasm of skin: Secondary | ICD-10-CM

## 2024-05-16 DIAGNOSIS — E875 Hyperkalemia: Secondary | ICD-10-CM | POA: Diagnosis present

## 2024-05-16 DIAGNOSIS — G4733 Obstructive sleep apnea (adult) (pediatric): Secondary | ICD-10-CM | POA: Diagnosis present

## 2024-05-16 DIAGNOSIS — A419 Sepsis, unspecified organism: Principal | ICD-10-CM

## 2024-05-16 DIAGNOSIS — Z9841 Cataract extraction status, right eye: Secondary | ICD-10-CM

## 2024-05-16 DIAGNOSIS — Z83438 Family history of other disorder of lipoprotein metabolism and other lipidemia: Secondary | ICD-10-CM

## 2024-05-16 DIAGNOSIS — E1169 Type 2 diabetes mellitus with other specified complication: Secondary | ICD-10-CM | POA: Diagnosis present

## 2024-05-16 DIAGNOSIS — Z9049 Acquired absence of other specified parts of digestive tract: Secondary | ICD-10-CM

## 2024-05-16 DIAGNOSIS — Z993 Dependence on wheelchair: Secondary | ICD-10-CM

## 2024-05-16 DIAGNOSIS — I130 Hypertensive heart and chronic kidney disease with heart failure and stage 1 through stage 4 chronic kidney disease, or unspecified chronic kidney disease: Secondary | ICD-10-CM | POA: Diagnosis present

## 2024-05-16 DIAGNOSIS — K219 Gastro-esophageal reflux disease without esophagitis: Secondary | ICD-10-CM | POA: Diagnosis present

## 2024-05-16 DIAGNOSIS — Y846 Urinary catheterization as the cause of abnormal reaction of the patient, or of later complication, without mention of misadventure at the time of the procedure: Secondary | ICD-10-CM | POA: Diagnosis present

## 2024-05-16 DIAGNOSIS — E860 Dehydration: Secondary | ICD-10-CM | POA: Diagnosis present

## 2024-05-16 DIAGNOSIS — R4182 Altered mental status, unspecified: Secondary | ICD-10-CM

## 2024-05-16 DIAGNOSIS — Z7984 Long term (current) use of oral hypoglycemic drugs: Secondary | ICD-10-CM

## 2024-05-16 DIAGNOSIS — Z79899 Other long term (current) drug therapy: Secondary | ICD-10-CM

## 2024-05-16 DIAGNOSIS — E872 Acidosis, unspecified: Secondary | ICD-10-CM | POA: Diagnosis present

## 2024-05-16 DIAGNOSIS — Z960 Presence of urogenital implants: Secondary | ICD-10-CM

## 2024-05-16 DIAGNOSIS — Z7982 Long term (current) use of aspirin: Secondary | ICD-10-CM

## 2024-05-16 DIAGNOSIS — N1831 Chronic kidney disease, stage 3a: Secondary | ICD-10-CM | POA: Diagnosis present

## 2024-05-16 DIAGNOSIS — I4819 Other persistent atrial fibrillation: Secondary | ICD-10-CM | POA: Diagnosis present

## 2024-05-16 DIAGNOSIS — I5022 Chronic systolic (congestive) heart failure: Secondary | ICD-10-CM | POA: Diagnosis present

## 2024-05-16 DIAGNOSIS — Z833 Family history of diabetes mellitus: Secondary | ICD-10-CM

## 2024-05-16 DIAGNOSIS — M109 Gout, unspecified: Secondary | ICD-10-CM | POA: Diagnosis present

## 2024-05-16 DIAGNOSIS — D631 Anemia in chronic kidney disease: Secondary | ICD-10-CM | POA: Diagnosis present

## 2024-05-16 DIAGNOSIS — E1122 Type 2 diabetes mellitus with diabetic chronic kidney disease: Secondary | ICD-10-CM | POA: Diagnosis present

## 2024-05-16 DIAGNOSIS — Z860100 Personal history of colon polyps, unspecified: Secondary | ICD-10-CM

## 2024-05-16 DIAGNOSIS — Z9842 Cataract extraction status, left eye: Secondary | ICD-10-CM

## 2024-05-16 LAB — I-STAT VENOUS BLOOD GAS, ED
Acid-Base Excess: 2 mmol/L (ref 0.0–2.0)
Bicarbonate: 28.3 mmol/L — ABNORMAL HIGH (ref 20.0–28.0)
Calcium, Ion: 1.19 mmol/L (ref 1.15–1.40)
HCT: 38 % — ABNORMAL LOW (ref 39.0–52.0)
Hemoglobin: 12.9 g/dL — ABNORMAL LOW (ref 13.0–17.0)
O2 Saturation: 49 %
Potassium: 4.9 mmol/L (ref 3.5–5.1)
Sodium: 133 mmol/L — ABNORMAL LOW (ref 135–145)
TCO2: 30 mmol/L (ref 22–32)
pCO2, Ven: 52 mmHg (ref 44–60)
pH, Ven: 7.344 (ref 7.25–7.43)
pO2, Ven: 29 mmHg — CL (ref 32–45)

## 2024-05-16 LAB — URINALYSIS, W/ REFLEX TO CULTURE (INFECTION SUSPECTED)
Bilirubin Urine: NEGATIVE
Glucose, UA: NEGATIVE mg/dL
Hgb urine dipstick: NEGATIVE
Ketones, ur: NEGATIVE mg/dL
Nitrite: NEGATIVE
Protein, ur: NEGATIVE mg/dL
Specific Gravity, Urine: 1.009 (ref 1.005–1.030)
pH: 6 (ref 5.0–8.0)

## 2024-05-16 LAB — CBC WITH DIFFERENTIAL/PLATELET
Abs Immature Granulocytes: 0.15 K/uL — ABNORMAL HIGH (ref 0.00–0.07)
Basophils Absolute: 0.1 K/uL (ref 0.0–0.1)
Basophils Relative: 1 %
Eosinophils Absolute: 0.4 K/uL (ref 0.0–0.5)
Eosinophils Relative: 3 %
HCT: 36.6 % — ABNORMAL LOW (ref 39.0–52.0)
Hemoglobin: 12.2 g/dL — ABNORMAL LOW (ref 13.0–17.0)
Immature Granulocytes: 1 %
Lymphocytes Relative: 10 %
Lymphs Abs: 1.4 K/uL (ref 0.7–4.0)
MCH: 31.5 pg (ref 26.0–34.0)
MCHC: 33.3 g/dL (ref 30.0–36.0)
MCV: 94.6 fL (ref 80.0–100.0)
Monocytes Absolute: 1.1 K/uL — ABNORMAL HIGH (ref 0.1–1.0)
Monocytes Relative: 8 %
Neutro Abs: 10.9 K/uL — ABNORMAL HIGH (ref 1.7–7.7)
Neutrophils Relative %: 77 %
Platelets: 222 K/uL (ref 150–400)
RBC: 3.87 MIL/uL — ABNORMAL LOW (ref 4.22–5.81)
RDW: 14.3 % (ref 11.5–15.5)
WBC: 14.1 K/uL — ABNORMAL HIGH (ref 4.0–10.5)
nRBC: 0 % (ref 0.0–0.2)

## 2024-05-16 LAB — TROPONIN I (HIGH SENSITIVITY)
Troponin I (High Sensitivity): 19 ng/L — ABNORMAL HIGH (ref <18)
Troponin I (High Sensitivity): 19 ng/L — ABNORMAL HIGH (ref <18)

## 2024-05-16 LAB — HEPATIC FUNCTION PANEL
ALT: 14 U/L (ref 0–44)
AST: 17 U/L (ref 15–41)
Albumin: 3.3 g/dL — ABNORMAL LOW (ref 3.5–5.0)
Alkaline Phosphatase: 39 U/L (ref 38–126)
Bilirubin, Direct: <0.1 mg/dL (ref 0.0–0.2)
Total Bilirubin: 0.6 mg/dL (ref 0.0–1.2)
Total Protein: 7.5 g/dL (ref 6.5–8.1)

## 2024-05-16 LAB — LIPASE, BLOOD: Lipase: 26 U/L (ref 11–51)

## 2024-05-16 LAB — BASIC METABOLIC PANEL WITH GFR
Anion gap: 13 (ref 5–15)
BUN: 26 mg/dL — ABNORMAL HIGH (ref 8–23)
CO2: 23 mmol/L (ref 22–32)
Calcium: 9.5 mg/dL (ref 8.9–10.3)
Chloride: 95 mmol/L — ABNORMAL LOW (ref 98–111)
Creatinine, Ser: 1.5 mg/dL — ABNORMAL HIGH (ref 0.61–1.24)
GFR, Estimated: 49 mL/min — ABNORMAL LOW (ref >60)
Glucose, Bld: 201 mg/dL — ABNORMAL HIGH (ref 70–99)
Potassium: 5.6 mmol/L — ABNORMAL HIGH (ref 3.5–5.1)
Sodium: 131 mmol/L — ABNORMAL LOW (ref 135–145)

## 2024-05-16 LAB — I-STAT CG4 LACTIC ACID, ED
Lactic Acid, Venous: 2.9 mmol/L (ref 0.5–1.9)
Lactic Acid, Venous: 3.5 mmol/L (ref 0.5–1.9)
Lactic Acid, Venous: 1.8 mmol/L (ref 0.5–1.9)

## 2024-05-16 LAB — AMMONIA: Ammonia: <13 umol/L (ref 9–35)

## 2024-05-16 MED ORDER — ALBUTEROL SULFATE (2.5 MG/3ML) 0.083% IN NEBU
10.0000 mg | INHALATION_SOLUTION | Freq: Once | RESPIRATORY_TRACT | Status: AC
  Administered 2024-05-16: 10 mg via RESPIRATORY_TRACT
  Filled 2024-05-16: qty 12

## 2024-05-16 MED ORDER — CLONIDINE HCL 0.2 MG PO TABS
0.3000 mg | ORAL_TABLET | Freq: Once | ORAL | Status: AC
  Administered 2024-05-16: 0.3 mg via ORAL
  Filled 2024-05-16: qty 1

## 2024-05-16 MED ORDER — SODIUM CHLORIDE 0.9 % IV SOLN
2.0000 g | Freq: Once | INTRAVENOUS | Status: AC
  Administered 2024-05-16: 2 g via INTRAVENOUS
  Filled 2024-05-16: qty 12.5

## 2024-05-16 MED ORDER — HYDRALAZINE HCL 20 MG/ML IJ SOLN
5.0000 mg | Freq: Once | INTRAMUSCULAR | Status: AC
  Administered 2024-05-17: 5 mg via INTRAVENOUS
  Filled 2024-05-16: qty 1

## 2024-05-16 MED ORDER — CALCIUM GLUCONATE-NACL 1-0.675 GM/50ML-% IV SOLN
1.0000 g | Freq: Once | INTRAVENOUS | Status: AC
  Administered 2024-05-16: 1000 mg via INTRAVENOUS
  Filled 2024-05-16: qty 50

## 2024-05-16 MED ORDER — SODIUM CHLORIDE 0.9 % IV BOLUS
500.0000 mL | Freq: Once | INTRAVENOUS | Status: AC
  Administered 2024-05-16: 500 mL via INTRAVENOUS

## 2024-05-16 MED ORDER — HYDRALAZINE HCL 25 MG PO TABS
25.0000 mg | ORAL_TABLET | Freq: Once | ORAL | Status: AC
  Administered 2024-05-16: 25 mg via ORAL
  Filled 2024-05-16: qty 1

## 2024-05-16 MED ORDER — LOSARTAN POTASSIUM 50 MG PO TABS
100.0000 mg | ORAL_TABLET | Freq: Once | ORAL | Status: AC
  Administered 2024-05-16: 100 mg via ORAL
  Filled 2024-05-16: qty 2

## 2024-05-16 NOTE — Plan of Care (Incomplete)
 Chief complaint: AMS  73 year old male with history of CAD, persistent A-fib not on anticoagulation, hypertension, HFpEF, NSVT, iron  deficiency anemia, CKD stage IIIa, type 2 diabetes, GERD, gout, hyperlipidemia, obesity, OSA, RLS, cerebellar degeneration, wheelchair dependent, neurogenic bladder status post chronic suprapubic catheter presented to the ED for evaluation of confusion, lethargy, fatigue, excessive sleepiness, decreased appetite, and foul smelling urine from suprapubic catheter.  ED course: Hypertensive with SBP up to 230s.  Afebrile.  Labs notable for WBC count 14.1, hemoglobin 12.2 (stable), sodium 131, potassium 5.6, chloride 95, bicarb 23, glucose 201, BUN 26, creatinine 1.5 (stable), lactic acid 2.9> 3.5> 1.8, lipase normal, LFTs normal, troponin 19> 19, blood cultures in process, ammonia level <13, VBG with pH 7.34 and pCO2 52.0.  UA with negative nitrite, moderate leukocytes, and microscopy showing 6-10 RBCs, 21-50 WBCs, and rare bacteria.  Urine culture in process.  Chest x-ray showing stable cardiomegaly and pulmonary vascular congestion.  CT head showing no acute intracranial abnormality.  Patient was given albuterol , clonidine , hydralazine , calcium  gluconate 1 g, cefepime , and 500 mL normal saline.  Suprapubic catheter exchanged in the ED.  EKG: A-fib with slow ventricular response, baseline wander, T wave abnormalities in inferior leads.  No significant change compared to previous EKG.    CAUTI Neurogenic bladder status post chronic suprapubic catheter Patient presenting for evaluation of foul-smelling urine from suprapubic catheter.  UA with negative nitrite, moderate leukocytes, and microscopy showing 6-10 RBCs, 21-50 WBCs, and rare bacteria.  Has mild leukocytosis but does not meet any other SIRS criteria.  Lactic acidosis likely from dehydration and resolved after 500 mL IV fluids.  Continue cefepime  and follow-up urine and blood cultures.  Trend WBC count.  Acute  metabolic encephalopathy Likely secondary to UTI.  No elevation of ammonia level.  CT head showing no acute intracranial abnormality and no focal neurodeficit on exam.  No fever or meningeal signs.  Generalized weakness PT/OT eval, fall precautions.  Chronic HFmrEF Chest x-ray showing pulmonary vascular congestion.  Patient is not hypoxic.  Last echo done in April 2023 showing EF 55 to 60%, mild aortic stenosis, mild mitral regurgitation.  Check BNP.  Hypertensive urgency SBP as high as 230s in the ED.  Patient was given his home clonidine  and hydralazine .  Mild hyperkalemia No acute EKG changes.  Patient was given calcium  gluconate, albuterol , and IV fluids in the ED.  Repeat labs ordered to check potassium level.  Mild hyponatremia Patient received IV fluids in the ED.  Repeat labs ordered to check sodium level.  CAD Workup not suggestive of ACS.  Persistent A-fib with slow ventricular response Not on anticoagulation.  Hypertension  History of NSVT  Chronic anemia Hemoglobin stable.  CKD stage IIIa Creatinine stable.  Type 2 diabetes Last A1c 7.3 in January 2023, repeat ordered.  Placed on sensitive sliding scale insulin  ACHS.  GERD  Gout  Hyperlipidemia  OSA  RLS

## 2024-05-16 NOTE — ED Provider Notes (Signed)
 [place] EMERGENCY DEPARTMENT AT [facility] Provider Note   CSN: [id number] Arrival date & time: 05/16/24  1338     Patient presents with: Altered Mental Status   Anthony Mueller is a 73 y.o. male here for evaluation of altered mental status.  Wife states patient has had increased confusion over the last few days.  As well as fatigue, decreased appetite and foul urine from his suprapubic catheter.  Wife states urine is typically clear however over the last 24 hours it has become yellow, thick and has malodor to it.  She notes that she had to change his Foley bag out twice in the middle of the night due to increased urination.  He is nonambulatory at baseline.  Unable to turn as well.  Typically is able to help lift his extremities off the bed to help with changes however is not been able to due to that due to extreme weakness.  Wife states his right eye has been drooping over the last few days as well.  Patient denies any headache, nausea, vomiting, abdominal pain.  Family unsure of sacral wounds.  No recent falls or injuries.  Wife states yesterday patient was saying that there were snakes coming out of the ceiling.  Had a recent heart cath on 05/07/2024.  No chest pain, shortness of breath, increase in lower extremity swelling.   HPI     Prior to Admission medications   Medication Sig Start Date End Date Taking? Authorizing Provider  albuterol  (VENTOLIN  HFA) 108 (90 Base) MCG/ACT inhaler Inhale 2 puffs into the lungs every 6 (six) hours as needed for wheezing or shortness of breath. 11/16/21  Yes Rai, Ripudeep K, MD  allopurinol  (ZYLOPRIM ) 100 MG tablet Take 100 mg by mouth 2 (two) times daily.   Yes [provider]  Ascorbic Acid  (VITAMIN C) 1000 MG tablet Take 1,000 mg by mouth daily.   Yes [provider]  aspirin  EC 81 MG tablet Take 81 mg by mouth daily.   Yes [provider]  carvedilol  (COREG ) 12.5 MG tablet Take 1 tablet (12.5 mg total) by  mouth 2 (two) times daily. 03/05/24  Yes Carlin Delon BROCKS, NP  cloNIDine  (CATAPRES ) 0.3 MG tablet Take 0.3 mg by mouth 3 (three) times daily.   Yes [provider]  cyanocobalamin  (VITAMIN B12) 1000 MCG/ML injection Inject 1,000 mcg into the muscle every 30 (thirty) days. 08/09/23  Yes [provider]  ferrous sulfate  ER (IRON  SLOW RELEASE) 142 (45 Fe) MG TBCR tablet Take 45 mg by mouth every evening.   Yes [provider]  furosemide  (LASIX ) 40 MG tablet Take 40 mg by mouth 4 (four) times a week. Take 40mg  by mouth every Monday, Wednesday, Friday and Saturday 03/14/22  Yes [provider]  hydrALAZINE  (APRESOLINE ) 25 MG tablet Take 50 mg by mouth in the morning, at noon, and at bedtime. 03/08/22  Yes [provider]  isosorbide dinitrate (ISORDIL) 10 MG tablet Take 10 mg by mouth 3 (three) times daily. 02/17/22  Yes [provider]  losartan  (COZAAR ) 100 MG tablet Take 100 mg by mouth every evening.   Yes [provider]  lovastatin (MEVACOR) 40 MG tablet Take 40 mg by mouth every evening. 09/20/21  Yes [provider]  Magnesium  Oxide -Mg Supplement 400 MG CAPS Take 800 mg by mouth daily. Patient taking differently: Take 400 mg by mouth 2 (two) times daily. 03/22/24  Yes Walker, Caitlin S, NP  metFORMIN (GLUCOPHAGE) 500  MG tablet Take 1,000 mg by mouth 2 (two) times daily. 08/17/21  Yes [provider]  nitroGLYCERIN  (NITROSTAT ) 0.4 MG SL tablet Place 1 tablet (0.4 mg total) under the tongue every 5 (five) minutes as needed. 04/16/24 07/15/24 Yes Carlin Delon BROCKS, NP  nystatin (MYCOSTATIN/NYSTOP) powder Apply 1 Application topically daily as needed (rash). 05/02/22  Yes [provider]  omeprazole (PRILOSEC) 20 MG capsule Take 20 mg by mouth in the morning and at bedtime. 02/04/22  Yes [provider]  potassium chloride  SA (KLOR-CON  M15) 15 MEQ tablet Take 15 mEq by mouth every Monday, Wednesday, and Friday.  03/15/22  Yes [provider]  spironolactone (ALDACTONE) 25 MG tablet Take 25 mg by mouth 4 (four) times a week. Mon, Wed, Fri, and Sat 02/16/22  Yes [provider]  timolol  (TIMOPTIC ) 0.5 % ophthalmic solution Place 1 drop into both eyes daily. 09/13/21  Yes [provider]  TRADJENTA  5 MG TABS tablet Take 5 mg by mouth daily. 09/20/21  Yes [provider]  Vitamin D, Ergocalciferol, (DRISDOL) 1.25 MG (50000 UNIT) CAPS capsule Take 50,000 Units by mouth every Sunday. 03/19/24  Yes [provider]  zinc  gluconate 50 MG tablet Take 50 mg by mouth daily.   Yes [provider]  oxaprozin (DAYPRO) 600 MG tablet Take 1,200 mg by mouth daily as needed (Gout).    [provider]    Allergies: Victoza [liraglutide], Hydrocodone, Other, Tape, and Metronidazole    Review of Systems  Constitutional:  Positive for activity change, appetite change, chills and fatigue.  HENT: Negative.    Respiratory: Negative.    Cardiovascular: Negative.   Gastrointestinal:  Positive for nausea. Negative for abdominal distention, abdominal pain, constipation, diarrhea and vomiting.  Genitourinary:  Positive for frequency. Negative for decreased urine volume, difficulty urinating, dysuria and urgency.  Musculoskeletal: Negative.   Skin: Negative.   Neurological:  Positive for weakness.  All other systems reviewed and are negative.   Updated Vital Signs BP (!) 221/84   Pulse (!) 52   Temp 98.6 F (37 C) (Oral)   Resp 11   Ht 6' 1 (1.854 m)   Wt 127 kg   SpO2 98%   BMI 36.94 kg/m   Physical Exam Vitals and nursing note reviewed.  Constitutional:      General: He is not in acute distress.    Appearance: He is well-developed. He is ill-appearing. He is not toxic-appearing or diaphoretic.  HENT:     Head: Normocephalic and atraumatic.     Mouth/Throat:     Mouth: Mucous membranes are moist.  Eyes:     Pupils: Pupils are equal, round, and  reactive to light.     Comments: Right eye ptosis. PERRLA. No nystagmus. No lower facial droop, smile symmetric   Neck:     Trachea: Trachea and phonation normal.     Comments: Moves wo difficulty Cardiovascular:     Rate and Rhythm: Normal rate and regular rhythm.     Pulses: Normal pulses.          Radial pulses are 2+ on the right side and 2+ on the left side.       Dorsalis pedis pulses are 2+ on the right side and 2+ on the left side.     Heart sounds: Normal heart sounds.     Comments: Intermittently bradycardic, sinus Pulmonary:     Effort: Pulmonary effort is normal. No respiratory distress.     Breath sounds:  Normal breath sounds and air entry.  Chest:     Comments: Non tender chest wall wo crepitus Abdominal:     General: Bowel sounds are normal. There is no distension.     Palpations: Abdomen is soft.     Tenderness: There is no abdominal tenderness.     Comments: Soft non tender, suprapubic cath with thick yellow sediment  Musculoskeletal:        General: Normal range of motion.     Cervical back: Full passive range of motion without pain, normal range of motion and neck supple.  Skin:    General: Skin is warm and dry.  Neurological:     General: No focal deficit present.     Mental Status: He is alert and oriented to person, place, and time.     (all labs ordered are listed, but only abnormal results are displayed) Labs Reviewed  BASIC METABOLIC PANEL WITH GFR - Abnormal; Notable for the following components:      Result Value   Sodium 131 (*)    Potassium 5.6 (*)    Chloride 95 (*)    Glucose, Bld 201 (*)    BUN 26 (*)    Creatinine, Ser 1.50 (*)    GFR, Estimated 49 (*)    All other components within normal limits  CBC WITH DIFFERENTIAL/PLATELET - Abnormal; Notable for the following components:   WBC 14.1 (*)    RBC 3.87 (*)    Hemoglobin 12.2 (*)    HCT 36.6 (*)    Neutro Abs 10.9 (*)    Monocytes Absolute 1.1 (*)    Abs Immature Granulocytes 0.15  (*)    All other components within normal limits  URINALYSIS, W/ REFLEX TO CULTURE (INFECTION SUSPECTED) - Abnormal; Notable for the following components:   Leukocytes,Ua MODERATE (*)    Bacteria, UA RARE (*)    All other components within normal limits  HEPATIC FUNCTION PANEL - Abnormal; Notable for the following components:   Albumin 3.3 (*)    All other components within normal limits  I-STAT CG4 LACTIC ACID, ED - Abnormal; Notable for the following components:   Lactic Acid, Venous 2.9 (*)    All other components within normal limits  I-STAT CG4 LACTIC ACID, ED - Abnormal; Notable for the following components:   Lactic Acid, Venous 3.5 (*)    All other components within normal limits  I-STAT VENOUS BLOOD GAS, ED - Abnormal; Notable for the following components:   pO2, Ven 29 (*)    Bicarbonate 28.3 (*)    Sodium 133 (*)    HCT 38.0 (*)    Hemoglobin 12.9 (*)    All other components within normal limits  TROPONIN I (HIGH SENSITIVITY) - Abnormal; Notable for the following components:   Troponin I (High Sensitivity) 19 (*)    All other components within normal limits  TROPONIN I (HIGH SENSITIVITY) - Abnormal; Notable for the following components:   Troponin I (High Sensitivity) 19 (*)    All other components within normal limits  URINE CULTURE  CULTURE, BLOOD (ROUTINE X 2)  CULTURE, BLOOD (ROUTINE X 2)  LIPASE, BLOOD  AMMONIA  I-STAT CG4 LACTIC ACID, ED  I-STAT CG4 LACTIC ACID, ED    EKG: EKG Interpretation Date/Time:  Thursday May 16 2024 16:55:28 EDT Ventricular Rate:  54 PR Interval:    QRS Duration:  92 QT Interval:  507 QTC Calculation: 429 R Axis:   20  Text Interpretation: Atrial fibrillation Ventricular premature  complex Nonspecific repol abnormality, diffuse leads Confirmed by Mannie Pac [phone]) on 05/16/2024 9:36:47 PM  Radiology: CT Head Wo Contrast Result Date: 05/16/2024 EXAM: CT HEAD WITHOUT CONTRAST 05/16/2024 07:00:59 PM TECHNIQUE: CT of the  head was performed without the administration of intravenous contrast. Automated exposure control, iterative reconstruction, and/or weight based adjustment of the mA/kV was utilized to reduce the radiation dose to as low as reasonably achievable. COMPARISON: MRI head dated 02/04/2024. CLINICAL HISTORY: Delirium. FINDINGS: BRAIN AND VENTRICLES: No acute hemorrhage. Gray-white differentiation is preserved. No hydrocephalus. No extra-axial collection. No mass effect or midline shift. Chronic microvascular ischemia and generalized atrophy. ORBITS: No acute abnormality. SINUSES: No acute abnormality. SOFT TISSUES AND SKULL: No acute soft tissue abnormality. No skull fracture. IMPRESSION: 1. No acute intracranial abnormality. 2. Chronic microvascular ischemia and generalized atrophy. Electronically signed by: Norman Gatlin MD 05/16/2024 07:36 PM EDT RP Workstation: [id number]   DG Chest Portable 1 View Result Date: 05/16/2024 EXAM: 1 VIEW XRAY OF THE CHEST 05/16/2024 06:05:00 PM COMPARISON: 02/04/2024 CLINICAL HISTORY: Sepsis, AMS. FINDINGS: LUNGS AND PLEURA: No focal pulmonary opacity. No focal consolidation. No pleural effusion. No pneumothorax. Pulmonary vascular congestion. HEART AND MEDIASTINUM: Stable cardiomegaly. BONES AND SOFT TISSUES: No acute osseous abnormality. IMPRESSION: 1. Stable cardiomegaly and pulmonary vascular congestion. 2. No focal consolidation, pleural effusion, or pneumothorax. Electronically signed by: Norman Gatlin MD 05/16/2024 06:29 PM EDT RP Workstation: [id number]     .Critical Care  Performed by: Edie Rosebud LABOR, PA-C Authorized by: Edie Rosebud LABOR, PA-C   Critical care provider statement:    Critical care time (minutes):  35   Critical care was necessary to treat or prevent imminent or life-threatening deterioration of the following conditions:  Sepsis   Critical care was time spent personally by me on the following activities:  Development of treatment plan with  patient or surrogate, discussions with consultants, evaluation of patient's response to treatment, examination of patient, ordering and review of laboratory studies, ordering and review of radiographic studies, ordering and performing treatments and interventions, pulse oximetry, re-evaluation of patient's condition and review of old charts    Medications Ordered in the ED  albuterol  (PROVENTIL ) (2.5 MG/3ML) 0.083% nebulizer solution 10 mg (10 mg Nebulization Given 05/16/24 1729)  sodium chloride  0.9 % bolus 500 mL (0 mLs Intravenous Stopped 05/16/24 1927)  calcium  gluconate 1 g/ 50 mL sodium chloride  IVPB (0 mg Intravenous Stopped 05/16/24 1809)  ceFEPIme  (MAXIPIME ) 2 g in sodium chloride  0.9 % 100 mL IVPB (0 g Intravenous Stopped 05/16/24 1809)  cloNIDine  (CATAPRES ) tablet 0.3 mg (0.3 mg Oral Given 05/16/24 2217)  hydrALAZINE  (APRESOLINE ) tablet 25 mg (25 mg Oral Given 05/16/24 2216)  losartan  (COZAAR ) tablet 100 mg (100 mg Oral Given 05/16/24 3452)    73 year old here for evaluation of confusion, increased weakness and malodorous changes in urine.  He is nonambulatory at baseline however is typically able to move his legs.  Over last few days he has had worsening confusion.  Yesterday wife said he was hallucinating and seeing snakes from the ceiling.  She has noted his suprapubic catheter has had dark, thick sediment which is malodorous.  No fever at home.  Here he is afebrile, nonseptic however does appear chronically ill.  Mild headache yesterday, none currently.  Blood pressures have been all over the place.  He has no new numbness or weakness to extremities.  No vision changes however does note a right eyelid droop x 2- 3 days.  No redness, warmth.  He has a  symmetric smile.  No numbness or weakness.  No slurred speech.  Will plan on labs, imaging.   Labs and imaging personally viewed and interpreted:  Urine tubing switched out,, moderate leuks, 20-50 WBC, rare bacteria Lactic 1.8 Troponin  19 CBC leukocytosis 14.1, hemoglobin 12.2 Metabolic panel sodium 131, potassium 5.6, creatinine 1.5--has had some bradycardic episodes, calcium , IV fluids and DuoNeb given for hyperkalemia VBG without acidosis Ammonia within normal limit Imaging without significant abnormality EKG without ischemic changes Lactic 2.9-3.5-1.8 after IV fluids  Discussed results with patient, family in room.  Mentation has improved however still feels weak.  Will admit for possible sepsis.  Suspect urinary source.  No recent urine cultures.  Family updated.  Discussed with medicine team.  Recommend improve blood pressure control and reconsulting for admission after that.  Patient has not taken his BP meds for his afternoon nighttime doses.  Will give his clonidine , hydralazine , Cozaar  dose and reassess.   Clinical Course as of 05/16/24 2328  Thu May 16, 2024  2218 Discussed with Dr. Alfornia.  She is recommending better blood pressure control and reconsulting for admission once blood pressure improved. [BH]  [address] wants systolic below 200. AMS due to UTI. HyperK on arrival, albuterol  neb calcium  and fluids. Attempted admission however due to BP was refused. No CP, SOB, HA, Blurred vision.  [CG]    Clinical Course User Index [BH] Gargi Berch A, PA-C [CG] Ruthell Lonni FALCON, PA-C                                 Medical Decision Making Amount and/or Complexity of Data Reviewed Independent Historian: spouse External Data Reviewed: labs, radiology, ECG and notes. Labs: ordered. Decision-making details documented in ED Course. Radiology: ordered and independent interpretation performed. Decision-making details documented in ED Course. ECG/medicine tests: ordered and independent interpretation performed. Decision-making details documented in ED Course.  Risk OTC drugs. Prescription drug management. Parenteral controlled substances. Decision regarding hospitalization. Diagnosis or treatment  significantly limited by social determinants of health.      Final diagnoses:  Sepsis without acute organ dysfunction, due to unspecified organism (HCC)  Hyperkalemia  Altered mental status, unspecified altered mental status type  Acute cystitis without hematuria  Suprapubic catheter (HCC)  Elevated lactic acid level  Hypertension, unspecified type    ED Discharge Orders     None          Kaden Dunkel A, PA-C 05/16/24 2328    Mannie Pac T, DO 05/17/24 2321

## 2024-05-16 NOTE — ED Notes (Signed)
 Facilities contacted to increase the heat in the room.  The patient's family states that they are freezing.

## 2024-05-16 NOTE — Telephone Encounter (Signed)
 Copied from CRM #[id number]. Topic: Information Request - General Information Request/Update >> May 16, 2024 12:00 PM Frederik HERO wrote: Wife/barbara is calling other request    Include all details related to the request(s) below:  FYI  Wife states patient got a stent placed last Tuesday. States since this Tuesday patient has been very lethargic, constantly sleeping , states patient has a really bad urine odor and states patient feels clamy. Wife also states patient 's eye is drooping . Wife states she will be taking to the [facility] right now.     Confirm and type the Best Contact Number below:  Patient/caller contact number:        [phone]     [] Home  [x] Mobile  [] Work [] Other   [x] Okay to leave a voicemail   Medication List:  Current Outpatient Medications:  .  albuterol  HFA (PROVENTIL  HFA;VENTOLIN  HFA;PROAIR  HFA) 90 mcg/actuation inhaler, Inhale 2 puffs as needed., Disp: , Rfl:  .  allopurinoL  (ZYLOPRIM ) 100 mg tablet, TAKE 1 TABLET BY MOUTH DAILY IN THE MORNING FOR GOUT PREVENTION, Disp: 90 tablet, Rfl: 3 .  ascorbic acid  (VITAMIN C) 1,000 mg tablet, Take 1,000 mg by mouth daily., Disp: , Rfl:  .  aspirin  81 mg EC tablet, Take 81 mg by mouth daily., Disp: , Rfl:  .  blood-glucose meter misc, 1 each by miscellaneous route Once Daily., Disp: 1 each, Rfl: 2 .  carvediloL  (COREG ) 25 mg tablet, TAKE 1 TABLET (25 MG TOTAL) BY MOUTH 2 TIMES DAILY WITH MEALS., Disp: 180 tablet, Rfl: 3 .  cloNIDine  (CATAPRES ) 0.3 mg tablet, Take 1 tablet (0.3 mg total) by mouth 3 (three) times a day., Disp: 270 tablet, Rfl: 3 .  ergocalciferol (VITAMIN D2) 1,250 mcg (50,000 unit) capsule, TAKE 1 CAPSULE BY MOUTH ONE TIME PER WEEK, Disp: 12 capsule, Rfl: 3 .  ferrous sulfate  (Slow Release Iron ) 142 mg (45 mg iron ) TbER ER tablet, Take 1 tablet by mouth every evening., Disp: , Rfl:  .  finasteride (PROSCAR) 5 mg tablet, Take 5 mg by mouth Once Daily., Disp: , Rfl:  .  furosemide  (LASIX ) 40 mg  tablet, 1 PILL EACH M-W-F MORNING, Disp: 50 tablet, Rfl: 5 .  glucose blood (Accu-Chek Guide test strips) test strip, 1 each by Other route daily., Disp: 100 strip, Rfl: 3 .  hydrALAZINE  (APRESOLINE ) 25 mg tablet, TAKE 2 TABLETS (50 MG TOTAL) BY MOUTH 3 TIMES DAILY., Disp: 540 tablet, Rfl: 3 .  isosorbide dinitrate (ISORDIL) 10 mg tablet, TAKE 1 TABLET BY MOUTH THREE TIMES A DAY, Disp: 270 tablet, Rfl: 3 .  Klor-Con  M15 15 mEq ER tablet, TAKE 1 TABLET (15 MEQ TOTAL) BY MOUTH DAILY., Disp: 90 tablet, Rfl: 2 .  Lancets (Accu-Chek Softclix Lancets) misc, 1 each by Other route daily., Disp: 100 each, Rfl: 3 .  lancing device with lancets kit, 100 each by miscellaneous route Once Daily., Disp: 100 each, Rfl: 11 .  losartan  (COZAAR ) 100 mg tablet, TAKE 1 TABLET BY MOUTH EVERY DAY, Disp: 90 tablet, Rfl: 0 .  lovastatin (MEVACOR) 40 mg tablet, TAKE ONE TABLET BY MOUTH IN THE EVENING FOR CHOLESTEROL, Disp: 90 tablet, Rfl: 3 .  MAGNESIUM  GLYCINATE ORAL, Take 665 mg by mouth 2 (two) times a day., Disp: , Rfl:  .  metFORMIN (GLUCOPHAGE) 500 mg tablet, TAKE 2 TABLETS (1,000 MG TOTAL) BY MOUTH 2 TIMES DAILY WITH MEALS., Disp: 360 tablet, Rfl: 3 .  MISCELLANEOUS MEDICATION/PRODUCT, Bedside commode., Disp: 1 each, Rfl: 1 .  nystatin (MYCOSTATIN) 100,000 unit/gram powder, Apply to rash daily, Disp: 60 g, Rfl: 11 .  omeprazole (PriLOSEC) 20 mg DR capsule, TAKE 2 CAPSULES BY MOUTH EVERY DAY, Disp: 180 capsule, Rfl: 3 .  oxaprozin (DAYPRO) 600 mg tablet, Take 1 tablet (600 mg total) by mouth daily as needed (Gout attack)., Disp: 30 tablet, Rfl: 5 .  psyllium (Metamucil Fiber Singles) 3.4 gram packet, Take 1 packet by mouth at bedtime., Disp: , Rfl:  .  spironolactone (ALDACTONE) 25 mg tablet, TAKE 1 TABLET (25 MG TOTAL) BY MOUTH EVERY MONDAY, WEDNESDAY AND FRIDAY., Disp: 60 tablet, Rfl: 1 .  timolol  (TIMOPTIC ) 0.5 % ophthalmic solution, Administer 1 drop into each eyes Once Daily., Disp: , Rfl: 6 .  Tradjenta  5 mg  tab, TAKE 1 TABLET BY MOUTH DAILY FOR DIABETES, Disp: 90 tablet, Rfl: 3 .  zinc  oxide 20 % ointment, Apply 1 Application topically as needed., Disp: , Rfl:  .  zinc  sulfate 220 mg tablet, Take 50 mg by mouth daily., Disp: , Rfl:      Medication Request/Refills: Pharmacy Information (if applicable)   [] Not Applicable       []  Pharmacy listed  Send Medication Request to:                                                 [] Pharmacy not listed (added to pharmacy list in Epic) Send Medication Request to:      Listed Pharmacies: [id number] - [place], [place] - [address] - PHONE: [phone] - FAX: [phone]

## 2024-05-16 NOTE — ED Provider Triage Note (Signed)
 Emergency Medicine Provider Triage Evaluation Note  Anthony Mueller , a 73 y.o. male  was evaluated in triage.  Pt complains of confusion. Increase confusion, fatigue, decreaed appetite, foul urine odor from suprapubic cath and endorse weakness and excessive sleepiness.    Review of Systems  Positive: As above Negative: As above  Physical Exam  BP (!) 172/92 (BP Location: Left Arm)   Pulse (!) 50   Temp 98.4 F (36.9 C)   Resp 18   Ht 6' 1 (1.854 m)   Wt 127 kg   SpO2 91%   BMI 36.94 kg/m  Gen:   Awake, no distress   Resp:  Normal effort  MSK:   Moves extremities without difficulty  Other:    Medical Decision Making  Medically screening exam initiated at 2:47 PM.  Appropriate orders placed.  Anthony Mueller [facility] was informed that the remainder of the evaluation will be completed by another provider, this initial triage assessment does not replace that evaluation, and the importance of remaining in the ED until their evaluation is complete.  ?UTI   Anthony Colon, PA-C 05/16/24 1448

## 2024-05-16 NOTE — ED Notes (Signed)
Lac= 1.8

## 2024-05-16 NOTE — Sepsis Progress Note (Signed)
 eLink is following this Code Sepsis.

## 2024-05-16 NOTE — ED Notes (Signed)
 CCMD contacted to place the patient on cardiac monitoring services.

## 2024-05-16 NOTE — ED Triage Notes (Signed)
 Family reports having weakness and confusion.  REports hypotension and foul smelling urine.  Reports started Monday.  Unable to stand now.  Reports usually able to lift his legs and help but can't now and also reports sleeping most the day.

## 2024-05-17 DIAGNOSIS — E782 Mixed hyperlipidemia: Secondary | ICD-10-CM | POA: Diagnosis present

## 2024-05-17 DIAGNOSIS — I5022 Chronic systolic (congestive) heart failure: Secondary | ICD-10-CM | POA: Diagnosis present

## 2024-05-17 DIAGNOSIS — R4182 Altered mental status, unspecified: Secondary | ICD-10-CM | POA: Diagnosis not present

## 2024-05-17 DIAGNOSIS — E66812 Obesity, class 2: Secondary | ICD-10-CM | POA: Diagnosis present

## 2024-05-17 DIAGNOSIS — I130 Hypertensive heart and chronic kidney disease with heart failure and stage 1 through stage 4 chronic kidney disease, or unspecified chronic kidney disease: Secondary | ICD-10-CM | POA: Diagnosis present

## 2024-05-17 DIAGNOSIS — T83511A Infection and inflammatory reaction due to indwelling urethral catheter, initial encounter: Secondary | ICD-10-CM | POA: Diagnosis not present

## 2024-05-17 DIAGNOSIS — Z8616 Personal history of COVID-19: Secondary | ICD-10-CM | POA: Diagnosis not present

## 2024-05-17 DIAGNOSIS — I2510 Atherosclerotic heart disease of native coronary artery without angina pectoris: Secondary | ICD-10-CM | POA: Diagnosis present

## 2024-05-17 DIAGNOSIS — I160 Hypertensive urgency: Secondary | ICD-10-CM | POA: Diagnosis present

## 2024-05-17 DIAGNOSIS — N3000 Acute cystitis without hematuria: Secondary | ICD-10-CM | POA: Diagnosis present

## 2024-05-17 DIAGNOSIS — A419 Sepsis, unspecified organism: Secondary | ICD-10-CM | POA: Diagnosis not present

## 2024-05-17 DIAGNOSIS — N390 Urinary tract infection, site not specified: Secondary | ICD-10-CM | POA: Diagnosis not present

## 2024-05-17 DIAGNOSIS — Z66 Do not resuscitate: Secondary | ICD-10-CM | POA: Diagnosis present

## 2024-05-17 DIAGNOSIS — Y846 Urinary catheterization as the cause of abnormal reaction of the patient, or of later complication, without mention of misadventure at the time of the procedure: Secondary | ICD-10-CM | POA: Diagnosis present

## 2024-05-17 DIAGNOSIS — E875 Hyperkalemia: Secondary | ICD-10-CM | POA: Diagnosis present

## 2024-05-17 DIAGNOSIS — I4819 Other persistent atrial fibrillation: Secondary | ICD-10-CM | POA: Diagnosis present

## 2024-05-17 DIAGNOSIS — E860 Dehydration: Secondary | ICD-10-CM | POA: Diagnosis present

## 2024-05-17 DIAGNOSIS — E872 Acidosis, unspecified: Secondary | ICD-10-CM | POA: Diagnosis present

## 2024-05-17 DIAGNOSIS — E119 Type 2 diabetes mellitus without complications: Secondary | ICD-10-CM | POA: Diagnosis not present

## 2024-05-17 DIAGNOSIS — T83518A Infection and inflammatory reaction due to other urinary catheter, initial encounter: Secondary | ICD-10-CM | POA: Diagnosis present

## 2024-05-17 DIAGNOSIS — D631 Anemia in chronic kidney disease: Secondary | ICD-10-CM | POA: Diagnosis present

## 2024-05-17 DIAGNOSIS — I080 Rheumatic disorders of both mitral and aortic valves: Secondary | ICD-10-CM | POA: Diagnosis present

## 2024-05-17 DIAGNOSIS — Z7901 Long term (current) use of anticoagulants: Secondary | ICD-10-CM | POA: Diagnosis not present

## 2024-05-17 DIAGNOSIS — M109 Gout, unspecified: Secondary | ICD-10-CM | POA: Diagnosis present

## 2024-05-17 DIAGNOSIS — G2581 Restless legs syndrome: Secondary | ICD-10-CM | POA: Diagnosis present

## 2024-05-17 DIAGNOSIS — G9341 Metabolic encephalopathy: Secondary | ICD-10-CM | POA: Diagnosis present

## 2024-05-17 DIAGNOSIS — E871 Hypo-osmolality and hyponatremia: Secondary | ICD-10-CM | POA: Diagnosis present

## 2024-05-17 DIAGNOSIS — N1831 Chronic kidney disease, stage 3a: Secondary | ICD-10-CM | POA: Diagnosis present

## 2024-05-17 DIAGNOSIS — K219 Gastro-esophageal reflux disease without esophagitis: Secondary | ICD-10-CM | POA: Diagnosis present

## 2024-05-17 DIAGNOSIS — E1122 Type 2 diabetes mellitus with diabetic chronic kidney disease: Secondary | ICD-10-CM | POA: Diagnosis present

## 2024-05-17 HISTORY — DX: Urinary tract infection, site not specified: N39.0

## 2024-05-17 LAB — CBC
HCT: 35.2 % — ABNORMAL LOW (ref 39.0–52.0)
Hemoglobin: 11.8 g/dL — ABNORMAL LOW (ref 13.0–17.0)
MCH: 31.5 pg (ref 26.0–34.0)
MCHC: 33.5 g/dL (ref 30.0–36.0)
MCV: 93.9 fL (ref 80.0–100.0)
Platelets: 196 K/uL (ref 150–400)
RBC: 3.75 MIL/uL — ABNORMAL LOW (ref 4.22–5.81)
RDW: 14.5 % (ref 11.5–15.5)
WBC: 11.7 K/uL — ABNORMAL HIGH (ref 4.0–10.5)
nRBC: 0 % (ref 0.0–0.2)

## 2024-05-17 LAB — GLUCOSE, CAPILLARY
Glucose-Capillary: 149 mg/dL — ABNORMAL HIGH (ref 70–99)
Glucose-Capillary: 222 mg/dL — ABNORMAL HIGH (ref 70–99)
Glucose-Capillary: 176 mg/dL — ABNORMAL HIGH (ref 70–99)

## 2024-05-17 LAB — BASIC METABOLIC PANEL WITH GFR
Anion gap: 11 (ref 5–15)
BUN: 22 mg/dL (ref 8–23)
CO2: 25 mmol/L (ref 22–32)
Calcium: 9.3 mg/dL (ref 8.9–10.3)
Chloride: 99 mmol/L (ref 98–111)
Creatinine, Ser: 1.33 mg/dL — ABNORMAL HIGH (ref 0.61–1.24)
GFR, Estimated: 56 mL/min — ABNORMAL LOW (ref >60)
Glucose, Bld: 191 mg/dL — ABNORMAL HIGH (ref 70–99)
Potassium: 4.5 mmol/L (ref 3.5–5.1)
Sodium: 135 mmol/L (ref 135–145)

## 2024-05-17 LAB — CBG MONITORING, ED
Glucose-Capillary: 195 mg/dL — ABNORMAL HIGH (ref 70–99)
Glucose-Capillary: 170 mg/dL — ABNORMAL HIGH (ref 70–99)

## 2024-05-17 LAB — HEMOGLOBIN A1C
Hgb A1c MFr Bld: 8.2 % — ABNORMAL HIGH (ref 4.8–5.6)
Mean Plasma Glucose: 188.64 mg/dL

## 2024-05-17 LAB — URINE CULTURE

## 2024-05-17 LAB — BRAIN NATRIURETIC PEPTIDE: B Natriuretic Peptide: 295.5 pg/mL — ABNORMAL HIGH (ref 0.0–100.0)

## 2024-05-17 MED ORDER — HYDRALAZINE HCL 50 MG PO TABS
50.0000 mg | ORAL_TABLET | Freq: Three times a day (TID) | ORAL | Status: DC
  Administered 2024-05-17 – 2024-05-18 (×3): 50 mg via ORAL
  Filled 2024-05-17: qty 1
  Filled 2024-05-17: qty 2
  Filled 2024-05-17: qty 1

## 2024-05-17 MED ORDER — ISOSORBIDE DINITRATE 10 MG PO TABS
10.0000 mg | ORAL_TABLET | Freq: Three times a day (TID) | ORAL | Status: DC
  Administered 2024-05-17 – 2024-05-18 (×4): 10 mg via ORAL
  Filled 2024-05-17 (×4): qty 1

## 2024-05-17 MED ORDER — ENOXAPARIN SODIUM 60 MG/0.6ML IJ SOSY
60.0000 mg | PREFILLED_SYRINGE | INTRAMUSCULAR | Status: DC
  Administered 2024-05-17: 60 mg via SUBCUTANEOUS
  Filled 2024-05-17: qty 0.6

## 2024-05-17 MED ORDER — HYDRALAZINE HCL 20 MG/ML IJ SOLN
5.0000 mg | Freq: Four times a day (QID) | INTRAMUSCULAR | Status: DC | PRN
  Administered 2024-05-17: 5 mg via INTRAVENOUS
  Filled 2024-05-17 (×3): qty 1

## 2024-05-17 MED ORDER — LOSARTAN POTASSIUM 50 MG PO TABS: 100.0000 mg | ORAL_TABLET | Freq: Every evening | ORAL | Status: DC

## 2024-05-17 MED ORDER — ALLOPURINOL 100 MG PO TABS
100.0000 mg | ORAL_TABLET | Freq: Two times a day (BID) | ORAL | Status: DC
  Administered 2024-05-17 – 2024-05-18 (×3): 100 mg via ORAL
  Filled 2024-05-17 (×3): qty 1

## 2024-05-17 MED ORDER — FUROSEMIDE 40 MG PO TABS
40.0000 mg | ORAL_TABLET | Freq: Every day | ORAL | Status: DC
  Administered 2024-05-17 – 2024-05-18 (×2): 40 mg via ORAL
  Filled 2024-05-17 (×2): qty 1

## 2024-05-17 MED ORDER — CLONIDINE HCL 0.2 MG PO TABS
0.3000 mg | ORAL_TABLET | Freq: Three times a day (TID) | ORAL | Status: DC
  Administered 2024-05-17 – 2024-05-18 (×4): 0.3 mg via ORAL
  Filled 2024-05-17 (×4): qty 1

## 2024-05-17 MED ORDER — PRAVASTATIN SODIUM 40 MG PO TABS
40.0000 mg | ORAL_TABLET | Freq: Every day | ORAL | Status: DC
  Administered 2024-05-17: 40 mg via ORAL
  Filled 2024-05-17: qty 1

## 2024-05-17 MED ORDER — TIMOLOL MALEATE 0.5 % OP SOLN
1.0000 [drp] | Freq: Every day | OPHTHALMIC | Status: DC
  Administered 2024-05-17 – 2024-05-18 (×2): 1 [drp] via OPHTHALMIC
  Filled 2024-05-17: qty 5

## 2024-05-17 MED ORDER — INSULIN ASPART 100 UNIT/ML IJ SOLN
0.0000 [IU] | Freq: Three times a day (TID) | INTRAMUSCULAR | Status: DC
  Administered 2024-05-17 – 2024-05-18 (×3): 2 [IU] via SUBCUTANEOUS
  Administered 2024-05-18: 5 [IU] via SUBCUTANEOUS

## 2024-05-17 MED ORDER — ACETAMINOPHEN 650 MG RE SUPP: 650.0000 mg | Freq: Four times a day (QID) | RECTAL | Status: DC | PRN

## 2024-05-17 MED ORDER — INSULIN ASPART 100 UNIT/ML IJ SOLN: 0.0000 [IU] | Freq: Every day | INTRAMUSCULAR | Status: DC

## 2024-05-17 MED ORDER — ASPIRIN 81 MG PO TBEC
81.0000 mg | DELAYED_RELEASE_TABLET | Freq: Every day | ORAL | Status: DC
  Administered 2024-05-17 – 2024-05-18 (×2): 81 mg via ORAL
  Filled 2024-05-17 (×2): qty 1

## 2024-05-17 MED ORDER — ACETAMINOPHEN 325 MG PO TABS: 650.0000 mg | ORAL_TABLET | Freq: Four times a day (QID) | ORAL | Status: DC | PRN

## 2024-05-17 MED ORDER — SODIUM CHLORIDE 0.9 % IV SOLN
2.0000 g | Freq: Two times a day (BID) | INTRAVENOUS | Status: DC
  Administered 2024-05-17 – 2024-05-18 (×3): 2 g via INTRAVENOUS
  Filled 2024-05-17 (×3): qty 12.5

## 2024-05-17 MED ORDER — PANTOPRAZOLE SODIUM 40 MG PO TBEC
40.0000 mg | DELAYED_RELEASE_TABLET | Freq: Every day | ORAL | Status: DC
  Administered 2024-05-17 – 2024-05-18 (×2): 40 mg via ORAL
  Filled 2024-05-17 (×2): qty 1

## 2024-05-17 NOTE — Evaluation (Signed)
 Occupational Therapy Evaluation Patient Details Name: Anthony Mueller MRN: [id number] DOB: Nov 29, 1950 Today's Date: 05/17/2024   History of Present Illness   Pt is 73 yo presenting to [facility] on 7/17 due to confusion, lethargy, fatigue, excessive sleepiness, decreased appetite and foul smelling urine from suprapubic catheter.  PMH: T2DM, HLD, HTN cerebellar degeneration, persistent AF, IDA     Clinical Impressions Anthony Mueller was evaluated s/p the above admission list. He is dependent for tranfers and most ADLs at baseline. Upon evaluation the pt was limited by baseline weakness, unsteady sitting balance and decreased activity tolerance. Overall he needed max-total A +2 for bed mobility and was able to progress to CGA for sitting balance. VSS on RA. Due to the deficits listed below the pt also needs up to total A for LB ADLs and mod A for UB ADLs at bed level. Pt will benefit from continued acute OT services and HHOT.      If plan is discharge home, recommend the following:   A lot of help with walking and/or transfers;A lot of help with bathing/dressing/bathroom;Two people to help with bathing/dressing/bathroom;Assistance with cooking/housework;Assistance with feeding;Assist for transportation;Help with stairs or ramp for entrance     Functional Status Assessment   Patient has had a recent decline in their functional status and demonstrates the ability to make significant improvements in function in a reasonable and predictable amount of time.     Equipment Recommendations   None recommended by OT      Precautions/Restrictions   Precautions Precautions: Fall Recall of Precautions/Restrictions: Intact Restrictions Weight Bearing Restrictions Per Provider Order: No     Mobility Bed Mobility Overal bed mobility: Needs Assistance Bed Mobility: Rolling, Sidelying to Sit, Sit to Supine Rolling: Max assist, +2 for physical assistance, +2 for safety/equipment Sidelying to sit:  Total assist, +2 for physical assistance, +2 for safety/equipment   Sit to supine: +2 for physical assistance, +2 for safety/equipment, Max assist        Transfers              Balance Overall balance assessment: Needs assistance Sitting-balance support: Feet supported Sitting balance-Leahy Scale: Poor Sitting balance - Comments: max A for sitting balance intially, progressed to CGA           ADL either performed or assessed with clinical judgement   ADL Overall ADL's : Needs assistance/impaired;At baseline Eating/Feeding: Set up   Grooming: Set up;Sitting   Upper Body Bathing: Moderate assistance   Lower Body Bathing: Total assistance   Upper Body Dressing : Moderate assistance   Lower Body Dressing: Total assistance   Toilet Transfer: Total assistance   Toileting- Clothing Manipulation and Hygiene: Total assistance       Functional mobility during ADLs: Total assistance;+2 for safety/equipment;+2 for physical assistance General ADL Comments: likely near his functional baseline     Vision Baseline Vision/History: 0 No visual deficits Vision Assessment?: No apparent visual deficits     Perception Perception: Not tested       Praxis Praxis: Not tested       Pertinent Vitals/Pain Pain Assessment Pain Assessment: No/denies pain     Extremity/Trunk Assessment Upper Extremity Assessment Upper Extremity Assessment: Generalized weakness (full ROM, BUE strength is [facility].)   Lower Extremity Assessment Lower Extremity Assessment: Defer to PT evaluation   Cervical / Trunk Assessment Cervical / Trunk Assessment: Kyphotic   Communication Communication Communication: Impaired Factors Affecting Communication: Reduced clarity of speech   Cognition Arousal: Alert Behavior During Therapy: [employer] [facility] for tasks  assessed/performed Cognition: No family/caregiver present to determine baseline         Following commands: Intact       Cueing  General  Comments   Cueing Techniques: Verbal cues  VSS on RA    Home Living Family/patient expects to be discharged to:: Private residence Living Arrangements: Spouse/significant other Available Help at Discharge: Family;Personal care attendant;Available 24 hours/day Type of Home: House Home Access: Ramped entrance           Bathroom Shower/Tub: Walk-in shower         Home Equipment: Wheelchair - Event organiser (2 wheels);Cane - single point          Prior Functioning/Environment Prior Level of Function : Needs assist             Mobility Comments: dependent hoyer trasnfers, has been working on standing for 5-10 seconds at a time. Active with HH. Was using stedy for some transfers prior to recent heart surgery? ADLs Comments: uses etac chair for showers and toileting, dependent hoyer transfers, set up A for grooming and eating. Wife does IADLs, gets dressed at bed level with assist. Active with HHOT    OT Problem List: Decreased strength;Decreased range of motion;Decreased activity tolerance;Impaired balance (sitting and/or standing)   OT Treatment/Interventions: Self-care/ADL training;Therapeutic exercise;DME and/or AE instruction;Therapeutic activities;Patient/family education;Balance training      OT Goals(Current goals can be found in the care plan section)   Acute Rehab OT Goals Patient Stated Goal: home OT Goal Formulation: With patient Time For Goal Achievement: 05/31/24 Potential to Achieve Goals: Good ADL Goals Additional ADL Goal #1: pt will complete bed mobility with mod A as a precursor to ADLs   OT Frequency:  Min 1X/week    Co-evaluation PT/OT/SLP Co-Evaluation/Treatment: Yes Reason for Co-Treatment: Complexity of the patient's impairments (multi-system involvement);For patient/therapist safety;To address functional/ADL transfers   OT goals addressed during session: ADL's and self-care;Strengthening/ROM      AM-PAC OT 6 Clicks  Daily Activity     Outcome Measure Help from another person eating meals?: A Little Help from another person taking care of personal grooming?: A Little Help from another person toileting, which includes using toliet, bedpan, or urinal?: Total Help from another person bathing (including washing, rinsing, drying)?: A Lot Help from another person to put on and taking off regular upper body clothing?: A Lot Help from another person to put on and taking off regular lower body clothing?: Total 6 Click Score: 12   End of Session Nurse Communication: Mobility status  Activity Tolerance: Patient tolerated treatment well Patient left: in bed;with call bell/phone within reach;with bed alarm set  OT Visit Diagnosis: Unsteadiness on feet (R26.81);Other abnormalities of gait and mobility (R26.89);Muscle weakness (generalized) (M62.81)                Time: 8982-8962 OT Time Calculation (min): 20 min Charges:  OT General Charges $OT Visit: 1 Visit OT Evaluation $OT Eval Moderate Complexity: 1 Mod  Lucie Kendall, OTR/L Acute Rehabilitation Services Office [phone] Secure Chat Communication Preferred   Lucie JONETTA Kendall 05/17/2024, 12:29 PM

## 2024-05-17 NOTE — Progress Notes (Signed)
 PROGRESS NOTE        PATIENT DETAILS Name: Anthony Mueller Age: 74 y.o. Sex: male Date of Birth: May 10, 1951 Admit Date: 05/16/2024 Admitting Physician Editha Ram, MD ERE:Tybuz, Debby HERO, MD  Brief Summary: Patient is a 73 y.o.  male with history of A-fib, CAD, HFpEF, CKD stage IIIa, DM-2, OSA-, neurogenic bladder with chronic indwelling suprapubic catheter, stenosis wheelchair dependent-presented with altered mental status-smelling urine/purulent urine-thought to have acute metabolic encephalopathy secondary to his complicated UTI.  Significant events: 7/17>> admit to [facility].  Significant studies: 7/17>> CXR: No PNA 7/17>> CT head: No acute intracranial abnormality.  Significant microbiology data: 7/17>> blood culture: No growth 7/17>> urine culture: Pending  Procedures: None  Consults: None  Subjective: Lying comfortably in bed-denies any chest pain or shortness of breath.  Objective: Vitals: Blood pressure (!) 146/73, pulse (!) 53, temperature (!) 97.4 F (36.3 C), temperature source Oral, resp. rate 17, height 6' 1 (1.854 m), weight 127 kg, SpO2 96%.   Exam: Gen Exam:Alert awake-not in any distress HEENT:atraumatic, normocephalic Chest: B/L clear to auscultation anteriorly CVS:S1S2 regular Abdomen:soft non tender, non distended Extremities:no edema Neurology: Non focal Skin: no rash  Pertinent Labs/Radiology:    Latest Ref Rng & Units 05/17/2024    4:45 AM 05/16/2024    5:02 PM 05/16/2024    2:52 PM  CBC  WBC 4.0 - 10.5 K/uL 11.7   14.1   Hemoglobin 13.0 - 17.0 g/dL 88.1  87.0  87.7   Hematocrit 39.0 - 52.0 % 35.2  38.0  36.6   Platelets 150 - 400 K/uL 196   222     Lab Results  Component Value Date   NA 135 05/17/2024   K 4.5 05/17/2024   CL 99 05/17/2024   CO2 25 05/17/2024      Assessment/Plan: CAUTI Afebrile Leukocytosis decreasing Continue antibiotics and await culture data  Acute metabolic  encephalopathy Likely secondary to above Improving-minimal confusion persists-not yet back to baseline. Supportive care/delirium precautions.  History of neurogenic bladder-s/p suprapubic catheter placement Supportive care   Persistent A-fib with slow ventricular response Telemetry monitoring Beta-blocker on hold due to concern for bradycardia while in the ED Blood pressure notes-not on anticoagulation due to history of GI bleed. Check TSH with a.m. lab   Chronic HFmrEF (EF 55-60% on echo April 2023) Lying flat-mild pedal edema Will hold Aldactone due to mild hyperkalemia and start Lasix  instead.   Hypertensive urgency Significantly elevated BP in the ED Now improving Continue clonidine , hydralazine , Imdur Losartan /Aldactone held due to hyperkalemia. Coreg  held due to bradycardia.   Mild hyperkalemia Resolved    Mild hyponatremia Resolved Follow electrolytes periodically.   Generalized weakness Due to UTI PT/OT eval   CAD No anginal symptoms. S/p recent LHC 7/8-medical management recommended. Continue aspirin /Imdur/statin Beta-blocker on hold due to bradycardia.    History of NSVT Telemetry monitoring Coreg  held due to bradycardia.    Normocytic anemia Secondary to CKD Hb stable Follow periodically.   CKD stage IIIa Creatinine close to baseline.   Type 2 diabetes (A1c 8.2 on 7/18) CBG stable on SSI  Recent Labs    05/17/24 0251 05/17/24 0754  GLUCAP 195* 170*      GERD PPI.   Gout Allopurinol .  Class 2 Obesit: Estimated body mass index is 36.94 kg/m as calculated from the following:   Height as of this  encounter: 6' 1 (1.854 m).   Weight as of this encounter: 127 kg.   Code status:   Code Status: Do not attempt resuscitation (DNR) PRE-ARREST INTERVENTIONS DESIRED   DVT Prophylaxis:SQ Lovenox    Family Communication: None at bedside   Disposition Plan: Status is: Observation The patient will require care spanning > 2 midnights and  should be moved to inpatient because: Severity of illness   Planned Discharge Destination:Home health   Diet: Diet Order             Diet heart healthy/carb modified Room service appropriate? Yes; Fluid consistency: Thin  Diet effective now                     Antimicrobial agents: Anti-infectives (From admission, onward)    Start     Dose/Rate Route Frequency Ordered Stop   05/17/24 0500  ceFEPIme  (MAXIPIME ) 2 g in sodium chloride  0.9 % 100 mL IVPB        2 g 200 mL/hr over 30 Minutes Intravenous Every 12 hours 05/17/24 0205     05/16/24 1730  ceFEPIme  (MAXIPIME ) 2 g in sodium chloride  0.9 % 100 mL IVPB        2 g 200 mL/hr over 30 Minutes Intravenous  Once 05/16/24 1717 05/16/24 1809        MEDICATIONS: Scheduled Meds:  allopurinol   100 mg Oral BID   aspirin  EC  81 mg Oral Daily   cloNIDine   0.3 mg Oral TID   enoxaparin  (LOVENOX ) injection  60 mg Subcutaneous Q24H   hydrALAZINE   50 mg Oral Q8H   insulin  aspart  0-5 Units Subcutaneous QHS   insulin  aspart  0-9 Units Subcutaneous TID WC   isosorbide dinitrate  10 mg Oral TID   pantoprazole   40 mg Oral Daily   pravastatin   40 mg Oral q1800   timolol   1 drop Both Eyes Daily   Continuous Infusions:  ceFEPime  (MAXIPIME ) IV Stopped (05/17/24 0519)   PRN Meds:.acetaminophen  **OR** acetaminophen , hydrALAZINE    I have personally reviewed following labs and imaging studies  LABORATORY DATA: CBC: Recent Labs  Lab 05/16/24 1452 05/16/24 1702 05/17/24 0445  WBC 14.1*  --  11.7*  NEUTROABS 10.9*  --   --   HGB 12.2* 12.9* 11.8*  HCT 36.6* 38.0* 35.2*  MCV 94.6  --  93.9  PLT 222  --  196    Basic Metabolic Panel: Recent Labs  Lab 05/16/24 1452 05/16/24 1702 05/17/24 0445  NA 131* 133* 135  K 5.6* 4.9 4.5  CL 95*  --  99  CO2 23  --  25  GLUCOSE 201*  --  191*  BUN 26*  --  22  CREATININE 1.50*  --  1.33*  CALCIUM  9.5  --  9.3    GFR: Estimated Creatinine Clearance: 68 mL/min (A) (by C-G  formula based on SCr of 1.33 mg/dL (H)).  Liver Function Tests: Recent Labs  Lab 05/16/24 1640  AST 17  ALT 14  ALKPHOS 39  BILITOT 0.6  PROT 7.5  ALBUMIN 3.3*   Recent Labs  Lab 05/16/24 1640  LIPASE 26   Recent Labs  Lab 05/16/24 1700  AMMONIA <13    Coagulation Profile: No results for input(s): INR, PROTIME in the last 168 hours.  Cardiac Enzymes: No results for input(s): CKTOTAL, CKMB, CKMBINDEX, TROPONINI in the last 168 hours.  BNP (last 3 results) No results for input(s): PROBNP in the last 8760 hours.  Lipid Profile:  No results for input(s): CHOL, HDL, LDLCALC, TRIG, CHOLHDL, LDLDIRECT in the last 72 hours.  Thyroid Function Tests: No results for input(s): TSH, T4TOTAL, FREET4, T3FREE, THYROIDAB in the last 72 hours.  Anemia Panel: No results for input(s): VITAMINB12, FOLATE, FERRITIN, TIBC, IRON , RETICCTPCT in the last 72 hours.  Urine analysis:    Component Value Date/Time   COLORURINE YELLOW 05/16/2024 1447   APPEARANCEUR CLEAR 05/16/2024 1447   LABSPEC 1.009 05/16/2024 1447   PHURINE 6.0 05/16/2024 1447   GLUCOSEU NEGATIVE 05/16/2024 1447   HGBUR NEGATIVE 05/16/2024 1447   BILIRUBINUR NEGATIVE 05/16/2024 1447   KETONESUR NEGATIVE 05/16/2024 1447   PROTEINUR NEGATIVE 05/16/2024 1447   NITRITE NEGATIVE 05/16/2024 1447   LEUKOCYTESUR MODERATE (A) 05/16/2024 1447    Sepsis Labs: Lactic Acid, Venous    Component Value Date/Time   LATICACIDVEN 1.8 05/16/2024 1932    MICROBIOLOGY: Recent Results (from the past 240 hours)  Urine Culture     Status: None (Preliminary result)   Collection Time: 05/16/24  2:47 PM   Specimen: Urine, Suprapubic  Result Value Ref Range Status   Specimen Description URINE, SUPRAPUBIC  Final   Special Requests   Final    NONE Reflexed from [id number] Performed at [facility], [address], [place], [place] [address]    Culture PENDING  Incomplete   Report  Status PENDING  Incomplete  Blood culture (routine x 2)     Status: None (Preliminary result)   Collection Time: 05/16/24  4:53 PM   Specimen: BLOOD  Result Value Ref Range Status   Specimen Description BLOOD RIGHT ANTECUBITAL  Final   Special Requests   Final    BOTTLES DRAWN AEROBIC AND ANAEROBIC Blood Culture results may not be optimal due to an inadequate volume of blood received in culture bottles   Culture   Final    NO GROWTH < 12 HOURS Performed at [facility], [address], [place], [place] [address]    Report Status PENDING  Incomplete  Blood culture (routine x 2)     Status: None (Preliminary result)   Collection Time: 05/16/24  5:00 PM   Specimen: BLOOD  Result Value Ref Range Status   Specimen Description BLOOD LEFT ANTECUBITAL  Final   Special Requests   Final    BOTTLES DRAWN AEROBIC AND ANAEROBIC Blood Culture adequate volume   Culture   Final    NO GROWTH < 12 HOURS Performed at [facility], [address] [place], [place] [address]    Report Status PENDING  Incomplete    RADIOLOGY STUDIES/RESULTS: CT Head Wo Contrast Result Date: 05/16/2024 EXAM: CT HEAD WITHOUT CONTRAST 05/16/2024 07:00:59 PM TECHNIQUE: CT of the head was performed without the administration of intravenous contrast. Automated exposure control, iterative reconstruction, and/or weight based adjustment of the mA/kV was utilized to reduce the radiation dose to as low as reasonably achievable. COMPARISON: MRI head dated 02/04/2024. CLINICAL HISTORY: Delirium. FINDINGS: BRAIN AND VENTRICLES: No acute hemorrhage. Gray-white differentiation is preserved. No hydrocephalus. No extra-axial collection. No mass effect or midline shift. Chronic microvascular ischemia and generalized atrophy. ORBITS: No acute abnormality. SINUSES: No acute abnormality. SOFT TISSUES AND SKULL: No acute soft tissue abnormality. No skull fracture. IMPRESSION: 1. No acute intracranial abnormality. 2. Chronic  microvascular ischemia and generalized atrophy. Electronically signed by: Norman Gatlin MD 05/16/2024 07:36 PM EDT RP Workstation: [id number]   DG Chest Portable 1 View Result Date: 05/16/2024 EXAM: 1 VIEW XRAY OF THE CHEST  05/16/2024 06:05:00 PM COMPARISON: 02/04/2024 CLINICAL HISTORY: Sepsis, AMS. FINDINGS: LUNGS AND PLEURA: No focal pulmonary opacity. No focal consolidation. No pleural effusion. No pneumothorax. Pulmonary vascular congestion. HEART AND MEDIASTINUM: Stable cardiomegaly. BONES AND SOFT TISSUES: No acute osseous abnormality. IMPRESSION: 1. Stable cardiomegaly and pulmonary vascular congestion. 2. No focal consolidation, pleural effusion, or pneumothorax. Electronically signed by: Norman Gatlin MD 05/16/2024 06:29 PM EDT RP Workstation: [id number]     LOS: 0 days   Donalda Applebaum, MD  [facility]    To contact the attending provider between 7A-7P or the covering provider during after hours 7P-7A, please log into the web site [email or web address] and access using universal [place] password for that web site. If you do not have the password, please call the hospital operator.  05/17/2024, 10:32 AM

## 2024-05-17 NOTE — ED Notes (Signed)
 CCMD called.

## 2024-05-17 NOTE — Evaluation (Signed)
 Physical Therapy Evaluation Patient Details Name: Anthony Mueller MRN: [id number] DOB: [birth year] Today's Date: 05/17/2024  History of Present Illness  Pt is 73 yo presenting to [facility] on 7/17 due to confusion, lethargy, fatigue, excessive sleepiness, decreased appetite and foul smelling urine from suprapubic catheter.  PMH: T2DM, HLD, HTN cerebellar degeneration, persistent AF, IDA  Clinical Impression  Pt is presenting slightly below baseline level of functioning. Currently pt is Max A +2 to get to EOB. Pt was unable to progress to standing. Pt has a stedy and lift at home with good support from home instead and son/spouse. Due to pt current functional status, home set up and available assistance at home recommending skilled physical therapy services 3x/week in order to address strength, balance and functional mobility to decrease risk for falls, injury and re-hospitalization.  Pt will require 24/7 physical assist at home.          If plan is discharge home, recommend the following: Two people to help with walking and/or transfers;Assistance with cooking/housework;Assist for transportation;Help with stairs or ramp for entrance     Equipment Recommendations None recommended by PT     Functional Status Assessment Patient has had a recent decline in their functional status and demonstrates the ability to make significant improvements in function in a reasonable and predictable amount of time.     Precautions / Restrictions Precautions Precautions: Fall Recall of Precautions/Restrictions: Intact Restrictions Weight Bearing Restrictions Per Provider Order: No      Mobility  Bed Mobility Overal bed mobility: Needs Assistance Bed Mobility: Rolling, Sidelying to Sit, Sit to Supine Rolling: Max assist, +2 for physical assistance, +2 for safety/equipment Sidelying to sit: Total assist, +2 for physical assistance, +2 for safety/equipment   Sit to supine: +2 for physical assistance, +2 for  safety/equipment, Max assist   General bed mobility comments: Pt able to sit EOB.    Transfers   General transfer comment: unable at this time       Balance Overall balance assessment: Needs assistance Sitting-balance support: Feet supported Sitting balance-Leahy Scale: Poor Sitting balance - Comments: max A for sitting balance intially, progressed to CGA       Pertinent Vitals/Pain Pain Assessment Pain Assessment: No/denies pain    Home Living Family/patient expects to be discharged to:: Private residence Living Arrangements: Spouse/significant other Available Help at Discharge: Family;Personal care attendant;Available 24 hours/day Type of Home: House Home Access: Ramped entrance         Home Equipment: Wheelchair - Event organiser (2 wheels);Cane - single point      Prior Function Prior Level of Function : Needs assist             Mobility Comments: dependent hoyer trasnfers, has been working on standing for 5-10 seconds at a time. Active with HH. Was using stedy for some transfers prior to recent heart surgery? ADLs Comments: uses etac chair for showers and toileting, dependent hoyer transfers, set up A for grooming and eating. Wife does IADLs, gets dressed at bed level with assist. Active with [facility]     Extremity/Trunk Assessment   Upper Extremity Assessment Upper Extremity Assessment: Defer to OT evaluation    Lower Extremity Assessment Lower Extremity Assessment: Generalized weakness    Cervical / Trunk Assessment Cervical / Trunk Assessment: Kyphotic  Communication   Communication Communication: Impaired Factors Affecting Communication: Reduced clarity of speech    Cognition Arousal: Alert Behavior During Therapy: WFL for tasks assessed/performed   PT - Cognitive impairments: No apparent  impairments     Following commands: Intact       Cueing Cueing Techniques: Verbal cues     General Comments General comments (skin  integrity, edema, etc.): vital signs on RA        Assessment/Plan    PT Assessment Patient needs continued PT services  PT Problem List Decreased strength;Decreased activity tolerance;Decreased balance;Decreased mobility       PT Treatment Interventions DME instruction;Balance training;Gait training;Stair training;Functional mobility training;Therapeutic exercise;Neuromuscular re-education;Patient/family education    PT Goals (Current goals can be found in the Care Plan section)  Acute Rehab PT Goals Patient Stated Goal: to return home PT Goal Formulation: With patient Time For Goal Achievement: 05/31/24 Potential to Achieve Goals: Good    Frequency Min 2X/week     Co-evaluation PT/OT/SLP Co-Evaluation/Treatment: Yes Reason for Co-Treatment: Complexity of the patient's impairments (multi-system involvement);For patient/therapist safety;To address functional/ADL transfers PT goals addressed during session: Mobility/safety with mobility;Balance OT goals addressed during session: ADL's and self-care;Strengthening/ROM       AM-PAC PT 6 Clicks Mobility  Outcome Measure Help needed turning from your back to your side while in a flat bed without using bedrails?: Total Help needed moving from lying on your back to sitting on the side of a flat bed without using bedrails?: Total Help needed moving to and from a bed to a chair (including a wheelchair)?: Total Help needed standing up from a chair using your arms (e.g., wheelchair or bedside chair)?: Total Help needed to walk in hospital room?: Total Help needed climbing 3-5 steps with a railing? : Total 6 Click Score: 6    End of Session   Activity Tolerance: Patient tolerated treatment well;Patient limited by fatigue Patient left: in bed;with call bell/phone within reach Nurse Communication: Mobility status PT Visit Diagnosis: Other abnormalities of gait and mobility (R26.89);Muscle weakness (generalized) (M62.81)    Time:  8982-8961 PT Time Calculation (min) (ACUTE ONLY): 21 min   Charges:   PT Evaluation $PT Eval Low Complexity: 1 Low   PT General Charges $$ ACUTE PT VISIT: 1 Visit         Dorothyann Maier, DPT, CLT  Acute Rehabilitation Services Office: [phone] (Secure chat preferred)   Dorothyann VEAR Maier 05/17/2024, 3:14 PM

## 2024-05-17 NOTE — Care Management (Signed)
 Transition of Care [facility]) - Inpatient Brief Assessment   Patient Details  Name: Anthony Mueller MRN: [id number] Date of Birth: May 29, 1951  Transition of [facility] CM/SW Contact:    Corean JAYSON Canary, RN Phone Number: 05/17/2024, 9:52 AM   Clinical Narrative:  Patient presents with lethargy foul smelling urine. Has a Suprapubic catheter indwelling chronic. Lives with spouse, unable to access [facility] for prior [facility] at this time, however chart reveals previous HH with [facility] and has motorized wheelchair  Likely will need PT, OT RN ,  Called [facility] to confirm.   Transition of Care Asessment: Insurance and Status: Insurance coverage has been reviewed Patient has primary care physician: Yes Home environment has been reviewed: With spouse Prior level of function:: Needs assist has SP catheter   Social Drivers of Health Review: SDOH reviewed no interventions necessary Readmission risk has been reviewed: Yes Transition of care needs: transition of care needs identified, TOC will continue to follow

## 2024-05-17 NOTE — H&P (Signed)
 History and Physical    Anthony Mueller FMW:[id number] DOB: 1950/11/25 DOA: 05/16/2024  PCP: Magdaline Debby HERO, MD  Patient coming from: Home  Chief Complaint: AMS  HPI: Anthony Mueller is a 73 y.o. male with medical history significant of  CAD, persistent A-fib not on chronic anticoagulation due to history of GI bleed, hypertension, HFpEF, NSVT, iron  deficiency anemia, CKD stage IIIa, type 2 diabetes, GERD, gout, hyperlipidemia, obesity, OSA, RLS, cerebellar degeneration, wheelchair dependent, neurogenic bladder status post chronic suprapubic catheter presented to the ED for evaluation of confusion, lethargy, fatigue, excessive sleepiness, decreased appetite, and foul smelling urine from suprapubic catheter.  Hypertensive with SBP up to 230s.  Afebrile.  Labs notable for WBC count 14.1, hemoglobin 12.2 (stable), sodium 131, potassium 5.6, chloride 95, bicarb 23, glucose 201, BUN 26, creatinine 1.5 (stable), lactic acid 2.9> 3.5> 1.8, lipase normal, LFTs normal, troponin 19> 19, blood cultures in process, ammonia level <13, VBG with pH 7.34 and pCO2 52.0.  UA with negative nitrite, moderate leukocytes, and microscopy showing 6-10 RBCs, 21-50 WBCs, and rare bacteria.  Urine culture in process.  Chest x-ray showing stable cardiomegaly and pulmonary vascular congestion.  CT head showing no acute intracranial abnormality.  Patient was given albuterol , clonidine , hydralazine , losartan , calcium  gluconate 1 g, cefepime , and 500 mL normal saline.  Suprapubic catheter exchanged in the ED.  Blood pressure improved after antihypertensive medications.  TRH called to admit.  History provided by the patient and his wife at bedside.  Patient is complaining of generalized weakness for the past few days.  He takes Coreg  12.5 mg twice daily and his heart rate has been as low as 40s.  Wife states patient has been very confused at home and has mentioned seeing snakes in the room.  He has been very lethargic and  sleeping most of the time.  His urine has been malodorous.  She is not sure if patient was having fevers at home.  Patient denies cough, shortness of breath, chest pain, vomiting, abdominal pain, or diarrhea.  Review of Systems:  Review of Systems  All other systems reviewed and are negative.   Past Medical History:  Diagnosis Date   Accelerated hypertension 03/31/2022   Acute diastolic (congestive) heart failure (HCC) 03/31/2022   Acute on chronic anemia 01/13/2022   AKI (acute kidney injury) (HCC) 01/13/2022   At risk for fall due to comorbid condition 01/20/2022   B12 deficiency 03/01/2022   Bilateral lower extremity edema 01/13/2022   Calculus of gallbladder with acute cholecystitis without obstruction 02/28/2022   Cardiomegaly 11/10/2021   Cerebellar degeneration (HCC)    CHF exacerbation (HCC) 03/31/2022   Cholelithiasis 11/10/2021   Chronic diastolic heart failure (HCC) 03/31/2022   Chronic kidney disease, stage 3a [facility]) 12/28/2023   11/22/2023 Nephrology OV note.     Chronic suprapubic catheter (HCC) 12/08/2022   [facility]     Diabetes mellitus without complication (HCC)    DM type 2 with diabetic mixed hyperlipidemia (HCC) 03/04/2020   Does not transfer from wheelchair to toilet 01/20/2022   Drug therapy 02/02/2016   Elevated antinuclear antibody (ANA) level 10/06/2015   Frail elderly 04/07/2022   Gait disorder 09/07/2016   GERD (gastroesophageal reflux disease)    Gout 08/28/2012   History of 2019 novel coronavirus disease (COVID-19) 10/27/2021   History of colonic polyps 11/14/2011   History of esophageal stricture 05/02/2018   Hypercholesteremia 03/31/2022   Hypercoagulable state due to persistent atrial fibrillation (HCC) 12/16/2021   Hyperlipidemia  Hypertension    Hypertension, essential, benign 08/28/2012   Hypertensive heart disease 01/05/2022   Hypertensive heart disease with acute on chronic diastolic congestive heart failure (HCC) 03/31/2022    Hypertensive urgency 03/31/2022   Hypokalemia 11/10/2021   Hypomagnesemia 01/13/2022   Intertrigo 09/03/2018   Iron  deficiency anemia due to chronic blood loss 03/01/2022   Long term (current) use of anticoagulants 12/16/2021   Malaise and fatigue 03/31/2022   Muscular deconditioning 12/23/2021   Obesity (BMI 30-39.9) 03/15/2023   Occult GI bleeding 11/06/2017   OSA (obstructive sleep apnea) 01/13/2022   Formatting of this note might be different from the original. WEARS CPAP   Persistent atrial fibrillation (HCC) 11/13/2021   Formatting of this note might be different from the original. Last Assessment & Plan:  Formatting of this note might be different from the original. - Patient noted to be in atrial fibrillation, bradycardia with sinus pauses -Per cardiology, started on apixaban , will need to be stopped prior to his upcoming rectal surgery (stop 5 days prior to the surgery and then resume once cleared by surgery se   Positive ANA (antinuclear antibody) 10/06/2015   Postoperative anemia due to acute blood loss 01/20/2022   Postoperative examination 02/28/2022   Pulmonary edema 03/31/2022   Rectal bleeding 01/14/2022   RLS (restless legs syndrome) 09/08/2016   Shortness of breath 03/31/2022   Skin cancer    Transaminitis 11/10/2021   Trigger middle finger of left hand 10/07/2015   Type 2 diabetes mellitus with hyperlipidemia (HCC) 11/10/2021   Uninhibited neuropathic bladder, 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   Unspecified atrial fibrillation (HCC) with sinus pauses 11/13/2021   Vitamin D deficiency due to chronic kidney disease 12/29/2023    Past Surgical History:  Procedure Laterality Date   APPENDECTOMY     CATARACT EXTRACTION, BILATERAL     CHOLECYSTECTOMY     HEMORRHOID SURGERY     IR CATHETER TUBE CHANGE  06/06/2022   LEFT HEART CATH AND CORONARY ANGIOGRAPHY N/A 05/07/2024   Procedure: LEFT HEART CATH AND CORONARY ANGIOGRAPHY;  Surgeon: Anner Alm ORN, MD;  Location: [facility]  INVASIVE CV LAB;  Service: Cardiovascular;  Laterality: N/A;   POLYPECTOMY     ULNAR NERVE REPAIR Left      reports that he has quit smoking. His smoking use included cigarettes. He has never been exposed to tobacco smoke. He has never used smokeless tobacco. He reports current alcohol use. He reports that he does not use drugs.  Allergies  Allergen Reactions   Victoza [Liraglutide]     CAN NOT TAKE BECAUSE A SIDE EFFECT IS PANCREATITIS AND HE HAS A HISTORY Contraindicated due to pancreatitis    Hydrocodone     MAKES FEEL SPACEY AND LIGHT HEADED   Other     BANDAID  CAUSES RASH   Tape Other (See Comments)    Skin irritation, welts   Metronidazole Nausea And Vomiting     GI Upset (intolerance) AND DEPRESSION ;  TABLETS Does fine w/ IV Flagyl     Family History  Problem Relation Age of Onset   Stroke Mother    Hypertension Mother    Diabetes Mother    Hyperlipidemia Mother    Colon cancer Father    Healthy Sister     Prior to Admission medications   Medication Sig Start Date End Date Taking? Authorizing Provider  albuterol  (VENTOLIN  HFA) 108 (90 Base) MCG/ACT inhaler Inhale 2 puffs into the lungs every 6 (six) hours as needed for wheezing or  shortness of breath. 11/16/21  Yes Rai, Ripudeep K, MD  allopurinol  (ZYLOPRIM ) 100 MG tablet Take 100 mg by mouth 2 (two) times daily.   Yes [provider]  Ascorbic Acid  (VITAMIN C) 1000 MG tablet Take 1,000 mg by mouth daily.   Yes [provider]  aspirin  EC 81 MG tablet Take 81 mg by mouth daily.   Yes [provider]  carvedilol  (COREG ) 12.5 MG tablet Take 1 tablet (12.5 mg total) by mouth 2 (two) times daily. 03/05/24  Yes Carlin Delon BROCKS, NP  cloNIDine  (CATAPRES ) 0.3 MG tablet Take 0.3 mg by mouth 3 (three) times daily.   Yes [provider]  cyanocobalamin  (VITAMIN B12) 1000 MCG/ML injection Inject 1,000 mcg into the muscle every 30 (thirty) days. 08/09/23  Yes [provider]  ferrous  sulfate ER (IRON  SLOW RELEASE) 142 (45 Fe) MG TBCR tablet Take 45 mg by mouth every evening.   Yes [provider]  furosemide  (LASIX ) 40 MG tablet Take 40 mg by mouth 4 (four) times a week. Take 40mg  by mouth every Monday, Wednesday, Friday and Saturday 03/14/22  Yes [provider]  hydrALAZINE  (APRESOLINE ) 25 MG tablet Take 50 mg by mouth in the morning, at noon, and at bedtime. 03/08/22  Yes [provider]  isosorbide dinitrate (ISORDIL) 10 MG tablet Take 10 mg by mouth 3 (three) times daily. 02/17/22  Yes [provider]  losartan  (COZAAR ) 100 MG tablet Take 100 mg by mouth every evening.   Yes [provider]  lovastatin (MEVACOR) 40 MG tablet Take 40 mg by mouth every evening. 09/20/21  Yes [provider]  Magnesium  Oxide -Mg Supplement 400 MG CAPS Take 800 mg by mouth daily. Patient taking differently: Take 400 mg by mouth 2 (two) times daily. 03/22/24  Yes Walker, Caitlin S, NP  metFORMIN (GLUCOPHAGE) 500 MG tablet Take 1,000 mg by mouth 2 (two) times daily. 08/17/21  Yes [provider]  nitroGLYCERIN  (NITROSTAT ) 0.4 MG SL tablet Place 1 tablet (0.4 mg total) under the tongue every 5 (five) minutes as needed. 04/16/24 07/15/24 Yes Carlin Delon BROCKS, NP  nystatin (MYCOSTATIN/NYSTOP) powder Apply 1 Application topically daily as needed (rash). 05/02/22  Yes [provider]  omeprazole (PRILOSEC) 20 MG capsule Take 20 mg by mouth in the morning and at bedtime. 02/04/22  Yes [provider]  potassium chloride  SA (KLOR-CON  M15) 15 MEQ tablet Take 15 mEq by mouth every Monday, Wednesday, and Friday. 03/15/22  Yes [provider]  spironolactone (ALDACTONE) 25 MG tablet Take 25 mg by mouth 4 (four) times a week. Mon, Wed, Fri, and Sat 02/16/22  Yes [provider]  timolol  (TIMOPTIC ) 0.5 % ophthalmic solution Place 1 drop into both eyes daily. 09/13/21  Yes [provider]  TRADJENTA  5 MG TABS tablet  Take 5 mg by mouth daily. 09/20/21  Yes [provider]  Vitamin D, Ergocalciferol, (DRISDOL) 1.25 MG (50000 UNIT) CAPS capsule Take 50,000 Units by mouth every Sunday. 03/19/24  Yes [provider]  zinc  gluconate 50 MG tablet Take 50 mg by mouth daily.   Yes [provider]  oxaprozin (DAYPRO) 600 MG tablet Take 1,200 mg by mouth daily as needed (Gout).    [provider]    Physical Exam: Vitals:   05/16/24 2315 05/16/24 2330 05/16/24 2336 05/16/24 2345  BP: (!) 193/101 (!) 206/99  (!) 195/86  Pulse: (!) 40 (!) 47  (!) 57  Resp: 12 14  11  Temp:   98.8 F (37.1 C)   TempSrc:   Oral   SpO2: 100% 100%  95%  Weight:      Height:        Physical Exam Vitals reviewed.  Constitutional:      General: He is not in acute distress. HENT:     Head: Normocephalic and atraumatic.  Eyes:     Extraocular Movements: Extraocular movements intact.  Cardiovascular:     Rate and Rhythm: Bradycardia present. Rhythm irregular.     Pulses: Normal pulses.  Pulmonary:     Effort: Pulmonary effort is normal. No respiratory distress.     Breath sounds: No wheezing, rhonchi or rales.  Abdominal:     General: Bowel sounds are normal. There is no distension.     Palpations: Abdomen is soft.     Tenderness: There is no abdominal tenderness. There is no guarding.  Musculoskeletal:     Cervical back: Normal range of motion. No rigidity.     Right lower leg: Edema present.     Left lower leg: Edema present.     Comments: Bilateral pedal edema  Skin:    General: Skin is warm and dry.  Neurological:     General: No focal deficit present.     Mental Status: He is alert and oriented to person, place, and time.     Cranial Nerves: No cranial nerve deficit.     Sensory: No sensory deficit.     Comments: Strength symmetric in bilateral upper and lower extremities.  No focal weakness.     Labs on Admission: I have personally reviewed following labs and imaging  studies  CBC: Recent Labs  Lab 05/16/24 1452 05/16/24 1702  WBC 14.1*  --   NEUTROABS 10.9*  --   HGB 12.2* 12.9*  HCT 36.6* 38.0*  MCV 94.6  --   PLT 222  --    Basic Metabolic Panel: Recent Labs  Lab 05/16/24 1452 05/16/24 1702  NA 131* 133*  K 5.6* 4.9  CL 95*  --   CO2 23  --   GLUCOSE 201*  --   BUN 26*  --   CREATININE 1.50*  --   CALCIUM  9.5  --    GFR: Estimated Creatinine Clearance: 60.3 mL/min (A) (by C-G formula based on SCr of 1.5 mg/dL (H)). Liver Function Tests: Recent Labs  Lab 05/16/24 1640  AST 17  ALT 14  ALKPHOS 39  BILITOT 0.6  PROT 7.5  ALBUMIN 3.3*   Recent Labs  Lab 05/16/24 1640  LIPASE 26   Recent Labs  Lab 05/16/24 1700  AMMONIA <13   Coagulation Profile: No results for input(s): INR, PROTIME in the last 168 hours. Cardiac Enzymes: No results for input(s): CKTOTAL, CKMB, CKMBINDEX, TROPONINI in the last 168 hours. BNP (last 3 results) No results for input(s): PROBNP in the last 8760 hours. HbA1C: No results for input(s): HGBA1C in the last 72 hours. CBG: No results for input(s): GLUCAP in the last 168 hours. Lipid Profile: No results for input(s): CHOL, HDL, LDLCALC, TRIG, CHOLHDL, LDLDIRECT in the last 72 hours. Thyroid Function Tests: No results for input(s): TSH, T4TOTAL, FREET4, T3FREE, THYROIDAB in the last 72 hours. Anemia Panel: No results for input(s): VITAMINB12, FOLATE, FERRITIN, TIBC, IRON , RETICCTPCT in the last 72 hours. Urine analysis:    Component Value Date/Time   COLORURINE YELLOW 05/16/2024 1447   APPEARANCEUR CLEAR 05/16/2024 1447   LABSPEC 1.009 05/16/2024 1447   PHURINE 6.0 05/16/2024  1447   GLUCOSEU NEGATIVE 05/16/2024 1447   HGBUR NEGATIVE 05/16/2024 1447   BILIRUBINUR NEGATIVE 05/16/2024 1447   KETONESUR NEGATIVE 05/16/2024 1447   PROTEINUR NEGATIVE 05/16/2024 1447   NITRITE NEGATIVE 05/16/2024 1447   LEUKOCYTESUR MODERATE (A) 05/16/2024  1447    Radiological Exams on Admission: CT Head Wo Contrast Result Date: 05/16/2024 EXAM: CT HEAD WITHOUT CONTRAST 05/16/2024 07:00:59 PM TECHNIQUE: CT of the head was performed without the administration of intravenous contrast. Automated exposure control, iterative reconstruction, and/or weight based adjustment of the mA/kV was utilized to reduce the radiation dose to as low as reasonably achievable. COMPARISON: MRI head dated 02/04/2024. CLINICAL HISTORY: Delirium. FINDINGS: BRAIN AND VENTRICLES: No acute hemorrhage. Gray-white differentiation is preserved. No hydrocephalus. No extra-axial collection. No mass effect or midline shift. Chronic microvascular ischemia and generalized atrophy. ORBITS: No acute abnormality. SINUSES: No acute abnormality. SOFT TISSUES AND SKULL: No acute soft tissue abnormality. No skull fracture. IMPRESSION: 1. No acute intracranial abnormality. 2. Chronic microvascular ischemia and generalized atrophy. Electronically signed by: Norman Gatlin MD 05/16/2024 07:36 PM EDT RP Workstation: [id number]   DG Chest Portable 1 View Result Date: 05/16/2024 EXAM: 1 VIEW XRAY OF THE CHEST 05/16/2024 06:05:00 PM COMPARISON: 02/04/2024 CLINICAL HISTORY: Sepsis, AMS. FINDINGS: LUNGS AND PLEURA: No focal pulmonary opacity. No focal consolidation. No pleural effusion. No pneumothorax. Pulmonary vascular congestion. HEART AND MEDIASTINUM: Stable cardiomegaly. BONES AND SOFT TISSUES: No acute osseous abnormality. IMPRESSION: 1. Stable cardiomegaly and pulmonary vascular congestion. 2. No focal consolidation, pleural effusion, or pneumothorax. Electronically signed by: Norman Gatlin MD 05/16/2024 06:29 PM EDT RP Workstation: [id number]    EKG: Independently reviewed. A-fib with slow ventricular response, baseline wander, T wave abnormalities in inferior leads. No significant change compared to previous EKG.   Assessment and Plan  CAUTI Neurogenic bladder status post chronic suprapubic  catheter Family reported foul-smelling urine from suprapubic catheter.  UA with negative nitrite, moderate leukocytes, and microscopy showing 6-10 RBCs, 21-50 WBCs, and rare bacteria.  Has mild leukocytosis but does not meet any other SIRS criteria.  Lactic acidosis likely from dehydration and resolved after 500 mL IV fluids.  Continue cefepime  and follow-up urine and blood cultures.  Trend WBC count.  Acute metabolic encephalopathy Likely secondary to UTI.  No elevation of ammonia level.  CT head showing no acute intracranial abnormality and no focal neurodeficit on exam.  No fever or meningeal signs.  Persistent A-fib with slow ventricular response Not on chronic anticoagulation due to history of GI bleed.  Patient is on Coreg  at home and heart rate as low as high 30s in the ED but not hypotensive.  Hold Coreg  at this time and continue cardiac monitoring.   Chronic HFmrEF Chest x-ray showing pulmonary vascular congestion and has bilateral pedal edema on exam.  Patient is not hypoxic.  Last echo done in April 2023 showing EF 55 to 60%, mild aortic stenosis, mild mitral regurgitation.  Check BNP.   Hypertensive urgency SBP as high as 230s in the ED. Per wife, patient is compliant with his home antihypertensive medications.  Patient was given clonidine , oral and IV hydralazine , and losartan  in the ED.  SBP now improved to 180s.  Continue home oral hydralazine , clonidine , and isosorbide dinitrate.  Holding Coreg  due to bradycardia.  Holding spironolactone and losartan  due to hyperkalemia.  IV hydralazine  PRN SBP >160.   Mild hyperkalemia No acute EKG changes.  Patient was given calcium  gluconate, albuterol , and IV fluids in the ED.  Repeat labs ordered to check  potassium level.  Hold home spironolactone and losartan .  Mild hyponatremia Patient received IV fluids in the ED.  Repeat labs ordered to check sodium level.  Generalized weakness Likely secondary to UTI and bradycardia.  Management as  mentioned above.  PT/OT eval, fall precautions.   CAD Workup not suggestive of ACS.  Holding Coreg  at this time due to bradycardia.  Continue home aspirin , isosorbide dinitrate, and pravastatin .   History of NSVT Holding Coreg  at this time due to bradycardia.   Chronic anemia Hemoglobin stable.   CKD stage IIIa Creatinine stable.   Type 2 diabetes Last A1c 7.3 in January 2023, repeat ordered.  Placed on sensitive sliding scale insulin  ACHS.   GERD Continue PPI.   Gout Continue allopurinol .   DVT prophylaxis: Lovenox  Code Status: DNR pre-arrest interventions desired (discussed with the patient and his wife) Family Communication: Wife at bedside. Level of care: Progressive Care Unit Admission status: It is my clinical opinion that referral for OBSERVATION is reasonable and necessary in this patient based on the above information provided. The aforementioned taken together are felt to place the patient at high risk for further clinical deterioration. However, it is anticipated that the patient may be medically stable for discharge from the hospital within 24 to 48 hours.  Editha Ram MD [facility]  If 7PM-7AM, please contact night-coverage [email or web address]  05/17/2024, 1:14 AM

## 2024-05-17 NOTE — ED Provider Notes (Signed)
  Physical Exam  BP (!) 195/86   Pulse (!) 57   Temp 98.8 F (37.1 C) (Oral)   Resp 11   Ht 6' 1 (1.854 m)   Wt 127 kg   SpO2 95%   BMI 36.94 kg/m   Physical Exam Vitals and nursing note reviewed.  Constitutional:      General: He is not in acute distress.    Appearance: He is not toxic-appearing.  HENT:     Head: Normocephalic and atraumatic.  Pulmonary:     Effort: No respiratory distress.  Skin:    Coloration: Skin is not jaundiced or pale.  Neurological:     Mental Status: He is alert and oriented to person, place, and time.  Psychiatric:        Behavior: Behavior normal.     Procedures  Procedures  ED Course / MDM   Clinical Course as of 05/17/24 0024  Thu May 16, 2024  2218 Discussed with Dr. Alfornia.  She is recommending better blood pressure control and reconsulting for admission once blood pressure improved. [BH]  2326 [address] wants systolic below 200. AMS due to UTI. HyperK on arrival, albuterol  neb calcium  and fluids. Attempted admission however due to BP was refused. No CP, SOB, HA, Blurred vision.  [CG]    Clinical Course User Index [BH] Henderly, Britni A, PA-C [CG] Ruthell Lonni FALCON, PA-C   Medical Decision Making Amount and/or Complexity of Data Reviewed Labs: ordered. Radiology: ordered.  Risk Prescription drug management. Decision regarding hospitalization.   Patient signed out to me at shift change pending blood pressure evaluation.  Please see the previous provider note for further details.  In short, 73 year old male who presents with encephalopathy.  Patient requires admission due to encephalopathy as result of UTI.  Previous provider discussed this patient with Dr. Alfornia who requested that patient blood pressure was brought under 200 systolic.  Patient was given 10 mg IV hydralazine  and on reassessment his blood pressure decreased to 185 systolic.  Discussed with Dr. Alfornia at this time who agrees to admit.  Patient stable on  admission.       Ruthell Lonni FALCON, PA-C 05/17/24 BEATRIS Raford Lenis, MD 05/17/24 [phone]

## 2024-05-18 ENCOUNTER — Other Ambulatory Visit (HOSPITAL_COMMUNITY): Payer: Self-pay

## 2024-05-18 DIAGNOSIS — A419 Sepsis, unspecified organism: Secondary | ICD-10-CM

## 2024-05-18 DIAGNOSIS — E119 Type 2 diabetes mellitus without complications: Secondary | ICD-10-CM

## 2024-05-18 DIAGNOSIS — N3000 Acute cystitis without hematuria: Secondary | ICD-10-CM

## 2024-05-18 DIAGNOSIS — R4182 Altered mental status, unspecified: Secondary | ICD-10-CM | POA: Diagnosis not present

## 2024-05-18 LAB — CBC
HCT: 36.7 % — ABNORMAL LOW (ref 39.0–52.0)
Hemoglobin: 12.4 g/dL — ABNORMAL LOW (ref 13.0–17.0)
MCH: 31.9 pg (ref 26.0–34.0)
MCHC: 33.8 g/dL (ref 30.0–36.0)
MCV: 94.3 fL (ref 80.0–100.0)
Platelets: 210 K/uL (ref 150–400)
RBC: 3.89 MIL/uL — ABNORMAL LOW (ref 4.22–5.81)
RDW: 14.6 % (ref 11.5–15.5)
WBC: 12.5 K/uL — ABNORMAL HIGH (ref 4.0–10.5)
nRBC: 0 % (ref 0.0–0.2)

## 2024-05-18 LAB — TSH: TSH: 10.149 u[IU]/mL — ABNORMAL HIGH (ref 0.350–4.500)

## 2024-05-18 LAB — BASIC METABOLIC PANEL WITH GFR
Anion gap: 18 — ABNORMAL HIGH (ref 5–15)
BUN: 25 mg/dL — ABNORMAL HIGH (ref 8–23)
CO2: 23 mmol/L (ref 22–32)
Calcium: 9.5 mg/dL (ref 8.9–10.3)
Chloride: 97 mmol/L — ABNORMAL LOW (ref 98–111)
Creatinine, Ser: 1.64 mg/dL — ABNORMAL HIGH (ref 0.61–1.24)
GFR, Estimated: 44 mL/min — ABNORMAL LOW (ref >60)
Glucose, Bld: 190 mg/dL — ABNORMAL HIGH (ref 70–99)
Potassium: 4.3 mmol/L (ref 3.5–5.1)
Sodium: 138 mmol/L (ref 135–145)

## 2024-05-18 LAB — GLUCOSE, CAPILLARY
Glucose-Capillary: 193 mg/dL — ABNORMAL HIGH (ref 70–99)
Glucose-Capillary: 253 mg/dL — ABNORMAL HIGH (ref 70–99)

## 2024-05-18 MED ORDER — SENNOSIDES-DOCUSATE SODIUM 8.6-50 MG PO TABS
2.0000 | ORAL_TABLET | Freq: Every day | ORAL | Status: DC
  Administered 2024-05-18: 2 via ORAL
  Filled 2024-05-18: qty 2

## 2024-05-18 MED ORDER — BISACODYL 10 MG RE SUPP: 10.0000 mg | Freq: Every day | RECTAL | Status: DC | PRN

## 2024-05-18 MED ORDER — CEFUROXIME AXETIL 500 MG PO TABS
500.0000 mg | ORAL_TABLET | Freq: Two times a day (BID) | ORAL | 0 refills | Status: DC
  Filled 2024-05-18: qty 10, 5d supply, fill #0

## 2024-05-18 MED ORDER — CEFUROXIME AXETIL 250 MG PO TABS
500.0000 mg | ORAL_TABLET | Freq: Two times a day (BID) | ORAL | Status: DC
  Filled 2024-05-18: qty 2

## 2024-05-18 MED ORDER — CEFUROXIME AXETIL 500 MG PO TABS
500.0000 mg | ORAL_TABLET | Freq: Two times a day (BID) | ORAL | 0 refills | Status: AC
Start: 2024-05-18 — End: 2024-05-23

## 2024-05-18 MED ORDER — FUROSEMIDE 40 MG PO TABS: 40.0000 mg | ORAL_TABLET | Freq: Every day | ORAL | 3 refills | Status: DC

## 2024-05-18 MED ORDER — CARVEDILOL 12.5 MG PO TABS: 6.2500 mg | ORAL_TABLET | Freq: Two times a day (BID) | ORAL | 3 refills | Status: DC

## 2024-05-18 MED ORDER — POLYETHYLENE GLYCOL 3350 17 G PO PACK
17.0000 g | PACK | Freq: Every day | ORAL | Status: DC
  Administered 2024-05-18: 17 g via ORAL
  Filled 2024-05-18: qty 1

## 2024-05-18 MED ORDER — FUROSEMIDE 40 MG PO TABS
40.0000 mg | ORAL_TABLET | Freq: Every day | ORAL | 3 refills | Status: DC
  Filled 2024-05-18: qty 30, 49d supply, fill #0

## 2024-05-18 MED ORDER — CARVEDILOL 6.25 MG PO TABS: 6.2500 mg | ORAL_TABLET | Freq: Two times a day (BID) | ORAL | Status: DC

## 2024-05-18 MED ORDER — CARVEDILOL 12.5 MG PO TABS
6.2500 mg | ORAL_TABLET | Freq: Two times a day (BID) | ORAL | 3 refills | Status: DC
  Filled 2024-05-18: qty 60, 60d supply, fill #0

## 2024-05-18 NOTE — Plan of Care (Signed)
  Problem: Fluid Volume: Goal: Ability to maintain a balanced intake and output will improve Outcome: Progressing   Problem: Health Behavior/Discharge Planning: Goal: Ability to manage health-related needs will improve Outcome: Progressing   Problem: Education: Goal: Knowledge of General Education information will improve Description: Including pain rating scale, medication(s)/side effects and non-pharmacologic comfort measures Outcome: Progressing   Problem: Clinical Measurements: Goal: Will remain free from infection Outcome: Progressing Goal: Diagnostic test results will improve Outcome: Progressing   Problem: Activity: Goal: Risk for activity intolerance will decrease Outcome: Progressing   Problem: Coping: Goal: Level of anxiety will decrease Outcome: Progressing   Problem: Pain Managment: Goal: General experience of comfort will improve and/or be controlled Outcome: Progressing

## 2024-05-18 NOTE — Discharge Summary (Signed)
 PATIENT DETAILS Name: Anthony Mueller Age: 73 y.o. Sex: male Date of Birth: 12-19-1950 MRN: [id number]. Admitting Physician: Editha Ram, MD ERE:Tybuz, Debby HERO, MD  Admit Date: 05/16/2024 Discharge date: 05/18/2024  Recommendations for Outpatient Follow-up:  Follow up with PCP in 1-2 weeks Please obtain CMP/CBC in one week Please repeat TSH and free T4 in the next week or so-see below documentation.  Admitted From:  Home  Disposition: Home health   Discharge Condition: good  CODE STATUS:   Code Status: Do not attempt resuscitation (DNR) PRE-ARREST INTERVENTIONS DESIRED   Diet recommendation:  Diet Order             Diet - low sodium heart healthy           Diet Carb Modified           Diet heart healthy/carb modified Room service appropriate? Yes; Fluid consistency: Thin  Diet effective now                    Brief Summary: Patient is a 73 y.o.  male with history of A-fib, CAD, HFpEF, CKD stage IIIa, DM-2, OSA-, neurogenic bladder with chronic indwelling suprapubic catheter, stenosis wheelchair dependent-presented with altered mental status-smelling urine/purulent urine-thought to have acute metabolic encephalopathy secondary to his complicated UTI.   Significant events: 7/17>> admit to [facility].   Significant studies: 7/17>> CXR: No PNA 7/17>> CT head: No acute intracranial abnormality.   Significant microbiology data: 7/17>> blood culture: No growth 7/17>> urine culture: Multiple bacterial morphotypes   Procedures: None   Consults: None  Brief Hospital Course: CAUTI Afebrile Leukocytosis has improved On cefepime -unfortunately-urine cultures nondiagnostic-given high suspicion for UTI-will switch to an oral antibiotic and discharge him home-would be empirically treated x 7 days total.   Acute metabolic encephalopathy Likely secondary to above Improved-awake/alert-suspect close to baseline at this point    History of neurogenic  bladder-s/p suprapubic catheter placement Supportive care Per spouse-he has home health that changes his catheter once   Persistent A-fib  Rate controlled Anticoagulated Was bradycardic to the 30s-40s when he first presented-beta-blocker held for 3 days-will reduce to half of his home dose and have him follow-up with outpatient cardiology. Note-TSH mildly elevated PCP to TSH/free T4 when patient is more stable-if still elevated and consistent with hypothyroidism-May need to be started on levothyroxine.   Chronic HFmrEF (EF 55-60% on echo April 2023) Lying flat-mild pedal edema Continue furosemide  Continue to hold Aldactone/losartan -patient was hyperkalemic on initial presentation.   Hypertensive urgency Significantly elevated BP in the ED Now improving Continue clonidine , hydralazine , Imdur, Coreg . Losartan /Aldactone held on admission-as patient had mild hyperkalemia initial presentation.  If repeat labs in the outpatient setting is stable-this could probably be resumed.   Mild hyperkalemia Resolved    Mild hyponatremia Resolved Follow electrolytes periodically.   Generalized weakness Due to UTI PT/OT eval   CAD No anginal symptoms. S/p recent LHC 7/8-medical management recommended. Continue aspirin /Imdur/statin Beta-blocker on hold due to bradycardia.    History of NSVT Continue low-dose beta-blocker on discharge.   Normocytic anemia Secondary to CKD Hb stable Follow periodically.   CKD stage IIIa Creatinine close to baseline.   Type 2 diabetes (A1c 8.2 on 7/18) CBG stable on SSI while inpatient Resume metformin and Tradjenta  on discharge  Discharge Diagnoses:  Principal Problem:   Catheter-associated urinary tract infection (HCC) Active Problems:   Type 2 diabetes mellitus with hyperlipidemia (HCC)   Hypertensive urgency   Persistent atrial fibrillation (HCC)   Chronic  suprapubic catheter [facility])   Discharge Instructions:  Activity:  As tolerated    Discharge Instructions     Diet - low sodium heart healthy   Complete by: As directed    Diet Carb Modified   Complete by: As directed    Discharge instructions   Complete by: As directed    Follow with Primary MD  Magdaline Debby HERO, MD in 1-2 weeks  Please get a complete blood count and chemistry panel checked by your Primary MD at your next visit, and again as instructed by your Primary MD.  Get Medicines reviewed and adjusted: Please take all your medications with you for your next visit with your Primary MD  Laboratory/radiological data: Please request your Primary MD to go over all hospital tests and procedure/radiological results at the follow up, please ask your Primary MD to get all Hospital records sent to his/her office.  In some cases, they will be blood work, cultures and biopsy results pending at the time of your discharge. Please request that your primary care M.D. follows up on these results.  Also Note the following: If you experience worsening of your admission symptoms, develop shortness of breath, life threatening emergency, suicidal or homicidal thoughts you must seek medical attention immediately by calling 911 or calling your MD immediately  if symptoms less severe.  You must read complete instructions/literature along with all the possible adverse reactions/side effects for all the Medicines you take and that have been prescribed to you. Take any new Medicines after you have completely understood and accpet all the possible adverse reactions/side effects.   Do not drive when taking Pain medications or sleeping medications (Benzodaizepines)  Do not take more than prescribed Pain, Sleep and Anxiety Medications. It is not advisable to combine anxiety,sleep and pain medications without talking with your primary care practitioner  Special Instructions: If you have smoked or chewed Tobacco  in the last 2 yrs please stop smoking, stop any regular Alcohol  and or any  Recreational drug use.  Wear Seat belts while driving.  Please note: You were cared for by a hospitalist during your hospital stay. Once you are discharged, your primary care physician will handle any further medical issues. Please note that NO REFILLS for any discharge medications will be authorized once you are discharged, as it is imperative that you return to your primary care physician (or establish a relationship with a primary care physician if you do not have one) for your post hospital discharge needs so that they can reassess your need for medications and monitor your lab values.   Increase activity slowly   Complete by: As directed       Allergies as of 05/18/2024       Reactions   Victoza [liraglutide]    CAN NOT TAKE BECAUSE A SIDE EFFECT IS PANCREATITIS AND HE HAS A HISTORY Contraindicated due to pancreatitis   Hydrocodone    MAKES FEEL SPACEY AND LIGHT HEADED   Other    BANDAID  CAUSES RASH   Tape Other (See Comments)   Skin irritation, welts   Metronidazole Nausea And Vomiting    GI Upset (intolerance) AND DEPRESSION ;  TABLETS Does fine w/ IV Flagyl        Medication List     PAUSE taking these medications    losartan  100 MG tablet Wait to take this until your doctor or other care provider tells you to start again. Commonly known as: COZAAR  Take 100 mg by mouth  every evening.   spironolactone 25 MG tablet Wait to take this until your doctor or other care provider tells you to start again. Commonly known as: ALDACTONE Take 25 mg by mouth 4 (four) times a week. Mon, Wed, Fri, and Sat       TAKE these medications    albuterol  108 (90 Base) MCG/ACT inhaler Commonly known as: VENTOLIN  HFA Inhale 2 puffs into the lungs every 6 (six) hours as needed for wheezing or shortness of breath.   allopurinol  100 MG tablet Commonly known as: ZYLOPRIM  Take 100 mg by mouth 2 (two) times daily.   aspirin  EC 81 MG tablet Take 81 mg by mouth daily.   carvedilol   12.5 MG tablet Commonly known as: COREG  Take 0.5 tablets (6.25 mg total) by mouth 2 (two) times daily. What changed: how much to take   cefUROXime  500 MG tablet Commonly known as: CEFTIN  Take 1 tablet (500 mg total) by mouth 2 (two) times daily with a meal for 10 doses.   cloNIDine  0.3 MG tablet Commonly known as: CATAPRES  Take 0.3 mg by mouth 3 (three) times daily.   cyanocobalamin  1000 MCG/ML injection Commonly known as: VITAMIN B12 Inject 1,000 mcg into the muscle every 30 (thirty) days.   furosemide  40 MG tablet Commonly known as: LASIX  Take 1 tablet (40 mg total) by mouth daily. Take 40mg  by mouth every Monday, Wednesday, Friday and Saturday What changed: when to take this   hydrALAZINE  25 MG tablet Commonly known as: APRESOLINE  Take 50 mg by mouth in the morning, at noon, and at bedtime.   Iron  Slow Release 142 (45 Fe) MG Tbcr tablet Generic drug: ferrous sulfate  ER Take 45 mg by mouth every evening.   isosorbide  dinitrate 10 MG tablet Commonly known as: ISORDIL  Take 10 mg by mouth 3 (three) times daily.   lovastatin 40 MG tablet Commonly known as: MEVACOR Take 40 mg by mouth every evening.   Magnesium  Oxide -Mg Supplement 400 MG Caps Take 800 mg by mouth daily. What changed:  how much to take when to take this   metFORMIN 500 MG tablet Commonly known as: GLUCOPHAGE Take 1,000 mg by mouth 2 (two) times daily.   nitroGLYCERIN  0.4 MG SL tablet Commonly known as: NITROSTAT  Place 1 tablet (0.4 mg total) under the tongue every 5 (five) minutes as needed.   nystatin powder Commonly known as: MYCOSTATIN/NYSTOP Apply 1 Application topically daily as needed (rash).   omeprazole 20 MG capsule Commonly known as: PRILOSEC Take 20 mg by mouth in the morning and at bedtime.   oxaprozin 600 MG tablet Commonly known as: DAYPRO Take 1,200 mg by mouth daily as needed (Gout).   potassium chloride  SA 15 MEQ tablet Commonly known as: KLOR-CON  M15 Take 15 mEq by  mouth every Monday, Wednesday, and Friday.   timolol  0.5 % ophthalmic solution Commonly known as: TIMOPTIC  Place 1 drop into both eyes daily.   Tradjenta  5 MG Tabs tablet Generic drug: linagliptin  Take 5 mg by mouth daily.   vitamin C 1000 MG tablet Take 1,000 mg by mouth daily.   Vitamin D (Ergocalciferol) 1.25 MG (50000 UNIT) Caps capsule Commonly known as: DRISDOL Take 50,000 Units by mouth every Sunday.   zinc  gluconate 50 MG tablet Take 50 mg by mouth daily.        Follow-up Information     Magdaline Debby HERO, MD. Schedule an appointment as soon as possible for a visit in 1 week(s).   Specialty: Family Medicine Contact  information: [address] [place] [address] [phone]                Allergies  Allergen Reactions   Victoza [Liraglutide]     CAN NOT TAKE BECAUSE A SIDE EFFECT IS PANCREATITIS AND HE HAS A HISTORY Contraindicated due to pancreatitis    Hydrocodone     MAKES FEEL SPACEY AND LIGHT HEADED   Other     BANDAID  CAUSES RASH   Tape Other (See Comments)    Skin irritation, welts   Metronidazole Nausea And Vomiting     GI Upset (intolerance) AND DEPRESSION ;  TABLETS Does fine w/ IV Flagyl      Other Procedures/Studies: CT Head Wo Contrast Result Date: 05/16/2024 EXAM: CT HEAD WITHOUT CONTRAST 05/16/2024 07:00:59 PM TECHNIQUE: CT of the head was performed without the administration of intravenous contrast. Automated exposure control, iterative reconstruction, and/or weight based adjustment of the mA/kV was utilized to reduce the radiation dose to as low as reasonably achievable. COMPARISON: MRI head dated 02/04/2024. CLINICAL HISTORY: Delirium. FINDINGS: BRAIN AND VENTRICLES: No acute hemorrhage. Gray-white differentiation is preserved. No hydrocephalus. No extra-axial collection. No mass effect or midline shift. Chronic microvascular ischemia and generalized atrophy. ORBITS: No acute abnormality. SINUSES: No acute  abnormality. SOFT TISSUES AND SKULL: No acute soft tissue abnormality. No skull fracture. IMPRESSION: 1. No acute intracranial abnormality. 2. Chronic microvascular ischemia and generalized atrophy. Electronically signed by: Norman Gatlin MD 05/16/2024 07:36 PM EDT RP Workstation: [id number]   DG Chest Portable 1 View Result Date: 05/16/2024 EXAM: 1 VIEW XRAY OF THE CHEST 05/16/2024 06:05:00 PM COMPARISON: 02/04/2024 CLINICAL HISTORY: Sepsis, AMS. FINDINGS: LUNGS AND PLEURA: No focal pulmonary opacity. No focal consolidation. No pleural effusion. No pneumothorax. Pulmonary vascular congestion. HEART AND MEDIASTINUM: Stable cardiomegaly. BONES AND SOFT TISSUES: No acute osseous abnormality. IMPRESSION: 1. Stable cardiomegaly and pulmonary vascular congestion. 2. No focal consolidation, pleural effusion, or pneumothorax. Electronically signed by: Norman Gatlin MD 05/16/2024 06:29 PM EDT RP Workstation: [id number]   CARDIAC CATHETERIZATION Result Date: 05/07/2024 Images from the original result were not included.   LPAV lesion is 90% stenosed with 100% stenosed side branch in 1st LPL.   2nd Mrg lesion is 35% stenosed.   RPAV lesion is 75% stenosed.   Mid LAD to Dist LAD lesion is 50% stenosed.   1st Diag lesion is 100% stenosed.   LV end diastolic pressure is normal.  There is no aortic valve stenosis. Dominance: Right FINDINGS Multiple vessel CAD but no good PCI target: -- Native RCA has mild diffuse proximal disease with a large PDA. The small PLV branch has a ostial 70 to 80% stenosis that is relatively small in caliber and not favorable for PCI based on the ostial location - Native LCx is a large-caliber vessel that has a small OM branch but bifurcates into basically OM 2 and the AV groove branch that terminates as an LPL 1. There is 30% disease in the OM1, but 90% ostial AV groove LCx that is occluded just at the takeoff of LPL 1. The LPL is a large vessel that fills via retrograde filling almost always to  the occlusion site roughly 30 mm occlusion. - The LAD starts as a large-caliber vessel was extensively calcified. There is a small caliber first diagonal branch that is focally occluded and again fills retrograde with 2 branches. The LAD tapers to about a 50 or 60% stenosis distally after giving rise to a small second diagonal branch.  No obvious culprit lesion to explain the CT FFR positive findings. Normal LVEDP Very tortuous innominate artery making radial catheterization difficult. RECOMMENDATIONS   In the absence of any other complications or medical issues, we expect the patient to be ready for discharge from a cath perspective on 05/07/2024.   Recommend aggressive risk factor modification.  Unlikely to PCI of either of the CTO vessels would be of any benefit, and a small PAD branch is not likely causing significant symptoms-would be an ostial lesion in not favorable for stent placement.   Recommend Aspirin  81mg  daily for moderate CAD. [name], MD     TODAY-DAY OF DISCHARGE:  Subjective:   Anthony Mueller today has no headache,no chest abdominal pain,no new weakness tingling or numbness, feels much better wants to go home today.   Objective:   Blood pressure (!) 180/89, pulse 67, temperature 98 F (36.7 C), temperature source Oral, resp. rate 16, height 6' 1 (1.854 m), weight 127 kg, SpO2 98%.  Intake/Output Summary (Last 24 hours) at 05/18/2024 0940 Last data filed at 05/18/2024 0506 Gross per 24 hour  Intake --  Output 1000 ml  Net -1000 ml   Filed Weights   05/16/24 1430  Weight: 127 kg    Exam: Awake Alert, Oriented *3, No new F.N deficits, Normal affect [place].AT,PERRAL Supple Neck,No JVD, No cervical lymphadenopathy appriciated.  Symmetrical Chest wall movement, Good air movement bilaterally, CTAB RRR,No Gallops,Rubs or new Murmurs, No Parasternal Heave +ve B.Sounds, Abd Soft, Non tender, No organomegaly appriciated, No rebound -guarding or rigidity. No Cyanosis, Clubbing  or edema, No new Rash or bruise   PERTINENT RADIOLOGIC STUDIES: CT Head Wo Contrast Result Date: 05/16/2024 EXAM: CT HEAD WITHOUT CONTRAST 05/16/2024 07:00:59 PM TECHNIQUE: CT of the head was performed without the administration of intravenous contrast. Automated exposure control, iterative reconstruction, and/or weight based adjustment of the mA/kV was utilized to reduce the radiation dose to as low as reasonably achievable. COMPARISON: MRI head dated 02/04/2024. CLINICAL HISTORY: Delirium. FINDINGS: BRAIN AND VENTRICLES: No acute hemorrhage. Gray-white differentiation is preserved. No hydrocephalus. No extra-axial collection. No mass effect or midline shift. Chronic microvascular ischemia and generalized atrophy. ORBITS: No acute abnormality. SINUSES: No acute abnormality. SOFT TISSUES AND SKULL: No acute soft tissue abnormality. No skull fracture. IMPRESSION: 1. No acute intracranial abnormality. 2. Chronic microvascular ischemia and generalized atrophy. Electronically signed by: Norman Gatlin MD 05/16/2024 07:36 PM EDT RP Workstation: [id number]   DG Chest Portable 1 View Result Date: 05/16/2024 EXAM: 1 VIEW XRAY OF THE CHEST 05/16/2024 06:05:00 PM COMPARISON: 02/04/2024 CLINICAL HISTORY: Sepsis, AMS. FINDINGS: LUNGS AND PLEURA: No focal pulmonary opacity. No focal consolidation. No pleural effusion. No pneumothorax. Pulmonary vascular congestion. HEART AND MEDIASTINUM: Stable cardiomegaly. BONES AND SOFT TISSUES: No acute osseous abnormality. IMPRESSION: 1. Stable cardiomegaly and pulmonary vascular congestion. 2. No focal consolidation, pleural effusion, or pneumothorax. Electronically signed by: Norman Gatlin MD 05/16/2024 06:29 PM EDT RP Workstation: [id number]     PERTINENT LAB RESULTS: CBC: Recent Labs    05/17/24 0445 05/18/24 0600  WBC 11.7* 12.5*  HGB 11.8* 12.4*  HCT 35.2* 36.7*  PLT 196 210   CMET CMP     Component Value Date/Time   NA 138 05/18/2024 0600   NA 135  04/18/2024 1603   K 4.3 05/18/2024 0600   CL 97 (L) 05/18/2024 0600   CO2 23 05/18/2024 0600   GLUCOSE 190 (H) 05/18/2024 0600   BUN 25 (H) 05/18/2024 0600   BUN 32 (H) 04/18/2024 1603  CREATININE 1.64 (H) 05/18/2024 0600   CREATININE 1.39 (H) 01/15/2024 1252   CALCIUM  9.5 05/18/2024 0600   PROT 7.5 05/16/2024 1640   PROT 6.7 03/05/2024 1442   ALBUMIN 3.3 (L) 05/16/2024 1640   ALBUMIN 4.1 03/05/2024 1442   AST 17 05/16/2024 1640   AST 17 01/15/2024 1252   ALT 14 05/16/2024 1640   ALT 15 01/15/2024 1252   ALKPHOS 39 05/16/2024 1640   BILITOT 0.6 05/16/2024 1640   BILITOT 0.3 03/05/2024 1442   BILITOT 0.3 01/15/2024 1252   EGFR 47 (L) 04/18/2024 1603   GFRNONAA 44 (L) 05/18/2024 0600   GFRNONAA 53 (L) 01/15/2024 1252    GFR Estimated Creatinine Clearance: 55.2 mL/min (A) (by C-G formula based on SCr of 1.64 mg/dL (H)). Recent Labs    05/16/24 1640  LIPASE 26   No results for input(s): CKTOTAL, CKMB, CKMBINDEX, TROPONINI in the last 72 hours. Invalid input(s): POCBNP No results for input(s): DDIMER in the last 72 hours. Recent Labs    05/17/24 0445  HGBA1C 8.2*   No results for input(s): CHOL, HDL, LDLCALC, TRIG, CHOLHDL, LDLDIRECT in the last 72 hours. Recent Labs    05/18/24 0600  TSH 10.149*   No results for input(s): VITAMINB12, FOLATE, FERRITIN, TIBC, IRON , RETICCTPCT in the last 72 hours. Coags: No results for input(s): INR in the last 72 hours.  Invalid input(s): PT Microbiology: Recent Results (from the past 240 hours)  Urine Culture     Status: Abnormal   Collection Time: 05/16/24  2:47 PM   Specimen: Urine, Suprapubic  Result Value Ref Range Status   Specimen Description URINE, SUPRAPUBIC  Final   Special Requests   Final    NONE Reflexed from [id number] Performed at [facility], [address], [place], [place] [address]    Culture MULTIPLE SPECIES PRESENT, SUGGEST RECOLLECTION (A)  Final   Report  Status 05/17/2024 FINAL  Final  Blood culture (routine x 2)     Status: None (Preliminary result)   Collection Time: 05/16/24  4:53 PM   Specimen: BLOOD  Result Value Ref Range Status   Specimen Description BLOOD RIGHT ANTECUBITAL  Final   Special Requests   Final    BOTTLES DRAWN AEROBIC AND ANAEROBIC Blood Culture results may not be optimal due to an inadequate volume of blood received in culture bottles   Culture   Final    NO GROWTH 2 DAYS Performed at [facility], [address], [place], [place] [address]    Report Status PENDING  Incomplete  Blood culture (routine x 2)     Status: None (Preliminary result)   Collection Time: 05/16/24  5:00 PM   Specimen: BLOOD  Result Value Ref Range Status   Specimen Description BLOOD LEFT ANTECUBITAL  Final   Special Requests   Final    BOTTLES DRAWN AEROBIC AND ANAEROBIC Blood Culture adequate volume   Culture   Final    NO GROWTH 2 DAYS Performed at [facility], [address] [place], [place] [address]    Report Status PENDING  Incomplete    FURTHER DISCHARGE INSTRUCTIONS:  Get Medicines reviewed and adjusted: Please take all your medications with you for your next visit with your Primary MD  Laboratory/radiological data: Please request your Primary MD to go over all hospital tests and procedure/radiological results at the follow up, please ask your Primary MD to get all Hospital records sent to his/her office.  In some cases, they  will be blood work, cultures and biopsy results pending at the time of your discharge. Please request that your primary care M.D. goes through all the records of your hospital data and follows up on these results.  Also Note the following: If you experience worsening of your admission symptoms, develop shortness of breath, life threatening emergency, suicidal or homicidal thoughts you must seek medical attention immediately by calling 911 or calling your MD immediately  if symptoms less  severe.  You must read complete instructions/literature along with all the possible adverse reactions/side effects for all the Medicines you take and that have been prescribed to you. Take any new Medicines after you have completely understood and accpet all the possible adverse reactions/side effects.   Do not drive when taking Pain medications or sleeping medications (Benzodaizepines)  Do not take more than prescribed Pain, Sleep and Anxiety Medications. It is not advisable to combine anxiety,sleep and pain medications without talking with your primary care practitioner  Special Instructions: If you have smoked or chewed Tobacco  in the last 2 yrs please stop smoking, stop any regular Alcohol  and or any Recreational drug use.  Wear Seat belts while driving.  Please note: You were cared for by a hospitalist during your hospital stay. Once you are discharged, your primary care physician will handle any further medical issues. Please note that NO REFILLS for any discharge medications will be authorized once you are discharged, as it is imperative that you return to your primary care physician (or establish a relationship with a primary care physician if you do not have one) for your post hospital discharge needs so that they can reassess your need for medications and monitor your lab values.  Total Time spent coordinating discharge including counseling, education and face to face time equals greater than 30 minutes.  SignedBETHA Donalda Applebaum 05/18/2024 9:40 AM

## 2024-05-18 NOTE — TOC Transition Note (Addendum)
 Transition of Care [facility]) - Discharge Note   Patient Details  Name: Maximilian Tallo MRN: [id number] Date of Birth: 03-28-1951  Transition of Care [facility]) CM/SW Contact:  Marval Gell, RN Phone Number: 05/18/2024, 9:49 AM   Clinical Narrative:     Called wife, no answer.  Spoke w patient at bedside.  Patient confirmed he is active with [facility]. Informed him I have a left a message with them to notify of DC. Instructed patient to call his [facility] provider and set up next home visit. No DME needs. No other ICM needs identified.  Wife will transport home   12:46 Requesting hoyer lift, order placed, it will be delivered to the home by Tuesday through [employer]. Nurse to inform patient.   13:25 Nurse states patient tells her he already has a hoyer at home, order cancelled with [employer]   Final next level of care: Home w Home Health Services Barriers to Discharge: No Barriers Identified   Patient Goals and CMS Choice Patient states their goals for this hospitalization and ongoing recovery are:: to go home          Discharge Placement                       Discharge Plan and Services Additional resources added to the After Visit Summary for                  DME Arranged: N/A           HH Agency: [facility] Date [facility] Agency Contacted: 05/18/24 Time HH Agency Contacted: [phone] Representative spoke with at [facility] Agency: [place]  Social Drivers of Health (SDOH) Interventions SDOH Screenings   Food Insecurity: Patient Declined (05/17/2024)  Housing: Unknown (05/17/2024)  Transportation Needs: Patient Declined (05/17/2024)  Utilities: Patient Declined (05/17/2024)  Social Connections: Patient Declined (05/17/2024)  Tobacco Use: Medium Risk (05/16/2024)     Readmission Risk Interventions     No data to display

## 2024-05-18 NOTE — Progress Notes (Signed)
 After looking in care everywhere, he did have a urine culture with e.coli that is pan sens except amp/unasyn/septra. D/w Ghimire, we will change cefepime  to ceftin  500mg  BID to complete 7d.  Sergio Batch, PharmD, BCIDP, AAHIVP, CPP Infectious Disease Pharmacist 05/18/2024 8:31 AM

## 2024-05-20 NOTE — Progress Notes (Signed)
 [facility] Transitional Care Management     Situation   Anthony Mueller is a 73 y.o. male who was contacted today for a transitional care outreach.  Admission Date: 05/14/24   Discharge Date 05/18/24    Institution: [facility]  Diagnosis:    Sepsis, unspecified organism, hyperkalemia, AMS, acute cystitis without hematuria, suprapubic catheter, elevated lactic acid level, hypertension, unspecified type, DM without complication    Is this visit eligible for TCM? Yes  Background   05/20/24 Initial TCM outreach x 1st attempt. HN called patient home # [phone] and spoke with patient's wife, Anthony Mueller, who is listed on hipaa form.    Since Discharge: HN received notification that patient was discharged from [facility] on 05/18/24 for   Sepsis, unspecified organism, hyperkalemia, AMS, acute cystitis without hematuria, suprapubic catheter, elevated lactic acid level, hypertension, unspecified type, DM without complication.  HN spoke with patient's wife, Anthony Mueller, who is listed on HIPAA form, briefly.  Anthony Mueller reports that she feels like he was released too soon. She reports that on Saturday he was still lucid. She reports that he is better today but weak.  She reports they got there about 2:00 pm and didn't get into a room till late that evening. HN notified her if she gets a survey to fill out and put in complaints to the hospital.  She reports that patient is still sleeping a lot and weak. She reports they were fixing to get him on the toilet.  She reports that patient has home instead worker that comes in and is there now to help with him.  She reports that she is going to make patient an apt with [facility] to follow up about the UTI. She reports the doctor said that he had a lot of different bacteria in urine and treating him with broad spectrum antibiotic which he is taking.  She denies patient is having any headache, dizziness, coughing, congestion, fever, chills, chest pain or palpitations or  shortness of breath.  She reports his appetite is about the same. He doesn't eat like he use too. HN encouraged her to make sure he is eating small meals through out the day and drinking plenty of fluids.  She reports he has had BM since been home. He has suprapubic cath and urine is dark.  HN was going over medicines with patient wife, and she reports he is taking his medicines like the hospital said per discharge summary.  HN went over the paused medicines and she said she will still be giving patient his medicines. She reports he has to have the spironolactone due to it will affect him urinating.  She reports  patient has an apt on Wednesday 7/23 and they will follow up with him.  HN offered to make an apt with PCP office, but she reports patient sees his specialist and will keep patient apt in September with Dr. Magdaline.  HN will follow up over the next 30 days.  HN notified her would call and check on him again.    I reviewed current EMR med list and facility discharge medications. Please see note in EMR med list for findings.    New medicine: Ceftin  500 mg 1 po bid  Changed medicines: Coreg  12.5 mg take 1/2 tablet 6.25 mg bid Lasix  40 mg 1 every Monday, W,Wednesday, Friday and Saturday   Paused Medicines:   Losartan  100 mg every day Spironolactone 25 mg take 1 4 x a week Monday, Wednesday, Friday and  Saturday   Primary Care Provider on Record: Debby CHRISTELLA Blanch, MD   Assessment    General Assessment     Type of Visit Telephone   Assessment Completed With Patient   Interpreter Used No   Preferred Language English   Living Arrangement Spouse   Support System Spouse; Home care staff   Home Care Services Yes * Home Health was ordered per hospital note   Does Patient Have DME Yes   DME List Wheelchair   Bed or Wheelchair Confined Yes * wheelchair confined      Flowsheet Row Most Recent Value  Engagement   Call number 1  Two calls made/Contact successful Yes  Is the patient  eligible? Yes [facility], see chart review for hospital discharge summary]  Call Start Time [phone]  Medications   Appointments   Self Management   Patient Teaching   Wrap Up   Two calls made/Contact successful Yes   List of Assessments:  General Assessment:  What type of visit: Telephone   Assessment completed with: Patient Was an interpreter used?: No Language Preferred: English   Living arrangement:: Spouse Support system:: Spouse;Home care staff   Home care services:: Yes (Home Health was ordered per hospital note) Does the patient have DME: Yes DME list: Wheelchair     Bed or wheelchair confined:: Yes (wheelchair confined)       Recommendation    PCP/specialist notified: Yes  Referral Made: No  Referrals made to other disciplines: None   Future Appointments  Date Time Provider Department Center  05/22/2024  1:20 PM Eulas Winnifred Manes, MD [facility] NEP PRE [facility]  07/09/2024 11:15 AM Debby CHRISTELLA Blanch, MD [facility] DEEP [facility] [address]  01/07/2025  1:20 PM [address] [place], PA-C [facility] PUL [facility] [place]    Jon Sharps, RN [facility] [facility] [phone]     Electronically signed by: Jon Earnie Sharps, RN 05/20/2024 9:50 AM    *Some images could not be shown.

## 2024-05-21 ENCOUNTER — Ambulatory Visit

## 2024-05-21 VITALS — BP 92/58 | HR 56 | Ht 73.0 in | Wt 280.0 lb

## 2024-05-21 DIAGNOSIS — I4819 Other persistent atrial fibrillation: Secondary | ICD-10-CM

## 2024-05-21 DIAGNOSIS — E782 Mixed hyperlipidemia: Secondary | ICD-10-CM | POA: Diagnosis not present

## 2024-05-21 DIAGNOSIS — I5042 Chronic combined systolic (congestive) and diastolic (congestive) heart failure: Secondary | ICD-10-CM

## 2024-05-21 DIAGNOSIS — I2510 Atherosclerotic heart disease of native coronary artery without angina pectoris: Secondary | ICD-10-CM

## 2024-05-21 LAB — CULTURE, BLOOD (ROUTINE X 2)
Culture: NO GROWTH
Culture: NO GROWTH
Special Requests: ADEQUATE

## 2024-05-21 MED ORDER — CLONIDINE HCL 0.3 MG PO TABS
0.3000 mg | ORAL_TABLET | Freq: Two times a day (BID) | ORAL | 3 refills | Status: AC
Start: 2024-05-21 — End: ?

## 2024-05-21 MED ORDER — HYDRALAZINE HCL 25 MG PO TABS: 25.0000 mg | ORAL_TABLET | Freq: Three times a day (TID) | ORAL | 3 refills | Status: DC

## 2024-05-21 NOTE — Assessment & Plan Note (Signed)
 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  Continue with salt restriction to below 2 g/day and fluid restriction below 2 L/day. Continue with furosemide  40 mg 4 times a week.  Guideline directed medical therapy limited due to blood pressures and renal function. Continue carvedilol  12.5 mg twice daily. Off losartan  and spironolactone since his admission 05/16/2024 to the hospital for UTI in the setting of renal function and hyperkalemia. Continue hydralazine , dose lowered to 25 mg 3 times daily. Continue Isordil  10 mg 3 times daily. Not a candidate for SGLT2 inhibitors given his mobility, suprapubic catheter and UTIs.  Reduce the dose of clonidine  to 0.3 mg twice daily.

## 2024-05-21 NOTE — Assessment & Plan Note (Addendum)
  Continue lovastatin 40 mg. Direct LDL level 45 on-03/05/2024.

## 2024-05-21 NOTE — Assessment & Plan Note (Addendum)
 Multivessel disease. Workup for CAD with cardiac CT 04/09/2024 in the setting of cardiomyopathy showed multivessel disease. Cardiac cath completed 05/07/2024 multivessel disease no targets for revascularization.  Continue with medical therapy.  Continue with aspirin  81 mg once daily. Continue lovastatin 40 mg.  No active anginal symptoms. Remains on carvedilol  12.5 mg twice daily tolerating well. Has not required sublingual nitroglycerin .

## 2024-05-21 NOTE — Assessment & Plan Note (Signed)
 Persistent atrial fibrillation.  Not on anticoagulation due to prior bleeding complications. Continue with rate control. Remains on carvedilol  12.5 mg twice daily tolerating well.

## 2024-05-21 NOTE — Progress Notes (Signed)
 Cardiology Consultation:    Date:  05/21/2024   ID:  Anthony Mueller, Anthony Mueller 10/04/1951, MRN [id number]  PCP:  Magdaline Debby HERO, MD  Cardiologist:  Alean SAUNDERS Urie Loughner, MD   Referring MD: Magdaline Debby HERO, MD   No chief complaint on file.    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  CAD workup in context of mild cardiomyopathy was pursued noted with severe triple-vessel disease on cardiac CT June 2025 with hemodynamically significant lesion in mid LAD and CTO of distal LCx.  Cardiac cath scheduled and completed 05/07/2024 noted multivessel disease but no obvious targets for revascularization.   Now here for follow-up, post cath he had admission to the hospital for urinary tract infections and had been taken off of losartan  and spironolactone.  Problem List Items Addressed This Visit     Hyperlipidemia    Continue lovastatin 40 mg. Direct LDL level 45 on-03/05/2024.      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  Continue with salt restriction to below 2 g/day and fluid restriction  below 2 L/day. Continue with furosemide  40 mg 4 times a week.  Guideline directed medical therapy limited due to blood pressures and renal function. Continue carvedilol  12.5 mg twice daily. Off losartan  and spironolactone since his admission 05/16/2024 to the hospital for UTI in the setting of renal function and hyperkalemia. Continue hydralazine , dose lowered to 25 mg 3 times daily. Continue Isordil  10 mg 3 times daily. Not a candidate for SGLT2 inhibitors given his mobility, suprapubic catheter and UTIs.  Reduce the dose of clonidine  to 0.3 mg twice daily.       Relevant Medications   carvedilol  (COREG ) 12.5 MG tablet   cloNIDine  (CATAPRES ) 0.3 MG tablet   hydrALAZINE  (APRESOLINE ) 25 MG tablet   Persistent atrial fibrillation (HCC)   Persistent atrial fibrillation.  Not on anticoagulation due to prior bleeding complications. Continue with rate control. Remains on carvedilol  12.5 mg twice daily tolerating well.       Relevant Medications   carvedilol  (COREG ) 12.5 MG tablet   cloNIDine  (CATAPRES ) 0.3 MG tablet   hydrALAZINE  (APRESOLINE ) 25 MG tablet   CAD (coronary artery disease) - Primary   Multivessel disease. Workup for CAD with cardiac CT 04/09/2024 in the setting of cardiomyopathy showed multivessel disease. Cardiac cath completed 05/07/2024 multivessel disease no targets for revascularization.  Continue with medical therapy.  Continue with aspirin  81 mg once daily. Continue lovastatin 40 mg.  No active anginal symptoms. Remains on carvedilol  12.5 mg twice daily tolerating well. Has not required sublingual nitroglycerin .      Relevant Medications   carvedilol  (COREG ) 12.5 MG tablet   cloNIDine  (CATAPRES ) 0.3 MG tablet   hydrALAZINE  (APRESOLINE ) 25  MG tablet    Return to clinic in 3 months.  History of Present Illness:    Anthony Mueller is a 73 y.o. male who is being seen today for follow-up visit. PCP is Magdaline Debby HERO, MD. Last visit with me in the  office was 04/18/2024.  Accompanied by the visit by his wife.  Who helps manage his medications.  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  CAD workup in context of mild cardiomyopathy was pursued noted with severe triple-vessel disease on cardiac CT June 2025 with hemodynamically significant lesion in mid LAD and CTO of distal LCx.  Cardiac cath scheduled and completed 05/07/2024 noted multivessel disease but no obvious targets for revascularization.  He was subsequently admitted and discharged from hospital from 05/16/2024 through 05/18/2024 in the setting of sepsis secondary to urinary tract infection.  Noted to have low heart rates in A-fib and dose of beta-blocker was reduced.  Losartan , Aldactone was discontinued given renal function and hyperkalemia during inpatient stay. Here for follow-up visit.  Continues to be on antibiotic regimen for another 7 days.  Blood pressures at home reviewed well-controlled systolic 100- 110  mmHg. Denies any chest pain or shortness of breath. Mentions his confusion and memory is slowly improving. Denies any palpitations. Does report he is feeling tired. His wife notes he does get confused but this has been improving over the past couple days.   Past Medical History:  Diagnosis Date   Accelerated hypertension 03/31/2022   Acute diastolic (congestive) heart failure (HCC) 03/31/2022   Acute on chronic anemia 01/13/2022   AKI (acute kidney injury) (HCC) 01/13/2022    At risk for fall due to comorbid condition 01/20/2022   B12 deficiency 03/01/2022   Bilateral lower extremity edema 01/13/2022   CAD (coronary artery disease) 04/18/2024   Calculus of gallbladder with acute cholecystitis without obstruction 02/28/2022   Cardiomegaly 11/10/2021   Catheter-associated urinary tract infection (HCC) 05/17/2024   Cerebellar degeneration (HCC)    CHF (congestive heart failure) (HCC) 03/31/2022   CHF exacerbation (HCC) 03/31/2022   Cholelithiasis 11/10/2021   Chronic diastolic heart failure (HCC) 03/31/2022   Chronic kidney disease, stage 3a (HCC) 12/28/2023   11/22/2023 Nephrology OV note.     Chronic suprapubic catheter (HCC) 12/08/2022   [facility]     Diabetes mellitus without complication (HCC)    DM type 2 with diabetic mixed hyperlipidemia (HCC) 03/04/2020   Does not transfer from wheelchair to toilet 01/20/2022   Drug therapy 02/02/2016   Elevated antinuclear antibody (ANA) level 10/06/2015   Frail elderly 04/07/2022   Gait disorder 09/07/2016   GERD (gastroesophageal reflux disease)    Gout 08/28/2012   History of 2019 novel coronavirus disease (COVID-19) 10/27/2021   History of colonic polyps 11/14/2011   History of esophageal stricture 05/02/2018   Hypercholesteremia 03/31/2022   Hypercoagulable state due to persistent atrial fibrillation (HCC) 12/16/2021   Hyperlipidemia    Hypertension    Hypertension, essential, benign 08/28/2012   Hypertensive heart disease 01/05/2022   Hypertensive heart disease with acute on chronic diastolic congestive heart failure (HCC) 03/31/2022  Hypertensive urgency 03/31/2022   Hypokalemia 11/10/2021   Hypomagnesemia 01/13/2022   Intertrigo 09/03/2018   Iron  deficiency anemia due to chronic blood loss 03/01/2022   Long term (current) use of anticoagulants 12/16/2021   Malaise and fatigue 03/31/2022   Muscular deconditioning 12/23/2021   Obesity (BMI 30-39.9) 03/15/2023   Occult GI bleeding  11/06/2017   OSA (obstructive sleep apnea) 01/13/2022   Formatting of this note might be different from the original. WEARS CPAP   Persistent atrial fibrillation (HCC) 11/13/2021   Formatting of this note might be different from the original. Last Assessment & Plan:  Formatting of this note might be different from the original. - Patient noted to be in atrial fibrillation, bradycardia with sinus pauses -Per cardiology, started on apixaban , will need to be stopped prior to his upcoming rectal surgery (stop 5 days prior to the surgery and then resume once cleared by surgery se   Positive ANA (antinuclear antibody) 10/06/2015   Postoperative anemia due to acute blood loss 01/20/2022   Postoperative examination 02/28/2022   Pulmonary edema 03/31/2022   Rectal bleeding 01/14/2022   RLS (restless legs syndrome) 09/08/2016   Shortness of breath 03/31/2022   Skin cancer    Transaminitis 11/10/2021   Trigger middle finger of left hand 10/07/2015   Type 2 diabetes mellitus with hyperlipidemia (HCC) 11/10/2021   Uninhibited neuropathic bladder, 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   Unspecified atrial fibrillation (HCC) with sinus pauses 11/13/2021   Vitamin D deficiency due to chronic kidney disease 12/29/2023    Past Surgical History:  Procedure Laterality Date   APPENDECTOMY     CATARACT EXTRACTION, BILATERAL     CHOLECYSTECTOMY     HEMORRHOID SURGERY     IR CATHETER TUBE CHANGE  06/06/2022   LEFT HEART CATH AND CORONARY ANGIOGRAPHY N/A 05/07/2024   Procedure: LEFT HEART CATH AND CORONARY ANGIOGRAPHY;  Surgeon: Anner Alm ORN, MD;  Location[facility] INVASIVE CV LAB;  Service: Cardiovascular;  Laterality: N/A;   POLYPECTOMY     ULNAR NERVE REPAIR Left     Current Medications: Current Meds  Medication Sig   albuterol  (VENTOLIN  HFA) 108 (90 Base) MCG/ACT inhaler Inhale 2 puffs into the lungs every 6 (six) hours as needed for wheezing or shortness of breath.   allopurinol  (ZYLOPRIM ) 100 MG tablet  Take 100 mg by mouth 2 (two) times daily.   Ascorbic Acid  (VITAMIN C) 1000 MG tablet Take 1,000 mg by mouth daily.   aspirin  EC 81 MG tablet Take 81 mg by mouth daily.   carvedilol  (COREG ) 12.5 MG tablet Take 12.5 mg by mouth 2 (two) times daily with a meal.   cefUROXime  (CEFTIN ) 500 MG tablet Take 1 tablet (500 mg total) by mouth 2 (two) times daily with a meal for 10 doses.   cyanocobalamin  (VITAMIN B12) 1000 MCG/ML injection Inject 1,000 mcg into the muscle every 30 (thirty) days.   ferrous sulfate  ER (IRON  SLOW RELEASE) 142 (45 Fe) MG TBCR tablet Take 45 mg by mouth every evening.   furosemide  (LASIX ) 40 MG tablet Take 1 tablet (40 mg total) by mouth every Monday, Wednesday, Friday and Saturday (Patient taking differently: Take 40 mg by mouth as directed. Every Monday, Wednesday, Friday, and Saturday)   isosorbide  dinitrate (ISORDIL ) 10 MG tablet Take 10 mg by mouth 3 (three) times daily.   lovastatin (MEVACOR) 40 MG tablet Take 40 mg by mouth every evening.   Magnesium  Oxide -Mg Supplement 400 MG CAPS Take 800 mg by mouth daily. (Patient  taking differently: Take 400 mg by mouth 2 (two) times daily.)   metFORMIN (GLUCOPHAGE) 500 MG tablet Take 1,000 mg by mouth 2 (two) times daily.   nitroGLYCERIN  (NITROSTAT ) 0.4 MG SL tablet Place 1 tablet (0.4 mg total) under the tongue every 5 (five) minutes as needed.   nystatin (MYCOSTATIN/NYSTOP) powder Apply 1 Application topically daily as needed (rash).   omeprazole (PRILOSEC) 20 MG capsule Take 20 mg by mouth in the morning and at bedtime.   oxaprozin (DAYPRO) 600 MG tablet Take 1,200 mg by mouth daily as needed (Gout).   timolol  (TIMOPTIC ) 0.5 % ophthalmic solution Place 1 drop into both eyes daily.   TRADJENTA  5 MG TABS tablet Take 5 mg by mouth daily.   Vitamin D, Ergocalciferol, (DRISDOL) 1.25 MG (50000 UNIT) CAPS capsule Take 50,000 Units by mouth every Sunday.   zinc  gluconate 50 MG tablet Take 50 mg by mouth daily.   [DISCONTINUED] carvedilol   (COREG ) 12.5 MG tablet Take 1/2 tablets (6.25 mg total) by mouth 2 (two) times daily.   [DISCONTINUED] cloNIDine  (CATAPRES ) 0.3 MG tablet Take 0.3 mg by mouth 3 (three) times daily.   [DISCONTINUED] hydrALAZINE  (APRESOLINE ) 25 MG tablet Take 50 mg by mouth in the morning, at noon, and at bedtime.   [DISCONTINUED] spironolactone (ALDACTONE) 25 MG tablet Take 25 mg by mouth 4 (four) times a week. Mon, Wed, Fri, and Sat     Allergies:   Victoza [liraglutide], Hydrocodone, Other, Tape, and Metronidazole   Social History   Socioeconomic History   Marital status: Married    Spouse name: BARBARA   Number of children: 2   Years of education: 12 + 4   Highest education level: Not on file  Occupational History   Occupation: RETIRED - [employer] SUPERVISOR  Tobacco Use   Smoking status: Former    Types: Cigarettes    Passive exposure: Never   Smokeless tobacco: Never  Vaping Use   Vaping status: Never Used  Substance and Sexual Activity   Alcohol use: Yes    Comment: RARELY   Drug use: Never   Sexual activity: Not Currently  Other Topics Concern   Not on file  Social History Narrative   Not on file   Social Drivers of Health   Financial Resource Strain: Not on file  Food Insecurity: Patient Declined (05/17/2024)   Hunger Vital Sign    Worried About Running Out of Food in the Last Year: Patient declined    Ran Out of Food in the Last Year: Patient declined  Transportation Needs: Patient Declined (05/17/2024)   PRAPARE - Administrator, Civil Service (Medical): Patient declined    Lack of Transportation (Non-Medical): Patient declined  Physical Activity: Not on file  Stress: Not on file  Social Connections: Patient Declined (05/17/2024)   Social Connection and Isolation Panel    Frequency of Communication with Friends and Family: Patient declined    Frequency of Social Gatherings with Friends and Family: Patient declined    Attends Religious Services: Patient  declined    Database administrator or Organizations: Patient declined    Attends Banker Meetings: Patient declined    Marital Status: Patient declined     Family History: The patient's family history includes Colon cancer in his father; Diabetes in his mother; Healthy in his sister; Hyperlipidemia in his mother; Hypertension in his mother; Stroke in his mother. ROS:   Please see the history of present illness.    All  14 point review of systems negative except as described per history of present illness.  EKGs/Labs/Other Studies Reviewed:    The following studies were reviewed today: Cardiac Studies & Procedures   ______________________________________________________________________________________________ CARDIAC CATHETERIZATION  CARDIAC CATHETERIZATION 05/07/2024  Conclusion Images from the original result were not included.    LPAV lesion is 90% stenosed with 100% stenosed side branch in 1st LPL.   2nd Mrg lesion is 35% stenosed.   RPAV lesion is 75% stenosed.   Mid LAD to Dist LAD lesion is 50% stenosed.   1st Diag lesion is 100% stenosed.   LV end diastolic pressure is normal.  There is no aortic valve stenosis.  Dominance: Right  FINDINGS Multiple vessel CAD but no good PCI target: -- Native RCA has mild diffuse proximal disease with a large PDA. The small PLV branch has a ostial 70 to 80% stenosis that is relatively small in caliber and not favorable for PCI based on the ostial location - Native LCx is a large-caliber vessel that has a small OM branch but bifurcates into basically OM 2 and the AV groove branch that terminates as an LPL 1. There is 30% disease in the OM1, but 90% ostial AV groove LCx that is occluded just at the takeoff of LPL 1. The LPL is a large vessel that fills via retrograde filling almost always to the occlusion site roughly 30 mm occlusion. - The LAD starts as a large-caliber vessel was extensively calcified. There is a small caliber  first diagonal branch that is focally occluded and again fills retrograde with 2 branches. The LAD tapers to about a 50 or 60% stenosis distally after giving rise to a small second diagonal branch. No obvious culprit lesion to explain the CT FFR positive findings. Normal LVEDP Very tortuous innominate artery making radial catheterization difficult.   RECOMMENDATIONS   In the absence of any other complications or medical issues, we expect the patient to be ready for discharge from a cath perspective on 05/07/2024.   Recommend aggressive risk factor modification.  Unlikely to PCI of either of the CTO vessels would be of any benefit, and a small PAD branch is not likely causing significant symptoms-would be an ostial lesion in not favorable for stent placement.   Recommend Aspirin  81mg  daily for moderate CAD.   [name], MD  Findings Coronary Findings Diagnostic  Dominance: Right  Left Main Vessel was injected. Vessel is large.  Left Anterior Descending Vessel is large. There is mild diffuse disease throughout the vessel. The vessel is moderately calcified. Mid LAD to Dist LAD lesion is 50% stenosed.  First Diagonal Branch Vessel is small in size. Collaterals 1st Diag filled by collaterals from 2nd Diag.  1st Diag lesion is 100% stenosed. The lesion is chronically occluded.  Second Diagonal Branch Vessel is small in size. There is moderate disease in the vessel.  Left Circumflex Vessel is large.  First Obtuse Marginal Branch Vessel is small in size.  Second Obtuse Marginal Branch Vessel is large in size. 2nd Mrg lesion is 35% stenosed.  First Left Posterolateral Branch Collaterals 1st LPL filled by collaterals from 2nd Mrg.  Left Posterior Atrioventricular Artery Vessel is large in size. LPAV lesion is 90% stenosed with 100% stenosed side branch in 1st LPL.  Right Coronary Artery Vessel was injected. Vessel is normal in caliber and large. There is mild diffuse  disease throughout the vessel.  Right Ventricular Branch Vessel is small in size.  Right Posterior Descending Artery Vessel is  large in size.  Right Posterior Atrioventricular Artery Vessel is small in size. RPAV lesion is 75% stenosed.  First Right Posterolateral Branch Vessel is small in size.  Intervention  No interventions have been documented.     ECHOCARDIOGRAM  ECHOCARDIOGRAM COMPLETE 11/11/2021  Narrative ECHOCARDIOGRAM REPORT    Patient Name:   ABDULWAHAB DEMELO Date of Exam: 11/11/2021 Medical Rec #:  [id number]      Height:       73.0 in Accession #:    [id number]     Weight:       250.0 lb Date of Birth:  09-Aug-1951      BSA:          2.366 m Patient Age:    70 years       BP:           137/81 mmHg Patient Gender: M              HR:           72 bpm. Exam Location:  Inpatient  Procedure: 2D Echo  Indications:    Cardiomegaly  History:        Patient has no prior history of Echocardiogram examinations. Risk Factors:Diabetes.  Sonographer:    Elida Casey Referring Phys: [id number] RONDELL A SMITH  IMPRESSIONS   1. Left ventricular ejection fraction, by estimation, is 55 to 60%. The left ventricle has normal function. The left ventricle has no regional wall motion abnormalities. There is mild left ventricular hypertrophy. Left ventricular diastolic parameters were normal. 2. Right ventricular systolic function is normal. The right ventricular size is normal. There is mildly elevated pulmonary artery systolic pressure. The estimated right ventricular systolic pressure is 39.6 mmHg. 3. Left atrial size was moderately dilated. 4. Right atrial size was mildly dilated. 5. The mitral valve is normal in structure. Mild mitral valve regurgitation. No evidence of mitral stenosis. 6. The aortic valve is tricuspid. Aortic valve regurgitation is trivial. Aortic valve sclerosis/calcification is present, without any evidence of aortic stenosis. 7. The inferior vena  cava is dilated in size with <50% respiratory variability, suggesting right atrial pressure of 15 mmHg.  FINDINGS Left Ventricle: Left ventricular ejection fraction, by estimation, is 55 to 60%. The left ventricle has normal function. The left ventricle has no regional wall motion abnormalities. The left ventricular internal cavity size was normal in size. There is mild left ventricular hypertrophy. Left ventricular diastolic parameters were normal.  Right Ventricle: The right ventricular size is normal. No increase in right ventricular wall thickness. Right ventricular systolic function is normal. There is mildly elevated pulmonary artery systolic pressure. The tricuspid regurgitant velocity is 2.48 m/s, and with an assumed right atrial pressure of 15 mmHg, the estimated right ventricular systolic pressure is 39.6 mmHg.  Left [facility]: Left atrial size was moderately dilated.  Right [facility]: Right atrial size was mildly dilated.  Pericardium: Trivial pericardial effusion is present.  Mitral Valve: The mitral valve is normal in structure. Mild mitral valve regurgitation. No evidence of mitral valve stenosis.  Tricuspid Valve: The tricuspid valve is normal in structure. Tricuspid valve regurgitation is trivial.  Aortic Valve: The aortic valve is tricuspid. Aortic valve regurgitation is trivial. Aortic valve sclerosis/calcification is present, without any evidence of aortic stenosis. Aortic valve peak gradient measures 6.1 mmHg.  Pulmonic Valve: The pulmonic valve was normal in structure. Pulmonic valve regurgitation is not visualized.  Aorta: The aortic root is normal in size and structure.  Venous: The inferior vena cava  is dilated in size with less than 50% respiratory variability, suggesting right atrial pressure of 15 mmHg.  IAS/Shunts: No atrial level shunt detected by color flow Doppler.   LEFT VENTRICLE PLAX 2D LVIDd:         5.25 cm   Diastology LVIDs:         3.40 cm   LV e'  medial:    8.99 cm/s LV PW:         1.50 cm   LV E/e' medial:  12.0 LV IVS:        1.20 cm   LV e' lateral:   9.43 cm/s LVOT diam:     2.10 cm   LV E/e' lateral: 11.5 LV SV:         92 LV SV Index:   39 LVOT Area:     3.46 cm   RIGHT VENTRICLE             IVC RV Basal diam:  3.50 cm     IVC diam: 2.60 cm RV S prime:     10.30 cm/s TAPSE (M-mode): 1.6 cm  LEFT ATRIUM              Index        RIGHT ATRIUM           Index LA diam:        4.10 cm  1.73 cm/m   RA Area:     20.00 cm LA Vol (A2C):   114.0 ml 48.19 ml/m  RA Volume:   57.10 ml  24.14 ml/m LA Vol (A4C):   95.1 ml  40.20 ml/m LA Biplane Vol: 106.0 ml 44.81 ml/m AORTIC VALVE                 PULMONIC VALVE AV Area (Vmax): 3.25 cm     PV Vmax:       0.74 m/s AV Vmax:        123.50 cm/s  PV Peak grad:  2.2 mmHg AV Peak Grad:   6.1 mmHg LVOT Vmax:      116.00 cm/s LVOT Vmean:     76.700 cm/s LVOT VTI:       0.266 m  AORTA Ao Root diam: 3.70 cm Ao Asc diam:  3.30 cm  MITRAL VALVE                TRICUSPID VALVE MV Area (PHT): 7.82 cm     TR Peak grad:   24.6 mmHg MV Decel Time: 97 msec      TR Vmax:        248.00 cm/s MR Peak grad: 95.6 mmHg MR Vmax:      489.00 cm/s   SHUNTS MV E velocity: 108.00 cm/s  Systemic VTI:  0.27 m MV A velocity: 82.70 cm/s   Systemic Diam: 2.10 cm MV E/A ratio:  1.31  Dalton McleanMD Electronically signed by Ezra Kanner Signature Date/Time: 11/11/2021/5:04:34 PM    Final    MONITORS  LONG TERM MONITOR (3-14 DAYS) 01/20/2022  Narrative Patch Wear Time:  7 days and 16 hours (2023-03-08T14:39:55-0500 to 2023-03-16T08:07:36-0400)  Atrial Fibrillation occurred continuously (100% burden), ranging from 40-134 bpm (avg of 76 bpm).  Good heart rate control is seen with ventricular rate 50 to 110 bpm 99% of the time during the day and 98% at night.  There were no pauses of 3 seconds or greater  Isolated VEs were rare (<1.0%), VE Couplets were rare (<1.0%), and no VE Triplets  were  present.  There were no symptomatic or diary events   CT SCANS  CT CORONARY MORPH W/CTA COR W/SCORE 04/09/2024  Addendum 04/12/2024  9:00 PM ADDENDUM REPORT: 04/12/2024 20:58  EXAM: OVER-READ INTERPRETATION  CT CHEST  The following report is an over-read performed by radiologist Dr. Fonda Mom [facility] on 04/12/2024. This over-read does not include interpretation of cardiac or coronary anatomy or pathology. The coronary CTA interpretation by the cardiologist is attached.  COMPARISON:  02/04/2024.  FINDINGS: Cardiovascular:  See findings discussed in the body of the report.  Mediastinum/Nodes: No suspicious adenopathy identified. Imaged mediastinal structures are unremarkable.  Lungs/Pleura: There is dependent basilar subsegmental atelectasis. No pneumonia or pulmonary edema. No pleural effusion or pneumothorax.  Upper Abdomen: Large paraesophageal hiatal hernia.  Musculoskeletal: No chest wall abnormality. No acute osseous findings.  IMPRESSION: 1. Large paraesophageal hiatal hernia. 2. Dependent basilar subsegmental atelectasis.   Electronically Signed By: Fonda Field M.D. On: 04/12/2024 20:58  Narrative CLINICAL DATA:  CP  EXAM: Cardiac/Coronary  CTA  TECHNIQUE: The patient was scanned on a GE Apex scanner.  FINDINGS: A 120 kV prospective scan was triggered in the descending thoracic aorta at 111 HU's. Axial non-contrast 3 mm slices were carried out through the heart. The data set was analyzed on a dedicated work station and scored using the Agatson method. Gantry rotation speed was 250 msecs and collimation was .6 mm. No beta blockade and 0.8 mg of sl NTG was given. The 3D data set was reconstructed at 75% of the R-R cycle. Diastolic phases were analyzed on a dedicated work station using MPR, MIP and VRT modes. The patient received 80 cc of contrast.  Aorta: Normal size.  No calcifications.  No dissection.  Aortic Valve:   Trileaflet.  No calcifications.  Coronary Arteries:  Normal coronary origin.  Right dominance.  RCA is a large dominant artery that gives rise to PDA and PLA. Diffusely diseased with multiple mostly calcified plaques with moderate stenosis of 50-69% in the proximal and mid portion.  Left main is a large, short artery that gives rise to LAD and LCX arteries.  LAD is a large vessel that is diffusely diseased with numerous mostly calcified plaques. The most severe stenosis is located in the mid portion of this artery with severe stenosis of 70-99%. This artery gives rise to moderate size D1. D1 is also diffusely diseased with numerous calcified plaques with at least mild stenosis of 25-49%.  LCX is a non-dominant artery that gives rise to one large OM1 branch. This artery is also diffusely diseased with multiple calcified plaques with moderate stenosis 50-69% noted in OM1 branch.  Other findings:  Normal pulmonary vein drainage into the left atrium.  Normal left atrial appendage without a thrombus.  Normal size of the pulmonary artery.  IMPRESSION: 1. Coronary calcium  score of 3141. This was 95 percentile for age and sex matched control.  2. Normal coronary origin with right dominance.  3. CAD-RADS 4 Severe stenosis. (70-99%) - mid LAD. Moderate stenosis of CX and RCA. Cardiac catheterization or CT FFR is recommended. Consider symptom-guided anti-ischemic pharmacotherapy as well as risk factor modification per guideline directed care. TVD.  4. Plaque analysis: TPV - 2442 mm3 - 100 percentile, calcified plaque 680 mm3, non calcified plaque 1762 mm3. Extensive TPV.  Electronically Signed: By: Lamar Fitch M.D. On: 04/11/2024 21:04     ______________________________________________________________________________________________      EKG:       Recent Labs: 03/21/2024: Magnesium   1.5 05/16/2024: ALT 14 05/17/2024: B Natriuretic Peptide 295.5 05/18/2024: BUN 25;  Creatinine, Ser 1.64; Hemoglobin 12.4; Platelets 210; Potassium 4.3; Sodium 138; TSH 10.149  Recent Lipid Panel    Component Value Date/Time   LDLDIRECT 40 03/05/2024 1442    Physical Exam:    VS:  BP (!) 92/58   Pulse (!) 56   Ht 6' 1 (1.854 m)   Wt 280 lb (127 kg)   SpO2 93%   BMI 36.94 kg/m     Wt Readings from Last 3 Encounters:  05/21/24 280 lb (127 kg)  05/16/24 280 lb (127 kg)  05/07/24 285 lb (129.3 kg)     GENERAL:  Well nourished, well developed in no acute distress. Limited to his wheelchair and requires full assistance. NECK: No JVD; No carotid bruits CARDIAC: RRR, S1 and S2 present, no murmurs, no rubs, no gallops CHEST:  Clear to auscultation without rales, wheezing or rhonchi  Extremities: 1+ bilateral pitting pedal edema. Pulses bilaterally symmetric with radial 2+ and dorsalis pedis 2+ NEUROLOGIC:  Alert and oriented x 3  Medication Adjustments/Labs and Tests Ordered: Current medicines are reviewed at length with the patient today.  Concerns regarding medicines are outlined above.  No orders of the defined types were placed in this encounter.  Meds ordered this encounter  Medications   cloNIDine  (CATAPRES ) 0.3 MG tablet    Sig: Take 1 tablet (0.3 mg total) by mouth 2 (two) times daily.    Dispense:  180 tablet    Refill:  3   hydrALAZINE  (APRESOLINE ) 25 MG tablet    Sig: Take 1 tablet (25 mg total) by mouth 3 (three) times daily.    Dispense:  270 tablet    Refill:  3    Signed, Lynessa Almanzar reddy Cyrilla Durkin, MD, MPH, [facility]. 05/21/2024 1:51 PM    [facility]

## 2024-05-21 NOTE — Patient Instructions (Addendum)
 Medication Instructions:  Your physician has recommended you make the following change in your medication:   Decrease your Clonidine  to twice daily  Decrease your Hydralazine  to 25 mg three times daily  *If you need a refill on your cardiac medications before your next appointment, please call your pharmacy*   Lab Work: None ordered If you have labs (blood work) drawn today and your tests are completely normal, you will receive your results only by: MyChart Message (if you have MyChart) OR A paper copy in the mail If you have any lab test that is abnormal or we need to change your treatment, we will call you to review the results.   Testing/Procedures: None ordered   Follow-Up: At [facility], you and your health needs are our priority.  As part of our continuing mission to provide you with exceptional heart care, we have created designated Provider Care Teams.  These Care Teams include your primary Cardiologist (physician) and Advanced Practice Providers (APPs -  Physician Assistants and Nurse Practitioners) who all work together to provide you with the care you need, when you need it.  We recommend signing up for the patient portal called MyChart.  Sign up information is provided on this After Visit Summary.  MyChart is used to connect with patients for Virtual Visits (Telemedicine).  Patients are able to view lab/test results, encounter notes, upcoming appointments, etc.  Non-urgent messages can be sent to your provider as well.   To learn more about what you can do with MyChart, go to [email or web address].    Your next appointment:   3 month(s)  The format for your next appointment:   In Person  Provider:   Jennifer Crape, MD    Other Instructions none  Important Information About Sugar

## 2024-07-05 ENCOUNTER — Other Ambulatory Visit (HOSPITAL_COMMUNITY): Payer: Self-pay

## 2024-07-17 ENCOUNTER — Other Ambulatory Visit: Payer: Self-pay

## 2024-07-17 NOTE — Progress Notes (Unsigned)
 [facility]  [address] [place],  [place]  [address] [phone]  Clinic Day:  07/17/2024  Referring physician: Magdaline Debby HERO, MD  HISTORY OF PRESENT ILLNESS:  The patient is a 73 y.o. male  who I recently began seeing for anemia secondary to iron  and B12 deficiency. He has received IV iron , which was effective in bolstering his iron  stores and improving his hemoglobin.   He continues to take monthly B12 injections to fortify his cobalamin stores.  He comes in today to reassess his anemia.  Since his last visit, the patient has been doing okay.  He denies having increased fatigue or any overt forms of blood loss which concern him for progressive anemia.  His daughter is concerned about his weight gain; she believes he has gained over 20-25 pounds over these past few months.  As he has congestive heart failure, the concern is this is all fluid weight gain.  PHYSICAL EXAM:  There were no vitals taken for this visit. Wt Readings from Last 3 Encounters:  05/21/24 280 lb (127 kg)  05/16/24 280 lb (127 kg)  05/07/24 285 lb (129.3 kg)   There is no height or weight on file to calculate BMI. Performance status (ECOG): 2 - Symptomatic, <50% confined to bed Physical Exam Constitutional:      Appearance: Normal appearance. He is not ill-appearing.     Comments: A chronically ill-appearing gentleman who is in a wheelchair.  A Foley catheter is in place  HENT:     Mouth/Throat:     Mouth: Mucous membranes are moist.     Pharynx: Oropharynx is clear. No oropharyngeal exudate or posterior oropharyngeal erythema.  Cardiovascular:     Rate and Rhythm: Normal rate and regular rhythm.     Heart sounds: No murmur heard.    No friction rub. No gallop.  Pulmonary:     Effort: Pulmonary effort is normal. No respiratory distress.     Breath sounds: Normal breath sounds. No wheezing, rhonchi or rales.  Abdominal:     General: Bowel sounds are normal. There is no  distension.     Palpations: Abdomen is soft. There is no mass.     Tenderness: There is no abdominal tenderness.  Musculoskeletal:        General: No swelling.     Right lower leg: No edema.     Left lower leg: No edema.  Lymphadenopathy:     Cervical: No cervical adenopathy.     Upper Body:     Right upper body: No supraclavicular or axillary adenopathy.     Left upper body: No supraclavicular or axillary adenopathy.     Lower Body: No right inguinal adenopathy. No left inguinal adenopathy.  Skin:    General: Skin is warm.     Coloration: Skin is not jaundiced.     Findings: No lesion or rash.  Neurological:     General: No focal deficit present.     Mental Status: He is alert and oriented to person, place, and time. Mental status is at baseline.  Psychiatric:        Mood and Affect: Mood normal.        Behavior: Behavior normal.        Thought Content: Thought content normal.    LABS:    Latest Reference Range & Units 01/15/24 12:52  WBC 4.0 - 10.5 K/uL 14.3 (H)  RBC 4.22 - 5.81 MIL/uL 3.72 (L)  Hemoglobin 13.0 -  17.0 g/dL 88.2 (L)  HCT 60.9 - 47.9 % 35.2 (L)  MCV 80.0 - 100.0 fL 94.6  MCH 26.0 - 34.0 pg 31.5  MCHC 30.0 - 36.0 g/dL 66.7  RDW 88.4 - 84.4 % 13.5  Platelets 150 - 400 K/uL 220  nRBC 0.0 - 0.2 % 0 /100 WBC 0.00 0  Neutrophils % 75  Lymphocytes % 12  Monocytes Relative % 8  Eosinophil % 3  Basophil % 1  Immature Granulocytes % 1  (H): Data is abnormally high (L): Data is abnormally low  Latest Reference Range & Units 01/15/24 12:52  Sodium 135 - 145 mmol/L 138  Potassium 3.5 - 5.1 mmol/L 4.6  Chloride 98 - 111 mmol/L 98  CO2 22 - 32 mmol/L 26  Glucose 70 - 99 mg/dL 763 (H)  BUN 8 - 23 mg/dL 25 (H)  Creatinine 9.38 - 1.24 mg/dL 8.60 (H)  Calcium  8.9 - 10.3 mg/dL 9.6  Anion gap 5 - 15  14  Alkaline Phosphatase 38 - 126 U/L 45  Albumin 3.5 - 5.0 g/dL 4.0  AST 15 - 41 U/L 17  ALT 0 - 44 U/L 15  Total Protein 6.5 - 8.1 g/dL 6.9  Total Bilirubin  0.0 - 1.2 mg/dL 0.3  GFR, Est Non African American >60 mL/min 53 (L)  (H): Data is abnormally high (L): Data is abnormally low   Latest Reference Range & Units 01/15/24 12:52 01/15/24 12:53  Iron  45 - 182 ug/dL  62  UIBC ug/dL  714  TIBC 749 - 549 ug/dL  652  Saturation Ratios 17.9 - 39.5 %  18  Ferritin 24 - 336 ng/mL  77  Folate >5.9 ng/mL 13.2   Vitamin B12 180 - 914 pg/mL 455    ASSESSMENT & PLAN:  A 73 y.o. male with anemia secondary to both iron  deficiency and vitamin B12 deficiency.  His hemoglobin of 11.7 is not much different than what it has been previously.  His iron  and B12 levels remain normal.  He knows to continue taking his monthly B12 injections to ensure adequate fortification of his cobalamin stores.  His labs today also show him with mild renal insufficiency, which is also likely factoring into his anemia.  He is already seeing a nephrologist for this issue.  Otherwise, as his hemoglobin remains adequate, no intervention is necessary at this time.  I will see him back in 6 months for repeat clinical assessment.  The patient understands all the plans discussed today and is in agreement with them.  Ninel Abdella DELENA Kerns, MD

## 2024-07-18 ENCOUNTER — Inpatient Hospital Stay: Admitting: Oncology

## 2024-07-18 ENCOUNTER — Other Ambulatory Visit: Payer: Self-pay | Admitting: Oncology

## 2024-07-18 ENCOUNTER — Inpatient Hospital Stay: Attending: Oncology

## 2024-07-18 VITALS — BP 178/88 | HR 72 | Temp 98.1°F | Resp 16 | Ht 73.0 in | Wt 276.1 lb

## 2024-07-18 DIAGNOSIS — D519 Vitamin B12 deficiency anemia, unspecified: Secondary | ICD-10-CM | POA: Diagnosis present

## 2024-07-18 DIAGNOSIS — D500 Iron deficiency anemia secondary to blood loss (chronic): Secondary | ICD-10-CM

## 2024-07-18 DIAGNOSIS — D649 Anemia, unspecified: Secondary | ICD-10-CM

## 2024-07-18 DIAGNOSIS — N289 Disorder of kidney and ureter, unspecified: Secondary | ICD-10-CM | POA: Insufficient documentation

## 2024-07-18 LAB — CBC WITH DIFFERENTIAL (CANCER CENTER ONLY)
Abs Immature Granulocytes: 0.14 K/uL — ABNORMAL HIGH (ref 0.00–0.07)
Basophils Absolute: 0.1 K/uL (ref 0.0–0.1)
Basophils Relative: 1 %
Eosinophils Absolute: 0.5 K/uL (ref 0.0–0.5)
Eosinophils Relative: 4 %
HCT: 33.9 % — ABNORMAL LOW (ref 39.0–52.0)
Hemoglobin: 11.2 g/dL — ABNORMAL LOW (ref 13.0–17.0)
Immature Granulocytes: 1 %
Lymphocytes Relative: 10 %
Lymphs Abs: 1.5 K/uL (ref 0.7–4.0)
MCH: 31.4 pg (ref 26.0–34.0)
MCHC: 33 g/dL (ref 30.0–36.0)
MCV: 95 fL (ref 80.0–100.0)
Monocytes Absolute: 1.1 K/uL — ABNORMAL HIGH (ref 0.1–1.0)
Monocytes Relative: 8 %
Neutro Abs: 11.3 K/uL — ABNORMAL HIGH (ref 1.7–7.7)
Neutrophils Relative %: 76 %
Platelet Count: 227 K/uL (ref 150–400)
RBC: 3.57 MIL/uL — ABNORMAL LOW (ref 4.22–5.81)
RDW: 13.8 % (ref 11.5–15.5)
WBC Count: 14.6 K/uL — ABNORMAL HIGH (ref 4.0–10.5)
nRBC: 0 % (ref 0.0–0.2)

## 2024-07-18 LAB — VITAMIN B12: Vitamin B-12: 642 pg/mL (ref 180–914)

## 2024-07-18 LAB — CMP (CANCER CENTER ONLY)
ALT: 6 U/L (ref 0–44)
AST: 12 U/L — ABNORMAL LOW (ref 15–41)
Albumin: 3.8 g/dL (ref 3.5–5.0)
Alkaline Phosphatase: 42 U/L (ref 38–126)
Anion gap: 17 — ABNORMAL HIGH (ref 5–15)
BUN: 17 mg/dL (ref 8–23)
CO2: 25 mmol/L (ref 22–32)
Calcium: 9.1 mg/dL (ref 8.9–10.3)
Chloride: 98 mmol/L (ref 98–111)
Creatinine: 1.25 mg/dL — ABNORMAL HIGH (ref 0.61–1.24)
GFR, Estimated: >60 mL/min (ref >60)
Glucose, Bld: 175 mg/dL — ABNORMAL HIGH (ref 70–99)
Potassium: 3.9 mmol/L (ref 3.5–5.1)
Sodium: 140 mmol/L (ref 135–145)
Total Bilirubin: 0.5 mg/dL (ref 0.0–1.2)
Total Protein: 7.3 g/dL (ref 6.5–8.1)

## 2024-07-18 LAB — IRON AND TIBC
Iron: 39 ug/dL — ABNORMAL LOW (ref 45–182)
Saturation Ratios: 12 % — ABNORMAL LOW (ref 17.9–39.5)
TIBC: 314 ug/dL (ref 250–450)
UIBC: 275 ug/dL

## 2024-07-18 LAB — FERRITIN: Ferritin: 63 ng/mL (ref 24–336)

## 2024-07-18 LAB — FOLATE: Folate: 19.3 ng/mL (ref >5.9)

## 2024-07-18 NOTE — Progress Notes (Signed)
 [facility] at [facility] [address] [place],  [place]  [address] [phone]  Clinic Day:  07/18/2024  Referring physician: Magdaline Debby HERO, MD   HISTORY OF PRESENT ILLNESS:  The patient is a 73 y.o. male with anemia that was previously related to B12 deficiency.  Furthermore, he also has mild renal insufficiency that is also factoring to his mild anemia.  He comes in today for routine follow-up.  Since his last visit, the patient has been doing fairly well.  He did have 1 recent episode of urosepsis, for which he has recuperated.  He continues to receive monthly B12 injections to ensure adequate fortification of his cobalamin stores.  He denies having increased fatigue or any overt forms of blood loss which concern him for worsening anemia.  PHYSICAL EXAM:  Blood pressure (!) 178/88, pulse 72, temperature 98.1 F (36.7 C), temperature source Oral, resp. rate 16, height 6' 1 (1.854 m), weight 276 lb 1.6 oz (125.2 kg), SpO2 97%. Wt Readings from Last 3 Encounters:  07/18/24 276 lb 1.6 oz (125.2 kg)  05/21/24 280 lb (127 kg)  05/16/24 280 lb (127 kg)   Body mass index is 36.43 kg/m. Performance status (ECOG): 2 - Symptomatic, <50% confined to bed Physical Exam Constitutional:      Appearance: Normal appearance. He is not ill-appearing.     Comments: A pleasant older gentleman who is in a wheelchair.  He has a chronically ill appearance.  HENT:     Mouth/Throat:     Mouth: Mucous membranes are moist.     Pharynx: Oropharynx is clear. No oropharyngeal exudate or posterior oropharyngeal erythema.  Cardiovascular:     Rate and Rhythm: Normal rate and regular rhythm.     Heart sounds: No murmur heard.    No friction rub. No gallop.  Pulmonary:     Effort: Pulmonary effort is normal. No respiratory distress.     Breath sounds: Normal breath sounds. No wheezing, rhonchi or rales.  Abdominal:     General: Bowel sounds are normal. There is no distension.      Palpations: Abdomen is soft. There is no mass.     Tenderness: There is no abdominal tenderness.  Musculoskeletal:        General: No swelling.     Right lower leg: No edema.     Left lower leg: No edema.  Lymphadenopathy:     Cervical: No cervical adenopathy.     Upper Body:     Right upper body: No supraclavicular or axillary adenopathy.     Left upper body: No supraclavicular or axillary adenopathy.     Lower Body: No right inguinal adenopathy. No left inguinal adenopathy.  Skin:    General: Skin is warm.     Coloration: Skin is not jaundiced.     Findings: No lesion or rash.  Neurological:     General: No focal deficit present.     Mental Status: He is alert and oriented to person, place, and time. Mental status is at baseline.  Psychiatric:        Mood and Affect: Mood normal.        Behavior: Behavior normal.        Thought Content: Thought content normal.    LABS:      Latest Ref Rng & Units 07/18/2024   12:56 PM 05/18/2024    6:00 AM 05/17/2024    4:45 AM  CBC  WBC 4.0 - 10.5 K/uL 14.6  12.5  11.7   Hemoglobin 13.0 - 17.0 g/dL 88.7  87.5  88.1   Hematocrit 39.0 - 52.0 % 33.9  36.7  35.2   Platelets 150 - 400 K/uL 227  210  196       Latest Ref Rng & Units 07/18/2024   12:56 PM 05/18/2024    6:00 AM 05/17/2024    4:45 AM  CMP  Glucose 70 - 99 mg/dL 824  809  808   BUN 8 - 23 mg/dL 17  25  22    Creatinine 0.61 - 1.24 mg/dL 8.74  8.35  8.66   Sodium 135 - 145 mmol/L 140  138  135   Potassium 3.5 - 5.1 mmol/L 3.9  4.3  4.5   Chloride 98 - 111 mmol/L 98  97  99   CO2 22 - 32 mmol/L 25  23  25    Calcium  8.9 - 10.3 mg/dL 9.1  9.5  9.3   Total Protein 6.5 - 8.1 g/dL 7.3     Total Bilirubin 0.0 - 1.2 mg/dL 0.5     Alkaline Phos 38 - 126 U/L 42     AST 15 - 41 U/L 12     ALT 0 - 44 U/L 6       Latest Reference Range & Units 07/18/24 12:56 07/18/24 12:57  Iron  45 - 182 ug/dL  39 (L)  UIBC ug/dL  724  TIBC 749 - 549 ug/dL  685  Saturation Ratios 17.9 - 39.5 %  12  (L)  Ferritin 24 - 336 ng/mL  63  Folate >5.9 ng/mL  19.3  Vitamin B12 180 - 914 pg/mL 642   (L): Data is abnormally low.  ASSESSMENT & PLAN:  Assessment/Plan:  A 73 y.o. male with anemia secondary to  medical problems, including mild renal insufficiency and B12 deficiency.  I am pleased as his hemoglobin remains above 10.  A he knows to continue taking his B12 shots on a monthly basis to ensure adequate fortification of his cobalamin stores.  I will see him back in 4 months for repeat clinical assessment.  The patient understands all the plans discussed today and is in agreement with them.    Ada Holness DELENA Kerns, MD

## 2024-07-19 ENCOUNTER — Telehealth: Payer: Self-pay

## 2024-07-19 NOTE — Telephone Encounter (Signed)
 Latest Reference Range & Units 07/18/24 12:56 07/18/24 12:57  Iron  45 - 182 ug/dL  39 (L)  UIBC ug/dL  724  TIBC 749 - 549 ug/dL  685  Saturation Ratios 17.9 - 39.5 %  12 (L)  Ferritin 24 - 336 ng/mL  63  Folate >5.9 ng/mL  19.3  Vitamin B12 180 - 914 pg/mL 642   (L): Data is abnormally low  Dr Ezzard: I am pleased as his hemoglobin remains above 10.  A he knows to continue taking his B12 shots on a monthly basis to ensure adequate fortification of his cobalamin stores.  I will see him back in 4 months for repeat clinical assessment.

## 2024-07-28 LAB — BASIC METABOLIC PANEL WITH GFR: EGFR: 53

## 2024-07-30 ENCOUNTER — Telehealth: Payer: Self-pay

## 2024-07-30 ENCOUNTER — Ambulatory Visit: Admitting: Podiatry

## 2024-07-30 NOTE — Telephone Encounter (Signed)
 Spoke with [name] per DPR who states that the pt was in [facility] and had multiple medication changes. She is concerned that some of the changes will cause his magnesium  to drop.    Changes are as follow  Carvedilol  25 mg BID Clonidine  0.3 mg TID Furosemide  40 mg daily Hydralazine  50 mg TID Isosorbide  Dinitrate 20 mg TID Magnesium  400 mg BID Metformin 2,000 mg BID  Chart has been printed and placed in your basket to the left of your desk.  [name] also states that she bought the [employer] Platinum Blood Pressure Monitor for Home Use & Upper Arm Blood Pressure Cuff - #1 Doctor & Pharmacist Recommended Brand - Clinically Validated - AFib Detection - Connect App. [name] states that it will not take his BP and states a fib detected please notify MD.

## 2024-07-30 NOTE — Telephone Encounter (Signed)
 Patient's wife wants a recommendation on a blood pressure cuff, that will read past his A-fib. The one she got from [place] will not read his BP.

## 2024-07-31 NOTE — Telephone Encounter (Signed)
Called but no answer or voicemail.

## 2024-07-31 NOTE — Telephone Encounter (Signed)
 Called the patient's wife Anthony Mueller and she stated that she called the [employer] and they told her that she needed the silver edition of the [employer] blood pressure device instead of the platinum version. She is now in the process of returning the platinum version of the [employer] device and has purchased the silver version of the [employer] blood pressure device. The silver version has not arrived at this time. She would like to hold off doing a nurse visit to compare blood pressure readings with their device until she sees if the silver version will give them a blood pressure reading. I asked that if the new [employer] device did not give them any readings, then we could set-up a nurse visit to compare their device to our blood pressure readings. Anthony Mueller verbalized understanding and had no further questions at this time.

## 2024-08-06 DIAGNOSIS — R7989 Other specified abnormal findings of blood chemistry: Secondary | ICD-10-CM | POA: Insufficient documentation

## 2024-08-06 DIAGNOSIS — N390 Urinary tract infection, site not specified: Secondary | ICD-10-CM | POA: Insufficient documentation

## 2024-08-06 DIAGNOSIS — R2243 Localized swelling, mass and lump, lower limb, bilateral: Secondary | ICD-10-CM | POA: Insufficient documentation

## 2024-08-10 ENCOUNTER — Ambulatory Visit: Payer: Self-pay

## 2024-08-15 ENCOUNTER — Telehealth: Payer: Self-pay

## 2024-08-15 NOTE — Telephone Encounter (Signed)
 Pt's wife wants to know if Caulin could have his B12 sublingual, rather than an injection? She has talked with her pharmacist and he told her thy carry the B12 in sublingual. Dr Ezzard notified and is agreeable to pt taking oral B12 1mg  po qd.

## 2024-08-19 ENCOUNTER — Other Ambulatory Visit: Payer: Self-pay

## 2024-08-21 ENCOUNTER — Ambulatory Visit

## 2024-08-21 VITALS — BP 160/88 | HR 62 | Ht 73.0 in | Wt 258.0 lb

## 2024-08-21 DIAGNOSIS — I509 Heart failure, unspecified: Secondary | ICD-10-CM

## 2024-08-21 DIAGNOSIS — I110 Hypertensive heart disease with heart failure: Secondary | ICD-10-CM | POA: Diagnosis not present

## 2024-08-21 DIAGNOSIS — I4819 Other persistent atrial fibrillation: Secondary | ICD-10-CM

## 2024-08-21 DIAGNOSIS — I2510 Atherosclerotic heart disease of native coronary artery without angina pectoris: Secondary | ICD-10-CM | POA: Diagnosis not present

## 2024-08-21 DIAGNOSIS — I5042 Chronic combined systolic (congestive) and diastolic (congestive) heart failure: Secondary | ICD-10-CM

## 2024-08-21 MED ORDER — ISOSORBIDE DINITRATE 30 MG PO TABS: 30.0000 mg | ORAL_TABLET | Freq: Three times a day (TID) | ORAL | 3 refills | Status: AC

## 2024-08-21 NOTE — Assessment & Plan Note (Signed)
 Suboptimal blood pressure. Longstanding history of hypertension. Titrate up treatment with increasing isosorbide  dose to 30 mg 3 times daily.

## 2024-08-21 NOTE — Assessment & Plan Note (Signed)
 Rates remain well-controlled. Not on anticoagulation due to prior bleeding complications.  Continue with rate control strategy. Remains on carvedilol  12.5 mg twice daily. Clonidine  0.3 mg twice daily also likely contributing to rate control to an extent.

## 2024-08-21 NOTE — Progress Notes (Signed)
 Cardiology Consultation:    Date:  08/21/2024   ID:  Anthony Mueller, Anthony Mueller 10-10-1951, MRN [id number]  PCP:  Anthony Debby HERO, MD  Cardiologist:  Anthony SAUNDERS Branden Shallenberger, MD   Referring MD: Anthony Debby HERO, MD   No chief complaint on file.    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  CAD workup in context of mild cardiomyopathy was pursued noted with severe triple-vessel disease on cardiac CT June 2025 with hemodynamically significant lesion in mid LAD and CTO of distal LCx.  Cardiac cath scheduled and completed 05/07/2024 noted multivessel disease but no obvious targets for revascularization.   Recent discharge from [facility] after acute admission for urinary retention and hypertensive urgency.   Problem List Items Addressed This Visit     Hypertensive heart disease   Suboptimal blood pressure. Longstanding history of hypertension. Titrate up treatment with increasing isosorbide  dose to 30 mg 3 times daily.       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  Appears compensated. Hypervolemic  Continue with salt restriction below 2 g/day. Continue with furosemide  40 mg, once daily as the dose was recently titrated up at recent hospital stay.  Will obtain CMP, magnesium , CBC for further evaluation of his electrolytes and renal function  Guideline directed medical therapy limited due to blood pressure, renal function.  Continue carvedilol  12.5 mg twice daily. Off losartan  and spironolactone since July 2025 after hospital admission for UTI and renal function deterioration and hyperkalemia. Not a candidate for SGLT2 inhibitors due to mobility issues, suprapubic catheter and UTIs. Continue hydralazine  50 mg 3 times daily Continue isosorbide  dinitrate increase the dose to 30 mg 3 times daily given the elevated blood pressures.  Has remained on clonidine  0.3 mg twice daily for blood pressure control.  Continue the same for now.      Relevant Medications   hydrALAZINE  (APRESOLINE ) 50 MG tablet   isosorbide  dinitrate (ISORDIL ) 30 MG tablet   Other Relevant Orders   Comprehensive metabolic panel with GFR   CBC   Magnesium    Persistent atrial fibrillation (HCC)   Rates remain well-controlled. Not on anticoagulation due to prior bleeding complications.  Continue with rate control strategy. Remains on carvedilol  12.5 mg twice daily. Clonidine  0.3 mg twice daily also likely contributing to rate control to an extent.       Relevant Medications   hydrALAZINE  (APRESOLINE ) 50 MG tablet   isosorbide  dinitrate (ISORDIL ) 30 MG tablet   CAD (coronary artery disease)   Asymptomatic. Continue with aspirin  81 mg once daily Continue with carvedilol  12.5 mg twice daily. Continue with lovastatin 40 mg once daily.      Relevant Medications   hydrALAZINE  (APRESOLINE ) 50  MG tablet   isosorbide  dinitrate (ISORDIL ) 30 MG tablet  Return to clinic tentatively in 6 months.    History of Present Illness:    Anthony Mueller is a 73  y.o. male who is being seen today for follow-up visit. PCP is Anthony Debby HERO, MD. Last visit with me in the office was 05/21/2024.  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  CAD workup in context of mild cardiomyopathy was pursued noted with severe triple-vessel disease on cardiac CT June 2025 with hemodynamically significant lesion in mid LAD and CTO of distal LCx.  Cardiac cath scheduled and completed 05/07/2024 noted multivessel disease but no obvious targets for revascularization.   Recently admitted at [facility] admitted between 07/25/2024 through 07/28/2024 where he was taken from home in the setting of difficulty switching his urinary suprapubic catheter, in the ER was noted to have elevated blood pressures likely in the setting of discomfort with ongoing urinary retention. At discharge hydralazine  dose was increased to 50 mg 3 times daily and isosorbide  dinitrate increased to 20 mg 3 times daily and furosemide  40 mg was prescribed to be taken daily.  CT abdomen and pelvis during inpatient stay made a note about moderate cardiomegaly  Weight discharge from the hospital noted 258 pounds.  Was 280 pounds at last office visit with me.  Here for the visit today accompanied by his wife.  Patient is primary caregiver and mentions they have help from their son.  He has extremely limited mobility and requires a chairlift for moving in and out of the wheelchair and for transportation.  Here for  the visit today mentions his breathing is better.  Denies any chest pain.  Denies any blood in urine or stools. Has pending follow-up visit with urologist Dr. Carolee tomorrow at [facility] u[facility] in [place] for repositioning of suprapubic catheter and dilatation.  His blood pressure has been better in comparison to his recent inpatient stay however she mentions blood pressures typically can be around 150s to 160s systolic. They backed off on the isosorbide  for a few days last week and resumed this Monday. They were concerned about low diastolic blood pressures.  Systolic blood pressures were consistently around 150s while on isosorbide .  Bilateral lower extremity edema present.  Relatively unchanged.  Blood work from 07/28/2024 notes sodium 136, potassium 3.5, BUN 17, creatinine 1.4, eGFR 53 CBC from 07/24/2024 hemoglobin 13.3, hematocrit 40.2, WBC 13.4 and platelets 233  Past Medical History:  Diagnosis Date   Accelerated hypertension 03/31/2022   Acute diastolic (congestive) heart failure (HCC) 03/31/2022   Acute on chronic anemia 01/13/2022   AKI (acute kidney injury) 01/13/2022   At risk for fall due to comorbid condition 01/20/2022   B12 deficiency 03/01/2022   Benign hypertension with CKD (chronic kidney disease) stage III [facility]) 12/28/2023   11/22/2023 Nephrology OV note.   11/22/2023 Nephrology OV note.     Bilateral lower extremity edema 01/13/2022   CAD (coronary artery disease) 04/18/2024   Calculus of gallbladder with acute cholecystitis without obstruction 02/28/2022   Cardiomegaly 11/10/2021   Catheter-associated urinary  tract infection 05/17/2024   Cerebellar degeneration    CHF (congestive heart failure) (HCC) 03/31/2022   CHF exacerbation (HCC) 03/31/2022   Cholelithiasis 11/10/2021   Chronic diastolic heart failure (HCC) 03/31/2022   Chronic kidney disease, stage 3a [facility]) 12/28/2023   11/22/2023 Nephrology OV note.     Chronic suprapubic catheter (HCC) 12/08/2022    [facility]     Diabetes mellitus without complication (HCC)    DM type 2 with diabetic mixed hyperlipidemia (HCC) 03/04/2020   Does not transfer from wheelchair to toilet 01/20/2022   Drug therapy 02/02/2016   Elevated antinuclear antibody (ANA) level 10/06/2015   Frail elderly 04/07/2022   Gait disorder 09/07/2016   GERD (gastroesophageal reflux disease)    Gout 08/28/2012   History of 2019 novel coronavirus disease (COVID-19) 10/27/2021   History of colonic polyps 11/14/2011   History of esophageal stricture 05/02/2018   Hypercholesteremia 03/31/2022   Hypercoagulable state due to persistent atrial fibrillation (HCC) 12/16/2021   Hyperlipidemia    Hypertension    Hypertension, essential, benign 08/28/2012   Hypertensive heart disease 01/05/2022   Hypertensive heart disease with acute on chronic diastolic congestive heart failure (HCC) 03/31/2022   Hypertensive urgency 03/31/2022   Hypokalemia 11/10/2021   Hypomagnesemia 01/13/2022   Intertrigo 09/03/2018   Iron  deficiency anemia due to chronic blood loss 03/01/2022   Long term (current) use of anticoagulants 12/16/2021   Malaise and fatigue 03/31/2022   Muscular deconditioning 12/23/2021   Obesity (BMI 30-39.9) 03/15/2023   Occult GI bleeding 11/06/2017   OSA (obstructive sleep apnea) 01/13/2022   Formatting of this note might be different from the original. WEARS CPAP   Persistent atrial fibrillation (HCC) 11/13/2021   Formatting of this note might be different from the original. Last Assessment & Plan:  Formatting of this note might be different from the original. - Patient noted to be in atrial fibrillation, bradycardia with sinus pauses -Per cardiology, started on apixaban , will need to be stopped prior to his upcoming rectal surgery (stop 5 days prior to the surgery and then resume once cleared by surgery se   Positive ANA (antinuclear antibody) 10/06/2015   Postoperative anemia due to acute blood loss 01/20/2022    Postoperative examination 02/28/2022   Pulmonary edema 03/31/2022   Rectal bleeding 01/14/2022   Resistant hypertension 01/13/2022   RLS (restless legs syndrome) 09/08/2016   Shortness of breath 03/31/2022   Skin cancer    Transaminitis 11/10/2021   Trigger middle finger of left hand 10/07/2015   Type 2 diabetes mellitus with hyperlipidemia (HCC) 11/10/2021   Uninhibited neuropathic bladder, 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   Unspecified atrial fibrillation (HCC) with sinus pauses 11/13/2021   Vitamin D deficiency due to chronic kidney disease 12/29/2023    Past Surgical History:  Procedure Laterality Date   APPENDECTOMY     CATARACT EXTRACTION, BILATERAL     CHOLECYSTECTOMY     HEMORRHOID SURGERY     IR CATHETER TUBE CHANGE  06/06/2022   LEFT HEART CATH AND CORONARY ANGIOGRAPHY N/A 05/07/2024   Procedure: LEFT HEART CATH AND CORONARY ANGIOGRAPHY;  Surgeon: Anner Alm ORN, MD;  Location: [facility] INVASIVE CV LAB;  Service: Cardiovascular;  Laterality: N/A;   POLYPECTOMY     ULNAR NERVE REPAIR Left     Current Medications: Current Meds  Medication Sig   ACCU-CHEK GUIDE TEST test strip 1 each by Other route.   Accu-Chek Softclix Lancets lancets 1 each daily.   albuterol  (VENTOLIN  HFA) 108 (90 Base) MCG/ACT inhaler  Inhale 2 puffs into the lungs every 6 (six) hours as needed for wheezing or shortness of breath.   allopurinol  (ZYLOPRIM ) 100 MG tablet Take 100 mg by mouth 2 (two) times daily.   Ascorbic Acid  (VITAMIN C) 1000 MG tablet Take 1,000 mg by mouth daily.   aspirin  EC 81 MG tablet Take 81 mg by mouth daily.   carvedilol  (COREG ) 12.5 MG tablet Take 12.5 mg by mouth 2 (two) times daily with a meal. (Patient taking differently: Take 25 mg by mouth 2 (two) times daily with a meal.)   cloNIDine  (CATAPRES ) 0.3 MG tablet Take 1 tablet (0.3 mg total) by mouth 2 (two) times daily.   Cyanocobalamin  (B-12) 1000 MCG SUBL Place 1,000 mg under the tongue daily.   ferrous sulfate  ER (IRON  SLOW  RELEASE) 142 (45 Fe) MG TBCR tablet Take 45 mg by mouth every evening.   furosemide  (LASIX ) 40 MG tablet Take 1 tablet (40 mg total) by mouth every Monday, Wednesday, Friday and Saturday (Patient taking differently: Take 40 mg by mouth as directed. Every Monday, Wednesday, Friday, and Saturday)   hydrALAZINE  (APRESOLINE ) 25 MG tablet Take 1 tablet (25 mg total) by mouth 3 (three) times daily.   hydrALAZINE  (APRESOLINE ) 50 MG tablet Take 50 mg by mouth 3 (three) times daily.   linagliptin  (TRADJENTA ) 5 MG TABS tablet Take 5 mg by mouth daily.   lovastatin (MEVACOR) 40 MG tablet Take 40 mg by mouth every evening.   Magnesium  Oxide -Mg Supplement 400 MG CAPS Take 800 mg by mouth daily. (Patient taking differently: Take 400 mg by mouth 2 (two) times daily.)   metFORMIN (GLUCOPHAGE) 500 MG tablet Take 1,000 mg by mouth 2 (two) times daily.   nitroGLYCERIN  (NITROSTAT ) 0.4 MG SL tablet Place 1 tablet (0.4 mg total) under the tongue every 5 (five) minutes as needed.   omeprazole (PRILOSEC) 20 MG capsule Take 20 mg by mouth in the morning and at bedtime.   oxaprozin (DAYPRO) 600 MG tablet Take 1,200 mg by mouth daily as needed (Gout).   timolol  (TIMOPTIC ) 0.5 % ophthalmic solution Place 1 drop into both eyes daily.   Vitamin D, Ergocalciferol, (DRISDOL) 1.25 MG (50000 UNIT) CAPS capsule Take 50,000 Units by mouth every Sunday.   zinc  gluconate 50 MG tablet Take 50 mg by mouth daily.   [DISCONTINUED] isosorbide  dinitrate (ISORDIL ) 20 MG tablet Take 20 mg by mouth 3 (three) times daily.   [DISCONTINUED] nystatin (MYCOSTATIN/NYSTOP) powder Apply 1 Application topically daily as needed (rash).   [DISCONTINUED] TRADJENTA  5 MG TABS tablet Take 5 mg by mouth daily.     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  Tobacco Use   Smoking status: Former    Types: Cigarettes    Passive exposure: Never   Smokeless tobacco: Never  Vaping Use   Vaping status: Never Used  Substance and Sexual Activity   Alcohol use: Yes    Comment: RARELY   Drug use: Never   Sexual activity: Not Currently  Other Topics Concern   Not on file  Social History Narrative   Not on file   Social Drivers of Health   Financial Resource Strain: Not  on file  Food Insecurity: Patient Declined (05/17/2024)   Hunger Vital Sign    Worried About Running Out of Food in the Last Year: Patient declined    Ran Out of Food in the Last Year: Patient declined  Transportation Needs: Patient Declined (05/17/2024)   PRAPARE - Administrator, Civil Service (Medical): Patient declined    Lack of Transportation (Non-Medical): Patient declined  Physical Activity: Not on file  Stress: Not on file  Social Connections: Patient Declined (05/17/2024)   Social Connection and Isolation Panel    Frequency of Communication with Friends and Family: Patient declined    Frequency of Social Gatherings with Friends and Family: Patient declined    Attends Religious Services: Patient declined    Database administrator or Organizations: Patient declined    Attends Engineer, structural: Patient declined    Marital Status: Patient declined     Family History: The patient's family history includes Colon cancer in his father; Diabetes in his mother; Healthy in his sister; Hyperlipidemia in his mother; Hypertension in his mother; Stroke in his mother. ROS:   Please see the history of present illness.    All 14 point review of systems negative except as described per history of present illness.  EKGs/Labs/Other Studies Reviewed:    The following studies were reviewed today:   EKG:       Recent Labs: 03/21/2024: Magnesium   1.5 05/17/2024: B Natriuretic Peptide 295.5 05/18/2024: TSH 10.149 07/18/2024: ALT 6; BUN 17; Creatinine 1.25; Hemoglobin 11.2; Platelet Count 227; Potassium 3.9; Sodium 140  Recent Lipid Panel    Component Value Date/Time   LDLDIRECT 40 03/05/2024 1442    Physical Exam:    VS:  BP (!) 166/88   Pulse 62   Ht 6' 1 (1.854 m)   Wt 258 lb (117 kg)   SpO2 95%   BMI 34.04 kg/m     Wt Readings from Last 3 Encounters:  08/21/24 258 lb (117 kg)  07/18/24 276 lb 1.6 oz (125.2 kg)  05/21/24 280 lb (127 kg)     GENERAL:  Well nourished, well developed in no acute distress.  Sitting in his wheelchair.  Requires full assistance. NECK: No JVD; No carotid bruits CARDIAC: RRR, S1 and S2 present, no murmurs, no rubs, no gallops CHEST:  Clear to auscultation without rales, wheezing or rhonchi  Extremities: 1+ bilateral pitting pedal edema extending up to the thighs.  Has compression socks on.  Pulses bilaterally symmetric with radial 2+ and dorsalis pedis 2+ NEUROLOGIC:  Alert and oriented x 3  Medication Adjustments/Labs and Tests Ordered: Current medicines are reviewed at length with the patient today.  Concerns regarding medicines are outlined above.  Orders Placed This Encounter  Procedures   Comprehensive metabolic panel with GFR   CBC   Magnesium    Meds ordered this encounter  Medications   isosorbide  dinitrate (ISORDIL ) 30 MG tablet    Sig: Take 1 tablet (30 mg total) by mouth 3 (three) times daily.    Dispense:  270 tablet    Refill:  3    Signed, Frederica Chrestman reddy Merlyn Conley, MD, MPH, [facility]. 08/21/2024 1:32 PM     [facility]

## 2024-08-21 NOTE — Patient Instructions (Addendum)
 Medication Instructions:  Your physician has recommended you make the following change in your medication:   Increase Isosorbide  Dinitrate to 30 mg 3 times daily   *If you need a refill on your cardiac medications before your next appointment, please call your pharmacy*   Lab Work: Your physician recommends that you have a CMP, Magnesium  and CBC today in the office.  If you have labs (blood work) drawn today and your tests are completely normal, you will receive your results only by: MyChart Message (if you have MyChart) OR A paper copy in the mail If you have any lab test that is abnormal or we need to change your treatment, we will call you to review the results.   Testing/Procedures: None ordered   Follow-Up: At [facility], you and your health needs are our priority.  As part of our continuing mission to provide you with exceptional heart care, we have created designated Provider Care Teams.  These Care Teams include your primary Cardiologist (physician) and Advanced Practice Providers (APPs -  Physician Assistants and Nurse Practitioners) who all work together to provide you with the care you need, when you need it.  We recommend signing up for the patient portal called MyChart.  Sign up information is provided on this After Visit Summary.  MyChart is used to connect with patients for Virtual Visits (Telemedicine).  Patients are able to view lab/test results, encounter notes, upcoming appointments, etc.  Non-urgent messages can be sent to your provider as well.   To learn more about what you can do with MyChart, go to [email or web address].    Your next appointment:   6 month(s)  The format for your next appointment:   In Person  Provider:   Alean Kobus, MD    Other Instructions none  Important Information About Sugar

## 2024-08-21 NOTE — Assessment & Plan Note (Signed)
 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  Appears compensated. Hypervolemic  Continue with salt restriction below 2 g/day. Continue with furosemide  40 mg, once daily as the dose was recently titrated up at recent hospital stay.  Will obtain CMP, magnesium , CBC for further evaluation of his electrolytes and renal function  Guideline directed medical therapy limited due to blood pressure, renal function.  Continue carvedilol  12.5 mg twice daily. Off losartan  and spironolactone since July 2025 after hospital admission for UTI and renal function deterioration and hyperkalemia. Not a candidate for SGLT2 inhibitors due to mobility issues, suprapubic catheter and UTIs. Continue hydralazine  50 mg 3 times daily Continue isosorbide  dinitrate increase the dose to 30 mg 3 times daily given the elevated blood pressures.  Has remained on clonidine  0.3 mg twice daily for blood pressure control.  Continue the same for now.

## 2024-08-21 NOTE — Assessment & Plan Note (Signed)
 Asymptomatic. Continue with aspirin  81 mg once daily Continue with carvedilol  12.5 mg twice daily. Continue with lovastatin 40 mg once daily.

## 2024-08-22 ENCOUNTER — Ambulatory Visit: Payer: Self-pay

## 2024-08-22 LAB — COMPREHENSIVE METABOLIC PANEL WITH GFR
ALT: 11 IU/L (ref 0–44)
AST: 11 IU/L (ref 0–40)
Albumin: 3.9 g/dL (ref 3.8–4.8)
Alkaline Phosphatase: 45 IU/L — ABNORMAL LOW (ref 47–123)
BUN/Creatinine Ratio: 15 (ref 10–24)
BUN: 26 mg/dL (ref 8–27)
Bilirubin Total: 0.3 mg/dL (ref 0.0–1.2)
CO2: 27 mmol/L (ref 20–29)
Calcium: 9.1 mg/dL (ref 8.6–10.2)
Chloride: 93 mmol/L — ABNORMAL LOW (ref 96–106)
Creatinine, Ser: 1.69 mg/dL — ABNORMAL HIGH (ref 0.76–1.27)
Globulin, Total: 2.8 g/dL (ref 1.5–4.5)
Glucose: 186 mg/dL — ABNORMAL HIGH (ref 70–99)
Potassium: 3.7 mmol/L (ref 3.5–5.2)
Sodium: 139 mmol/L (ref 134–144)
Total Protein: 6.7 g/dL (ref 6.0–8.5)
eGFR: 42 mL/min/1.73 — ABNORMAL LOW (ref >59)

## 2024-08-22 LAB — CBC
Hematocrit: 34.8 % — ABNORMAL LOW (ref 37.5–51.0)
Hemoglobin: 10.8 g/dL — ABNORMAL LOW (ref 13.0–17.7)
MCH: 30.3 pg (ref 26.6–33.0)
MCHC: 31 g/dL — ABNORMAL LOW (ref 31.5–35.7)
MCV: 98 fL — ABNORMAL HIGH (ref 79–97)
Platelets: 205 x10E3/uL (ref 150–450)
RBC: 3.56 x10E6/uL — ABNORMAL LOW (ref 4.14–5.80)
RDW: 13 % (ref 11.6–15.4)
WBC: 11.8 x10E3/uL — ABNORMAL HIGH (ref 3.4–10.8)

## 2024-08-22 LAB — MAGNESIUM: Magnesium: 1.2 mg/dL — ABNORMAL LOW (ref 1.6–2.3)

## 2024-08-22 NOTE — Progress Notes (Signed)
 Magnesium  levels are markedly reduced. Blood counts are similar to last month's blood works showing elevated white blood counts and mild anemia. Electrolytes show normal sodium and potassium and liver functions. Kidney function shows an increase in creatinine to 1.69 in comparison to 1.4 at recent hospital stay on 07/28/2024.  Switch his Lasix  dose to 20 mg once a day in the morning with additional 20 mg every other day. Repeat blood work basic metabolic panel in 4 weeks. Increase magnesium  supplement to 800 twice daily. Thank you.

## 2024-08-29 ENCOUNTER — Other Ambulatory Visit: Payer: Self-pay | Admitting: Urology

## 2024-08-29 ENCOUNTER — Telehealth: Payer: Self-pay | Admitting: Cardiology

## 2024-08-29 NOTE — Telephone Encounter (Signed)
   Pre-operative Risk Assessment    Patient Name: Anthony Mueller  DOB: 12-02-1950 MRN: [id number]      Request for Surgical Clearance    Procedure:  Cysto Suprapubic tube dilation  Date of Surgery:  Clearance 10/18/24                                 Surgeon:  Dr. Carolee Surgeon's Group or Practice Name:  [facility] Phone number:  [phone] Fax number:  [phone]   Type of Clearance Requested:   - Medical  - Pharmacy:  Hold Aspirin  5 Days   Type of Anesthesia:  MAC   Additional requests/questions:    SignedRojelio Kays   08/29/2024, 4:44 PM

## 2024-08-30 NOTE — Telephone Encounter (Signed)
   Patient Name: Anthony Mueller  DOB: 24-Oct-1951 MRN: [id number]  Primary Cardiologist: Redell Leiter, MD  Chart reviewed as part of pre-operative protocol coverage. Was evaluated by Dr. Liborio on 08/21/2024, his recommendations regarding surgery:  He does have significant coronary artery disease, without any targets for revascularization and continued on medical therapy. Ideally would recommend continuing aspirin  uninterrupted. If this is prohibitive from the procedural standpoint and aspirin  requires to be held, would recommend urology team having an informed discussion with the patient about risk benefits of withholding aspirin  and proceed based on shared decision making. Thank you  I will route this recommendation to the requesting party via Epic fax function and remove from pre-op pool.  Please call with questions.  Kenzlie Disch E Maxx Calaway, NP 08/30/2024, 12:59 PM

## 2024-08-30 NOTE — Telephone Encounter (Signed)
 Dr. Liborio, you recently saw this pt in clinic. Are you able to comment on surgical clearance and aspirin  hold for upcoming Cysto Suprapubic tube dilation scheduled for 10/18/2024? Please route your response to P CV DIV PREOP. Thank you!

## 2024-09-03 ENCOUNTER — Ambulatory Visit: Admitting: Podiatry

## 2024-09-03 ENCOUNTER — Encounter: Payer: Self-pay | Admitting: Podiatry

## 2024-09-03 DIAGNOSIS — M79675 Pain in left toe(s): Secondary | ICD-10-CM

## 2024-09-03 DIAGNOSIS — E1142 Type 2 diabetes mellitus with diabetic polyneuropathy: Secondary | ICD-10-CM

## 2024-09-03 DIAGNOSIS — B351 Tinea unguium: Secondary | ICD-10-CM

## 2024-09-03 DIAGNOSIS — I739 Peripheral vascular disease, unspecified: Secondary | ICD-10-CM | POA: Diagnosis not present

## 2024-09-03 DIAGNOSIS — M79674 Pain in right toe(s): Secondary | ICD-10-CM

## 2024-09-03 NOTE — Progress Notes (Signed)
  Subjective:  Patient ID: Anthony Mueller, male    DOB: 07-02-1951,  MRN: [id number]  Chief Complaint  Patient presents with   [facility]    [facility] unsure about callous. He dose stat the tip of the left hallux is tender and painful at night with the cover over it. Wife asked for a refill of the ointment, but cannot remember the name of it. It is  not on his chart.  A1c 7.4 in Sept.  ASA    73 y.o. male presents with the above complaint. History confirmed with patient. Patient presenting with pain related to dystrophic thickened elongated nails. Patient is unable to trim own nails related to nail dystrophy and/or mobility issues.  He is presenting in wheelchair.  Patient does  have a history of T2DM.  Last A1c approximately 7.4.   Objective:  Physical Exam: warm, good capillary refill, diminished pedal hair growth, pedal skin atrophic. Dependent rubor to the digits noted nail exam onychomycosis of the toenails, onycholysis, and dystrophic nails DP pulses faintly palpable, PT pulses non palpable, protective sensation absent, and vibratory sensation diminished Left Foot:  Pain with palpation of nails due to elongation and dystrophic growth.  Right Foot: Pain with palpation of nails due to elongation and dystrophic growth.   Assessment:   1. PVD (peripheral vascular disease)   2. Pain due to onychomycosis of toenails of both feet   3. DM type 2 with diabetic peripheral neuropathy (HCC)      Plan:  Patient was evaluated and treated and all questions answered.  #Onychomycosis with pain  -Nails palliatively debrided as below. -Educated on self-care  Procedure: Nail Debridement Rationale: Pain Type of Debridement: manual, sharp debridement. Instrumentation: Nail nipper, rotary burr. Number of Nails: 10  # Diabetes with neuropathy Patient educated on diabetes. Discussed proper diabetic foot care and discussed risks and complications of disease. Educated patient in depth on reasons to  return to the office immediately should he/she discover anything concerning or new on the feet. All questions answered. Discussed proper shoes as well.   # PAD - Wife expressing concern over redness to the digits - Ordering baseline ABI studies due to decreased pedal pulses - Patient nonambulatory, denies rest pain -I certify that this diagnosis represents a distinct and separate diagnosis that requires evaluation and treatment separate from other procedures or diagnosis      Return in about 3 months (around 12/04/2024) for Diabetic Foot Care.         Ethan Saddler, DPM [facility] / [facility]

## 2024-09-09 ENCOUNTER — Other Ambulatory Visit: Payer: Self-pay

## 2024-09-09 DIAGNOSIS — I5042 Chronic combined systolic (congestive) and diastolic (congestive) heart failure: Secondary | ICD-10-CM

## 2024-09-09 MED ORDER — MAGNESIUM OXIDE 400 MG PO TABS: 800.0000 mg | ORAL_TABLET | Freq: Two times a day (BID) | ORAL | 3 refills | Status: AC

## 2024-09-09 MED ORDER — FUROSEMIDE 20 MG PO TABS: 20.0000 mg | ORAL_TABLET | Freq: Every day | ORAL | 3 refills | Status: AC

## 2024-09-09 NOTE — Progress Notes (Signed)
 Patient's wife informed of results below per DPR:   Magnesium  levels are markedly reduced. Blood counts are similar to last month's blood works showing elevated white blood counts and mild anemia. Electrolytes show normal sodium and potassium and liver functions. Kidney function shows an increase in creatinine to 1.69 in comparison to 1.4 at recent hospital stay on 07/28/2024.   Switch his Lasix  dose to 20 mg once a day in the morning with additional 20 mg every other day. Repeat blood work basic metabolic panel in 4 weeks. Increase magnesium  supplement to 800 twice daily. Thank you.  Lasix  and Magnesium  ordered via Epic and sent to the patient's pharmacy. Labs ordered via Epic. Patient's wife verbalized understanding and had no further questions at this time.

## 2024-10-04 NOTE — Patient Instructions (Addendum)
 SURGICAL WAITING ROOM VISITATION  Patients having surgery or a procedure may have no more than 2 support people in the waiting area - these visitors may rotate.    Children under the age of 40 must have an adult with them who is not the patient.  Visitors with respiratory illnesses are discouraged from visiting and should remain at home.  If the patient needs to stay at the hospital during part of their recovery, the visitor guidelines for inpatient rooms apply. Pre-op nurse will coordinate an appropriate time for 1 support person to accompany patient in pre-op.  This support person may not rotate.    Please refer to the [facility] website for the visitor guidelines for Inpatients (after your surgery is over and you are in a regular room).       Your procedure is scheduled on: 10/18/24   Report to [facility] Main Entrance    Report to admitting at 10 AM   Call this number if you have problems the morning of surgery [phone]   Do not eat food or drink liquids :After Midnight.but may have sips of water to take meds.     Oral Hygiene is also important to reduce your risk of infection.                                    Remember - BRUSH YOUR TEETH THE MORNING OF SURGERY WITH YOUR REGULAR TOOTHPASTE  DENTURES WILL BE REMOVED PRIOR TO SURGERY PLEASE DO NOT APPLY Poly grip OR ADHESIVES!!!    Stop all vitamins and herbal supplements 7 days before surgery.   Take these medicines the morning of surgery with A SIP OF WATER: carvedilol , clonidine , hydralazine , isosorbide , lovastatin, omeprazole, eye drops.  DO NOT TAKE ANY ORAL DIABETIC MEDICATIONS DAY OF YOUR SURGERY Hold Metformin the morning of surgery. Hold Tradgenta the morning of surgery.  Bring CPAP mask and tubing day of surgery.                              You may not have any metal on your body including hair pins, jewelry, and body piercing             Do not wear make-up, lotions, powders, perfumes/cologne,  or deodorant              Men may shave face and neck.   Do not bring valuables to the hospital. [facility] IS NOT             RESPONSIBLE   FOR VALUABLES.   Contacts, glasses, dentures or bridgework may not be worn into surgery.   DO NOT BRING YOUR HOME MEDICATIONS TO THE HOSPITAL. PHARMACY WILL DISPENSE MEDICATIONS LISTED ON YOUR MEDICATION LIST TO YOU DURING YOUR ADMISSION IN THE HOSPITAL!    Patients discharged on the day of surgery will not be allowed to drive home.  Someone NEEDS to stay with you for the first 24 hours after anesthesia.   Special Instructions: Bring a copy of your healthcare power of attorney and living will documents the day of surgery if you haven't scanned them before.              Please read over the following fact sheets you were given: IF YOU HAVE QUESTIONS ABOUT YOUR PRE-OP INSTRUCTIONS PLEASE CALL [phone] [place].   If you received a  COVID test during your pre-op visit  it is requested that you wear a mask when out in public, stay away from anyone that may not be feeling well and notify your surgeon if you develop symptoms. If you test positive for Covid or have been in contact with anyone that has tested positive in the last 10 days please notify you surgeon.    [facility] - Preparing for Surgery Before surgery, you can play an important role.  Because skin is not sterile, your skin needs to be as free of germs as possible.  You can reduce the number of germs on your skin by washing with CHG (chlorahexidine gluconate) soap before surgery.  CHG is an antiseptic cleaner which kills germs and bonds with the skin to continue killing germs even after washing. Please DO NOT use if you have an allergy to CHG or antibacterial soaps.  If your skin becomes reddened/irritated stop using the CHG and inform your nurse when you arrive at Short Stay. Do not shave (including legs and underarms) for at least 48 hours prior to the first CHG shower.  You may shave your  face/neck.  Please follow these instructions carefully:  1.  Shower with CHG Soap the night before surgery and the morning of surgery.  2.  If you choose to wash your hair, wash your hair first as usual with your normal  shampoo.  3.  After you shampoo, rinse your hair and body thoroughly to remove the shampoo.                             4.  Use CHG as you would any other liquid soap.  You can apply chg directly to the skin and wash.  Gently with a scrungie or clean washcloth.  5.  Apply the CHG Soap to your body ONLY FROM THE NECK DOWN.   Do   not use on face/ open                           Wound or open sores. Avoid contact with eyes, ears mouth and   genitals (private parts).                       Wash face,  Genitals (private parts) with your normal soap.             6.  Wash thoroughly, paying special attention to the area where your    surgery  will be performed.  7.  Thoroughly rinse your body with warm water from the neck down.  8.  DO NOT shower/wash with your normal soap after using and rinsing off the CHG Soap.                9.  Pat yourself dry with a clean towel.            10.  Wear clean pajamas.            11.  Place clean sheets on your bed the night of your first shower and do not  sleep with pets. Day of Surgery : Do not apply any CHG, lotions/deodorants the morning of surgery.  Please wear clean clothes to the hospital/surgery center.  FAILURE TO FOLLOW THESE INSTRUCTIONS MAY RESULT IN THE CANCELLATION OF YOUR SURGERY  PATIENT SIGNATURE_________________________________  NURSE SIGNATURE__________________________________  ________________________________________________________________________How to Manage Your Diabetes Before and  After Surgery  Why is it important to control my blood sugar before and after surgery? Improving blood sugar levels before and after surgery helps healing and can limit problems. A way of improving blood sugar control is eating a healthy  diet by:  Eating less sugar and carbohydrates  Increasing activity/exercise  Talking with your doctor about reaching your blood sugar goals High blood sugars (greater than 180 mg/dL) can raise your risk of infections and slow your recovery, so you will need to focus on controlling your diabetes during the weeks before surgery. Make sure that the doctor who takes care of your diabetes knows about your planned surgery including the date and location.  How do I manage my blood sugar before surgery? Check your blood sugar at least 4 times a day, starting 2 days before surgery, to make sure that the level is not too high or low. Check your blood sugar the morning of your surgery when you wake up and every 2 hours until you get to the Short Stay unit. If your blood sugar is less than 70 mg/dL, you will need to treat for low blood sugar: Do not take insulin . Treat a low blood sugar (less than 70 mg/dL) with  cup of clear juice (cranberry or apple), 4 glucose tablets, OR glucose gel. Recheck blood sugar in 15 minutes after treatment (to make sure it is greater than 70 mg/dL). If your blood sugar is not greater than 70 mg/dL on recheck, call [phone] for further instructions. Report your blood sugar to the short stay nurse when you get to Short Stay.  If you are admitted to the hospital after surgery: Your blood sugar will be checked by the staff and you will probably be given insulin  after surgery (instead of oral diabetes medicines) to make sure you have good blood sugar levels. The goal for blood sugar control after surgery is 80-180 mg/dL.   WHAT DO I DO ABOUT MY DIABETES MEDICATION?  Do not take oral diabetes medicines (pills) the morning of surgery.           Hold Metformin and Tradgenta the morning of surgery.  Patient Signature:  Date:   Nurse Signature:  Date:   Reviewed and Endorsed by [facility] Patient Education Committee, August 2015

## 2024-10-04 NOTE — Progress Notes (Signed)
 COVID Vaccine received:  []  No [x]  Yes Date of any COVID positive Test in last 90 days: no PCP - Debby Blanch MD Cardiologist - Redell Leiter MD  Chest x-ray - 05/16/24 Epic EKG -  05/17/24 Epic Stress Test -  ECHO - 02/01/22 Epic Cardiac Cath - 05/07/24 Epic  Bowel Prep - [x]  No  []   Yes ______  Pacemaker / ICD device [x]  No []  Yes   Spinal Cord Stimulator:[x]  No []  Yes       History of Sleep Apnea? []  No [x]  Yes   CPAP used?- []  No [x]  Yes    Does the patient monitor blood sugar?          []  No [x]  Yes  []  N/A  Patient has: []  NO Hx DM   []  Pre-DM                 []  DM1  [x]   DM2 Does patient have a [employer] or Dexacom? []  No []  Yes   Fasting Blood Sugar Ranges- 147- fasting Checks Blood Sugar ___1every other day__  GLP1 agonist / usual dose - no GLP1 instructions:  SGLT-2 inhibitors / usual dose - no  Blood Thinner / Instructions:no Aspirin  Instructions: 81 mg- will continue ASA through surgery per MD [facility]  Comments:   Activity level: Patient is unable to climb a flight of stairs without difficulty; []  No CP  []  No SOB, wheel chair bound  Patient can not perform ADLs without assistance.   Anesthesia review: DM, A-fib, OSA, HTN, CHF, CAD, CKD, no blood thinners d/t GI bleed, wheelchair bound, cerebellar degeneration  Patient denies shortness of breath, fever, cough and chest pain at PAT appointment.  Patient verbalized understanding and agreement to the Pre-Surgical Instructions that were given to them at this PAT appointment. Patient was also educated of the need to review these PAT instructions again prior to his/her surgery.I reviewed the appropriate phone numbers to call if they have any and questions or concerns.

## 2024-10-07 ENCOUNTER — Other Ambulatory Visit: Payer: Self-pay

## 2024-10-07 ENCOUNTER — Encounter (HOSPITAL_COMMUNITY): Payer: Self-pay

## 2024-10-07 ENCOUNTER — Encounter (HOSPITAL_COMMUNITY)
Admission: RE | Admit: 2024-10-07 | Discharge: 2024-10-07 | Disposition: A | Source: Ambulatory Visit | Attending: Urology

## 2024-10-07 VITALS — Ht 73.0 in | Wt 258.0 lb

## 2024-10-07 DIAGNOSIS — I10 Essential (primary) hypertension: Secondary | ICD-10-CM

## 2024-10-07 DIAGNOSIS — Z01818 Encounter for other preprocedural examination: Secondary | ICD-10-CM

## 2024-10-07 DIAGNOSIS — E1169 Type 2 diabetes mellitus with other specified complication: Secondary | ICD-10-CM

## 2024-10-07 HISTORY — DX: Cardiac arrhythmia, unspecified: I49.9

## 2024-10-07 HISTORY — DX: Cardiac murmur, unspecified: R01.1

## 2024-10-07 HISTORY — DX: Personal history of other diseases of the digestive system: Z87.19

## 2024-10-07 MED ORDER — HYDRALAZINE HCL 100 MG PO TABS: 100.0000 mg | ORAL_TABLET | Freq: Three times a day (TID) | ORAL | 3 refills | Status: AC

## 2024-10-07 NOTE — Progress Notes (Signed)
 Reached pt's wife by phone to do PST interview. She identified patient by name and DOB. She answered all questions. Pre op instructions were given and her questions were answered.Number to admitting given to her to do pre admit.

## 2024-10-08 NOTE — Progress Notes (Signed)
 Anesthesia Chart Review   Case: [id number] Date/Time: 10/18/24 1200   Procedure: CYSTOSCOPY, WITH URETHRAL DILATION - SP DILATION & SP TUBE UPSIZING   Anesthesia type: Monitor Anesthesia Care   Diagnosis: Enlarged prostate with urinary retention [N40.1, R33.8]   Pre-op diagnosis: BENIGN PROSTATIC HYPERPLASIA   Location: [facility] PROCEDURE ROOM / [facility]   Surgeons: Carolee Sherwood JONETTA DOUGLAS, MD       DISCUSSION:73 y.o. former smoker with h/o OSA, HTN, DM II (A1C 8.2), atrial fibrillation, CAD, chronic diastolic heart failure, DM II, CKD Stage III,   H/o CHF with mildly reduced ejection fraction 45-50% on echo 01/2024. Cardiac cath scheduled and completed 05/07/2024 noted multivessel disease but no obvious targets for revascularization.   Per cardiology preoperative evaluation 08/30/2024, Chart reviewed as part of pre-operative protocol coverage. Was evaluated by Dr. Liborio on 08/21/2024, his recommendations regarding surgery:   He does have significant coronary artery disease, without any targets for revascularization and continued on medical therapy. Ideally would recommend continuing aspirin  uninterrupted. If this is prohibitive from the procedural standpoint and aspirin  requires to be held, would recommend urology team having an informed discussion with the patient about risk benefits of withholding aspirin  and proceed based on shared decision making. Thank you  VS: Ht 6' 1 (1.854 m)   Wt 117 kg   BMI 34.04 kg/m   PROVIDERS: Magdaline Debby HERO, MD is PCP   Cardiologist - Redell Leiter MD  LABS: Labs reviewed: Acceptable for surgery. (all labs ordered are listed, but only abnormal results are displayed)  Labs Reviewed - No data to display   IMAGES:   EKG:   CV: Echo 02/05/2024 SUMMARY  Left ventricular systolic function is mildly reduced.  LV ejection fraction = 45-50%.  Anterior and septal hypokinesis.  Normal left ventricular diastolic function and left atrial pressure.  The  right ventricle is mildly dilated.  The right ventricular systolic function is mildly reduced.  The left atrium is moderately dilated.  The right atrium is moderately dilated.  There is mild mitral regurgitation.  There is mild to moderate tricuspid regurgitation.  Mild pulmonary hypertension.  Estimated right ventricular systolic pressure is 45-50 mmHg.  IVC size was mildly dilated.  There is no pericardial effusion.  There is no comparison study available.  -   Echo 02/01/2022  1. Left ventricular ejection fraction, by estimation, is 55 to 60%. The  left ventricle has normal function. The left ventricle has no regional  wall motion abnormalities. There is mild left ventricular hypertrophy.  Left ventricular diastolic parameters  were normal.   2. Right ventricular systolic function is normal. The right ventricular  size is normal. There is mildly elevated pulmonary artery systolic  pressure. The estimated right ventricular systolic pressure is 39.6 mmHg.   3. Left atrial size was moderately dilated.   4. Right atrial size was mildly dilated.   5. The mitral valve is normal in structure. Mild mitral valve  regurgitation. No evidence of mitral stenosis.   6. The aortic valve is tricuspid. Aortic valve regurgitation is trivial.  Aortic valve sclerosis/calcification is present, without any evidence of  aortic stenosis.   7. The inferior vena cava is dilated in size with <50% respiratory  variability, suggesting right atrial pressure of 15 mmHg.  Past Medical History:  Diagnosis Date   Accelerated hypertension 03/31/2022   Acute diastolic (congestive) heart failure (HCC) 03/31/2022   Acute on chronic anemia 01/13/2022   AKI (acute kidney injury) 01/13/2022   At risk  for fall due to comorbid condition 01/20/2022   B12 deficiency 03/01/2022   Benign hypertension with CKD (chronic kidney disease) stage III [facility]) 12/28/2023   11/22/2023 Nephrology OV note.   11/22/2023 Nephrology OV  note.     Bilateral lower extremity edema 01/13/2022   CAD (coronary artery disease) 04/18/2024   Calculus of gallbladder with acute cholecystitis without obstruction 02/28/2022   Cardiomegaly 11/10/2021   Catheter-associated urinary tract infection 05/17/2024   Cerebellar degeneration    CHF (congestive heart failure) (HCC) 03/31/2022   CHF exacerbation (HCC) 03/31/2022   Cholelithiasis 11/10/2021   Chronic diastolic heart failure (HCC) 03/31/2022   Chronic kidney disease, stage 3a [facility]) 12/28/2023   11/22/2023 Nephrology OV note.     Chronic suprapubic catheter (HCC) 12/08/2022   [facility]     Diabetes mellitus without complication (HCC)    DM type 2 with diabetic mixed hyperlipidemia (HCC) 03/04/2020   Does not transfer from wheelchair to toilet 01/20/2022   Drug therapy 02/02/2016   Dysrhythmia    Elevated antinuclear antibody (ANA) level 10/06/2015   Frail elderly 04/07/2022   Gait disorder 09/07/2016   GERD (gastroesophageal reflux disease)    Gout 08/28/2012   Heart murmur    History of 2019 novel coronavirus disease (COVID-19) 10/27/2021   History of colonic polyps 11/14/2011   History of esophageal stricture 05/02/2018   History of hiatal hernia    Hypercholesteremia 03/31/2022   Hypercoagulable state due to persistent atrial fibrillation (HCC) 12/16/2021   Hyperlipidemia    Hypertension    Hypertension, essential, benign 08/28/2012   Hypertensive heart disease 01/05/2022   Hypertensive heart disease with acute on chronic diastolic congestive heart failure (HCC) 03/31/2022   Hypertensive urgency 03/31/2022   Hypokalemia 11/10/2021   Hypomagnesemia 01/13/2022   Intertrigo 09/03/2018   Iron  deficiency anemia due to chronic blood loss 03/01/2022   Long term (current) use of anticoagulants 12/16/2021   Malaise and fatigue 03/31/2022   Muscular deconditioning 12/23/2021   Obesity (BMI 30-39.9) 03/15/2023   Occult GI bleeding 11/06/2017   OSA (obstructive sleep  apnea) 01/13/2022   Formatting of this note might be different from the original. WEARS CPAP   Persistent atrial fibrillation (HCC) 11/13/2021   Formatting of this note might be different from the original. Last Assessment & Plan:  Formatting of this note might be different from the original. - Patient noted to be in atrial fibrillation, bradycardia with sinus pauses -Per cardiology, started on apixaban , will need to be stopped prior to his upcoming rectal surgery (stop 5 days prior to the surgery and then resume once cleared by surgery se   Positive ANA (antinuclear antibody) 10/06/2015   Postoperative anemia due to acute blood loss 01/20/2022   Postoperative examination 02/28/2022   Pulmonary edema 03/31/2022   Rectal bleeding 01/14/2022   Resistant hypertension 01/13/2022   RLS (restless legs syndrome) 09/08/2016   Shortness of breath 03/31/2022   Skin cancer    Transaminitis 11/10/2021   Trigger middle finger of left hand 10/07/2015   Type 2 diabetes mellitus with hyperlipidemia (HCC) 11/10/2021   Uninhibited neuropathic bladder, 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   Unspecified atrial fibrillation (HCC) with sinus pauses 11/13/2021   Vitamin D deficiency due to chronic kidney disease 12/29/2023    Past Surgical History:  Procedure Laterality Date   APPENDECTOMY     CATARACT EXTRACTION, BILATERAL     CHOLECYSTECTOMY     HEMORRHOID SURGERY     IR CATHETER TUBE CHANGE  06/06/2022  LEFT HEART CATH AND CORONARY ANGIOGRAPHY N/A 05/07/2024   Procedure: LEFT HEART CATH AND CORONARY ANGIOGRAPHY;  Surgeon: Anner Alm ORN, MD;  Location: [facility] INVASIVE CV LAB;  Service: Cardiovascular;  Laterality: N/A;   POLYPECTOMY     ULNAR NERVE REPAIR Left     MEDICATIONS:  ACCU-CHEK GUIDE TEST test strip   Accu-Chek Softclix Lancets lancets   allopurinol  (ZYLOPRIM ) 100 MG tablet   Ascorbic Acid  (VITAMIN C PO)   aspirin  EC 81 MG tablet   carvedilol  (COREG ) 12.5 MG tablet   cloNIDine  (CATAPRES ) 0.3  MG tablet   cyanocobalamin  (VITAMIN B12) 1000 MCG/ML injection   ferrous sulfate  ER (IRON  SLOW RELEASE) 142 (45 Fe) MG TBCR tablet   furosemide  (LASIX ) 20 MG tablet   furosemide  (LASIX ) 40 MG tablet   hydrALAZINE  (APRESOLINE ) 100 MG tablet   isosorbide  dinitrate (ISORDIL ) 30 MG tablet   linagliptin  (TRADJENTA ) 5 MG TABS tablet   lovastatin (MEVACOR) 40 MG tablet   magnesium  oxide (MAG-OX) 400 MG tablet   metFORMIN (GLUCOPHAGE) 500 MG tablet   nitroGLYCERIN  (NITROSTAT ) 0.4 MG SL tablet   omeprazole (PRILOSEC) 20 MG capsule   oxaprozin (DAYPRO) 600 MG tablet   timolol  (TIMOPTIC ) 0.5 % ophthalmic solution   Vitamin D, Ergocalciferol, (DRISDOL) 1.25 MG (50000 UNIT) CAPS capsule   zinc  gluconate 50 MG tablet   No current facility-administered medications for this encounter.     Harlene Hoots Ward, PA-C [facility] Pre-Surgical Testing [phone]

## 2024-10-11 ENCOUNTER — Telehealth: Payer: Self-pay

## 2024-10-11 NOTE — Telephone Encounter (Signed)
 I scheduled this patient for Monday but the wife is wanting to know why he needs to be seen? He's coming in Monday morning for blood work and the wife is saying that she can't bring him Monday afternoon. She says she has a lot of appointments next week herself and needs help transporting him and her son is out of town next week. CB # [id number]

## 2024-10-14 ENCOUNTER — Telehealth: Payer: Self-pay

## 2024-10-14 ENCOUNTER — Ambulatory Visit

## 2024-10-14 NOTE — Telephone Encounter (Signed)
 Order placed

## 2024-10-15 LAB — BASIC METABOLIC PANEL WITH GFR
BUN/Creatinine Ratio: 13 (ref 10–24)
BUN: 19 mg/dL (ref 8–27)
CO2: 26 mmol/L (ref 20–29)
Calcium: 9.8 mg/dL (ref 8.6–10.2)
Chloride: 92 mmol/L — ABNORMAL LOW (ref 96–106)
Creatinine, Ser: 1.44 mg/dL — ABNORMAL HIGH (ref 0.76–1.27)
Glucose: 164 mg/dL — ABNORMAL HIGH (ref 70–99)
Potassium: 3.9 mmol/L (ref 3.5–5.2)
Sodium: 142 mmol/L (ref 134–144)
eGFR: 51 mL/min/1.73 — ABNORMAL LOW (ref >59)

## 2024-10-15 LAB — MAGNESIUM: Magnesium: 1.2 mg/dL — ABNORMAL LOW (ref 1.6–2.3)

## 2024-10-18 ENCOUNTER — Encounter (HOSPITAL_COMMUNITY): Payer: Self-pay | Admitting: Medical

## 2024-10-18 ENCOUNTER — Ambulatory Visit (HOSPITAL_BASED_OUTPATIENT_CLINIC_OR_DEPARTMENT_OTHER): Payer: Self-pay | Admitting: Anesthesiology

## 2024-10-18 ENCOUNTER — Ambulatory Visit (HOSPITAL_COMMUNITY)
Admission: RE | Admit: 2024-10-18 | Discharge: 2024-10-18 | Disposition: A | Discharge status: Home / Self Care | Source: Ambulatory Visit | Attending: Urology | Admitting: Urology

## 2024-10-18 ENCOUNTER — Encounter (HOSPITAL_COMMUNITY): Admission: RE | Disposition: A | Payer: Self-pay | Source: Ambulatory Visit | Attending: Urology

## 2024-10-18 ENCOUNTER — Other Ambulatory Visit: Payer: Self-pay

## 2024-10-18 ENCOUNTER — Encounter (HOSPITAL_COMMUNITY): Payer: Self-pay | Admitting: Urology

## 2024-10-18 DIAGNOSIS — K449 Diaphragmatic hernia without obstruction or gangrene: Secondary | ICD-10-CM | POA: Diagnosis not present

## 2024-10-18 DIAGNOSIS — Z79899 Other long term (current) drug therapy: Secondary | ICD-10-CM | POA: Diagnosis not present

## 2024-10-18 DIAGNOSIS — N401 Enlarged prostate with lower urinary tract symptoms: Secondary | ICD-10-CM

## 2024-10-18 DIAGNOSIS — I5032 Chronic diastolic (congestive) heart failure: Secondary | ICD-10-CM | POA: Insufficient documentation

## 2024-10-18 DIAGNOSIS — G4733 Obstructive sleep apnea (adult) (pediatric): Secondary | ICD-10-CM | POA: Diagnosis not present

## 2024-10-18 DIAGNOSIS — I110 Hypertensive heart disease with heart failure: Secondary | ICD-10-CM | POA: Diagnosis not present

## 2024-10-18 DIAGNOSIS — E1169 Type 2 diabetes mellitus with other specified complication: Secondary | ICD-10-CM | POA: Diagnosis not present

## 2024-10-18 DIAGNOSIS — E782 Mixed hyperlipidemia: Secondary | ICD-10-CM | POA: Insufficient documentation

## 2024-10-18 DIAGNOSIS — K219 Gastro-esophageal reflux disease without esophagitis: Secondary | ICD-10-CM | POA: Diagnosis not present

## 2024-10-18 DIAGNOSIS — I10 Essential (primary) hypertension: Secondary | ICD-10-CM | POA: Diagnosis not present

## 2024-10-18 DIAGNOSIS — I2510 Atherosclerotic heart disease of native coronary artery without angina pectoris: Secondary | ICD-10-CM | POA: Insufficient documentation

## 2024-10-18 DIAGNOSIS — I130 Hypertensive heart and chronic kidney disease with heart failure and stage 1 through stage 4 chronic kidney disease, or unspecified chronic kidney disease: Secondary | ICD-10-CM | POA: Diagnosis not present

## 2024-10-18 DIAGNOSIS — R338 Other retention of urine: Secondary | ICD-10-CM | POA: Diagnosis not present

## 2024-10-18 DIAGNOSIS — Z01818 Encounter for other preprocedural examination: Secondary | ICD-10-CM

## 2024-10-18 DIAGNOSIS — N1831 Chronic kidney disease, stage 3a: Secondary | ICD-10-CM | POA: Diagnosis not present

## 2024-10-18 DIAGNOSIS — E1122 Type 2 diabetes mellitus with diabetic chronic kidney disease: Secondary | ICD-10-CM | POA: Diagnosis not present

## 2024-10-18 DIAGNOSIS — Z87891 Personal history of nicotine dependence: Secondary | ICD-10-CM | POA: Diagnosis not present

## 2024-10-18 HISTORY — PX: CYSTOSCOPY WITH URETHRAL DILATATION: SHX5125

## 2024-10-18 LAB — CBC
HCT: 36 % — ABNORMAL LOW (ref 39.0–52.0)
Hemoglobin: 11.4 g/dL — ABNORMAL LOW (ref 13.0–17.0)
MCH: 29.2 pg (ref 26.0–34.0)
MCHC: 31.7 g/dL (ref 30.0–36.0)
MCV: 92.1 fL (ref 80.0–100.0)
Platelets: 235 K/uL (ref 150–400)
RBC: 3.91 MIL/uL — ABNORMAL LOW (ref 4.22–5.81)
RDW: 15.1 % (ref 11.5–15.5)
WBC: 11.7 K/uL — ABNORMAL HIGH (ref 4.0–10.5)
nRBC: 0 % (ref 0.0–0.2)

## 2024-10-18 LAB — BASIC METABOLIC PANEL WITH GFR
Anion gap: 13 (ref 5–15)
BUN: 24 mg/dL — ABNORMAL HIGH (ref 8–23)
CO2: 32 mmol/L (ref 22–32)
Calcium: 9.5 mg/dL (ref 8.9–10.3)
Chloride: 95 mmol/L — ABNORMAL LOW (ref 98–111)
Creatinine, Ser: 1.39 mg/dL — ABNORMAL HIGH (ref 0.61–1.24)
GFR, Estimated: 53 mL/min — ABNORMAL LOW
Glucose, Bld: 185 mg/dL — ABNORMAL HIGH (ref 70–99)
Potassium: 3.3 mmol/L — ABNORMAL LOW (ref 3.5–5.1)
Sodium: 140 mmol/L (ref 135–145)

## 2024-10-18 LAB — HEMOGLOBIN A1C
Hgb A1c MFr Bld: 6.9 % — ABNORMAL HIGH (ref 4.8–5.6)
Mean Plasma Glucose: 151.33 mg/dL

## 2024-10-18 LAB — GLUCOSE, CAPILLARY
Glucose-Capillary: 169 mg/dL — ABNORMAL HIGH (ref 70–99)
Glucose-Capillary: 174 mg/dL — ABNORMAL HIGH (ref 70–99)

## 2024-10-18 SURGERY — CYSTOSCOPY, WITH URETHRAL DILATION
Anesthesia: Monitor Anesthesia Care | Site: Abdomen

## 2024-10-18 MED ORDER — CHLORHEXIDINE GLUCONATE 0.12 % MT SOLN
15.0000 mL | Freq: Once | OROMUCOSAL | Status: AC
  Administered 2024-10-18: 15 mL via OROMUCOSAL

## 2024-10-18 MED ORDER — FENTANYL CITRATE (PF) 50 MCG/ML IJ SOSY: 25.0000 ug | PREFILLED_SYRINGE | INTRAMUSCULAR | Status: DC | PRN

## 2024-10-18 MED ORDER — PROPOFOL 10 MG/ML IV BOLUS
INTRAVENOUS | Status: DC | PRN
  Administered 2024-10-18: 20 mg via INTRAVENOUS

## 2024-10-18 MED ORDER — HYDRALAZINE HCL 20 MG/ML IJ SOLN
10.0000 mg | Freq: Once | INTRAMUSCULAR | Status: AC | PRN
  Administered 2024-10-18: 10 mg via INTRAVENOUS

## 2024-10-18 MED ORDER — OXYCODONE HCL 5 MG PO TABS: 5.0000 mg | ORAL_TABLET | Freq: Once | ORAL | Status: DC | PRN

## 2024-10-18 MED ORDER — OXYCODONE HCL 5 MG/5ML PO SOLN: 5.0000 mg | Freq: Once | ORAL | Status: DC | PRN

## 2024-10-18 MED ORDER — PROPOFOL 500 MG/50ML IV EMUL
INTRAVENOUS | Status: DC | PRN
  Administered 2024-10-18: 130 ug/kg/min via INTRAVENOUS

## 2024-10-18 MED ORDER — ACETAMINOPHEN 500 MG PO TABS
1000.0000 mg | ORAL_TABLET | Freq: Once | ORAL | Status: AC
  Administered 2024-10-18: 1000 mg via ORAL
  Filled 2024-10-18: qty 2

## 2024-10-18 MED ORDER — ORAL CARE MOUTH RINSE: 15.0000 mL | Freq: Once | OROMUCOSAL | Status: AC

## 2024-10-18 MED ORDER — SODIUM CHLORIDE 0.9 % IR SOLN
Status: DC | PRN
  Administered 2024-10-18: 1000 mL

## 2024-10-18 MED ORDER — CEFAZOLIN SODIUM-DEXTROSE 2-4 GM/100ML-% IV SOLN
2.0000 g | INTRAVENOUS | Status: AC
  Administered 2024-10-18: 2 g via INTRAVENOUS
  Filled 2024-10-18: qty 100

## 2024-10-18 MED ORDER — LACTATED RINGERS IV SOLN: INTRAVENOUS | Status: DC

## 2024-10-18 MED ORDER — AMISULPRIDE (ANTIEMETIC) 5 MG/2ML IV SOLN: 10.0000 mg | Freq: Once | INTRAVENOUS | Status: DC | PRN

## 2024-10-18 MED ORDER — INSULIN ASPART 100 UNIT/ML IJ SOLN
0.0000 [IU] | INTRAMUSCULAR | Status: DC | PRN
  Administered 2024-10-18: 2 [IU] via SUBCUTANEOUS
  Filled 2024-10-18: qty 2

## 2024-10-18 MED ORDER — HYDRALAZINE HCL 20 MG/ML IJ SOLN
INTRAMUSCULAR | Status: AC
  Filled 2024-10-18: qty 1

## 2024-10-18 SURGICAL SUPPLY — 16 items
BAG URINE DRAIN 2000ML AR STRL (UROLOGICAL SUPPLIES) ×1 IMPLANT
BALLOON NEPHROSTOMY (BALLOONS) IMPLANT
CATH FOLEY 2W COUNCIL 5CC 18FR (CATHETERS) IMPLANT
CATH FOLEY 2WAY SLVR 5CC 18FR (CATHETERS) IMPLANT
CATH URETL OPEN END 6FR 70 (CATHETERS) IMPLANT
CLOTH BEACON ORANGE TIMEOUT ST (SAFETY) ×1 IMPLANT
GLOVE BIO SURGEON STRL SZ7.5 (GLOVE) ×1 IMPLANT
GOWN STRL REUS W/ TWL XL LVL3 (GOWN DISPOSABLE) ×1 IMPLANT
GUIDEWIRE ANG ZIPWIRE 038X150 (WIRE) IMPLANT
GUIDEWIRE STR DUAL SENSOR (WIRE) ×1 IMPLANT
KIT TURNOVER KIT A (KITS) ×1 IMPLANT
MANIFOLD NEPTUNE II (INSTRUMENTS) IMPLANT
NS IRRIG 1000ML POUR BTL (IV SOLUTION) IMPLANT
PACK CYSTO (CUSTOM PROCEDURE TRAY) ×1 IMPLANT
SCALPEL HALF MOON BLADE (MISCELLANEOUS) ×1 IMPLANT
WATER STERILE IRR 3000ML UROMA (IV SOLUTION) ×1 IMPLANT

## 2024-10-18 NOTE — Anesthesia Preprocedure Evaluation (Addendum)
 "                                  Anesthesia Evaluation  Patient identified by MRN, date of birth, ID band Patient awake    Reviewed: Allergy & Precautions, NPO status , Patient's Chart, lab work & pertinent test results, reviewed documented beta blocker date and time   Airway Mallampati: III  TM Distance: >3 FB Neck ROM: Full    Dental  (+) Dental Advisory Given, Chipped   Pulmonary sleep apnea , former smoker   Pulmonary exam normal breath sounds clear to auscultation       Cardiovascular hypertension, Pt. on home beta blockers and Pt. on medications + CAD and +CHF  Normal cardiovascular exam+ dysrhythmias Atrial Fibrillation  Rhythm:Regular Rate:Normal  TTE 2023 1. Left ventricular ejection fraction, by estimation, is 55 to 60%. The  left ventricle has normal function. The left ventricle has no regional  wall motion abnormalities. There is mild left ventricular hypertrophy.  Left ventricular diastolic parameters  were normal.   2. Right ventricular systolic function is normal. The right ventricular  size is normal. There is mildly elevated pulmonary artery systolic  pressure. The estimated right ventricular systolic pressure is 39.6 mmHg.   3. Left atrial size was moderately dilated.   4. Right atrial size was mildly dilated.   5. The mitral valve is normal in structure. Mild mitral valve  regurgitation. No evidence of mitral stenosis.   6. The aortic valve is tricuspid. Aortic valve regurgitation is trivial.  Aortic valve sclerosis/calcification is present, without any evidence of  aortic stenosis.   7. The inferior vena cava is dilated in size with <50% respiratory  variability, suggesting right atrial pressure of 15 mmHg.   Cath 2025  Multiple vessel CAD but no good PCI target: -- Native RCA has mild diffuse proximal disease with a large PDA. The small PLV branch has a ostial 70 to 80% stenosis that is relatively small in caliber and not favorable for  PCI based on the ostial location - Native LCx is a large-caliber vessel that has a small OM branch but bifurcates into basically OM 2 and the AV groove branch that terminates as an LPL 1. There is 30% disease in the OM1, but 90% ostial AV groove LCx that is occluded just at the takeoff of LPL 1. The LPL is a large vessel that fills via retrograde filling almost always to the occlusion site roughly 30 mm occlusion.  - The LAD starts as a large-caliber vessel was extensively calcified. There is a small caliber first diagonal branch that is focally occluded and again fills retrograde with 2 branches. The LAD tapers to about a 50 or 60% stenosis distally after giving rise to a small second diagonal branch. No obvious culprit lesion to explain the CT FFR positive findings.  Normal LVEDP  Very tortuous innominate artery making radial catheterization difficult.     Neuro/Psych negative neurological ROS  negative psych ROS   GI/Hepatic Neg liver ROS, hiatal hernia,GERD  ,,  Endo/Other  diabetes, Type 2    Renal/GU Renal InsufficiencyRenal disease  negative genitourinary   Musculoskeletal negative musculoskeletal ROS (+)    Abdominal   Peds  Hematology negative hematology ROS (+)   Anesthesia Other Findings   Reproductive/Obstetrics  Anesthesia Physical Anesthesia Plan  ASA: 3  Anesthesia Plan: MAC   Post-op Pain Management:    Induction: Intravenous  PONV Risk Score and Plan: Propofol infusion and Treatment may vary due to age or medical condition  Airway Management Planned: Natural Airway  Additional Equipment:   Intra-op Plan:   Post-operative Plan:   Informed Consent: I have reviewed the patients History and Physical, chart, labs and discussed the procedure including the risks, benefits and alternatives for the proposed anesthesia with the patient or authorized representative who has indicated his/her understanding  and acceptance.     Dental advisory given  Plan Discussed with: CRNA  Anesthesia Plan Comments:          Anesthesia Quick Evaluation  "

## 2024-10-18 NOTE — H&P (Signed)
 H&P  Chief Complaint: urinary retention  History of Present Illness: 73 YO M with urinary retention managed with SPT desires upsizing to 36F.  Past Medical History:  Diagnosis Date   Accelerated hypertension 03/31/2022   Acute diastolic (congestive) heart failure (HCC) 03/31/2022   Acute on chronic anemia 01/13/2022   AKI (acute kidney injury) 01/13/2022   At risk for fall due to comorbid condition 01/20/2022   B12 deficiency 03/01/2022   Benign hypertension with CKD (chronic kidney disease) stage III [facility]) 12/28/2023   11/22/2023 Nephrology OV note.   11/22/2023 Nephrology OV note.     Bilateral lower extremity edema 01/13/2022   CAD (coronary artery disease) 04/18/2024   Calculus of gallbladder with acute cholecystitis without obstruction 02/28/2022   Cardiomegaly 11/10/2021   Catheter-associated urinary tract infection 05/17/2024   Cerebellar degeneration    CHF (congestive heart failure) (HCC) 03/31/2022   CHF exacerbation (HCC) 03/31/2022   Cholelithiasis 11/10/2021   Chronic diastolic heart failure (HCC) 03/31/2022   Chronic kidney disease, stage 3a [facility]) 12/28/2023   11/22/2023 Nephrology OV note.     Chronic suprapubic catheter (HCC) 12/08/2022   [facility]     Diabetes mellitus without complication (HCC)    DM type 2 with diabetic mixed hyperlipidemia (HCC) 03/04/2020   Does not transfer from wheelchair to toilet 01/20/2022   Drug therapy 02/02/2016   Dysrhythmia    Elevated antinuclear antibody (ANA) level 10/06/2015   Frail elderly 04/07/2022   Gait disorder 09/07/2016   GERD (gastroesophageal reflux disease)    Gout 08/28/2012   Heart murmur    History of 2019 novel coronavirus disease (COVID-19) 10/27/2021   History of colonic polyps 11/14/2011   History of esophageal stricture 05/02/2018   History of hiatal hernia    Hypercholesteremia 03/31/2022   Hypercoagulable state due to persistent atrial fibrillation (HCC) 12/16/2021   Hyperlipidemia     Hypertension    Hypertension, essential, benign 08/28/2012   Hypertensive heart disease 01/05/2022   Hypertensive heart disease with acute on chronic diastolic congestive heart failure (HCC) 03/31/2022   Hypertensive urgency 03/31/2022   Hypokalemia 11/10/2021   Hypomagnesemia 01/13/2022   Intertrigo 09/03/2018   Iron  deficiency anemia due to chronic blood loss 03/01/2022   Long term (current) use of anticoagulants 12/16/2021   Malaise and fatigue 03/31/2022   Muscular deconditioning 12/23/2021   Obesity (BMI 30-39.9) 03/15/2023   Occult GI bleeding 11/06/2017   OSA (obstructive sleep apnea) 01/13/2022   Formatting of this note might be different from the original. WEARS CPAP   Persistent atrial fibrillation (HCC) 11/13/2021   Formatting of this note might be different from the original. Last Assessment & Plan:  Formatting of this note might be different from the original. - Patient noted to be in atrial fibrillation, bradycardia with sinus pauses -Per cardiology, started on apixaban , will need to be stopped prior to his upcoming rectal surgery (stop 5 days prior to the surgery and then resume once cleared by surgery se   Positive ANA (antinuclear antibody) 10/06/2015   Postoperative anemia due to acute blood loss 01/20/2022   Postoperative examination 02/28/2022   Pulmonary edema 03/31/2022   Rectal bleeding 01/14/2022   Resistant hypertension 01/13/2022   RLS (restless legs syndrome) 09/08/2016   Shortness of breath 03/31/2022   Skin cancer    Transaminitis 11/10/2021   Trigger middle finger of left hand 10/07/2015   Type 2 diabetes mellitus with hyperlipidemia (HCC) 11/10/2021   Uninhibited neuropathic bladder, 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  Unspecified atrial fibrillation (HCC) with sinus pauses 11/13/2021   Vitamin D deficiency due to chronic kidney disease 12/29/2023   Past Surgical History:  Procedure Laterality Date   APPENDECTOMY     CATARACT EXTRACTION, BILATERAL      CHOLECYSTECTOMY     HEMORRHOID SURGERY     IR CATHETER TUBE CHANGE  06/06/2022   LEFT HEART CATH AND CORONARY ANGIOGRAPHY N/A 05/07/2024   Procedure: LEFT HEART CATH AND CORONARY ANGIOGRAPHY;  Surgeon: Anner Alm ORN, MD;  Location: [facility] INVASIVE CV LAB;  Service: Cardiovascular;  Laterality: N/A;   POLYPECTOMY     ULNAR NERVE REPAIR Left     Home Medications:  Medications Prior to Admission  Medication Sig Dispense Refill Last Dose/Taking   allopurinol  (ZYLOPRIM ) 100 MG tablet Take 100 mg by mouth 2 (two) times daily.   10/18/2024 at  7:30 AM   Ascorbic Acid  (VITAMIN C PO) Take 1 tablet by mouth in the morning. Gummy   10/18/2024 at  7:30 AM   aspirin  EC 81 MG tablet Take 81 mg by mouth daily with lunch.   10/17/2024   carvedilol  (COREG ) 12.5 MG tablet Take 12.5 mg by mouth 2 (two) times daily with a meal. (Patient taking differently: Take 12.5 mg by mouth in the morning and at bedtime.)   10/18/2024 at  7:30 AM   cloNIDine  (CATAPRES ) 0.3 MG tablet Take 1 tablet (0.3 mg total) by mouth 2 (two) times daily. 180 tablet 3 10/18/2024 at  7:30 AM   cyanocobalamin  (VITAMIN B12) 1000 MCG/ML injection Inject 1,000 mcg into the muscle every 30 (thirty) days.   Past Month   ferrous sulfate  ER (IRON  SLOW RELEASE) 142 (45 Fe) MG TBCR tablet Take 45 mg by mouth every evening.   10/17/2024   furosemide  (LASIX ) 20 MG tablet Take 1 tablet (20 mg total) by mouth daily. Please take an additional 20 mg in the afternoon every other day. (Patient taking differently: Take 20 mg by mouth See admin instructions. TAKE 1 TABLET (40 MG TOTAL) BY MOUTH EVERY TUESDAY, THURSDAY,  AND SUNDAY) 90 tablet 3 10/17/2024   furosemide  (LASIX ) 40 MG tablet Take 40 mg by mouth See admin instructions. TAKE 1 TABLET (40 MG TOTAL) BY MOUTH EVERY MONDAY, WEDNESDAY, FRIDAY AND SATURDAY   10/18/2024 at  7:30 AM   hydrALAZINE  (APRESOLINE ) 100 MG tablet Take 1 tablet (100 mg total) by mouth 3 (three) times daily. 270 tablet 3 10/18/2024 at   7:30 AM   isosorbide  dinitrate (ISORDIL ) 30 MG tablet Take 1 tablet (30 mg total) by mouth 3 (three) times daily. 270 tablet 3 10/18/2024 at  7:30 AM   linagliptin  (TRADJENTA ) 5 MG TABS tablet Take 5 mg by mouth in the morning.   10/17/2024   lovastatin (MEVACOR) 40 MG tablet Take 40 mg by mouth every evening.   10/17/2024   magnesium  oxide (MAG-OX) 400 MG tablet Take 2 tablets (800 mg total) by mouth 2 (two) times daily. (Patient taking differently: Take 400 mg by mouth in the morning, at noon, and at bedtime.) 360 tablet 3 10/18/2024 at  7:30 AM   metFORMIN (GLUCOPHAGE) 500 MG tablet Take 1,000 mg by mouth 2 (two) times daily.   10/17/2024   nitroGLYCERIN  (NITROSTAT ) 0.4 MG SL tablet Place 1 tablet (0.4 mg total) under the tongue every 5 (five) minutes as needed. 25 tablet 6 Taking As Needed   omeprazole (PRILOSEC) 20 MG capsule Take 20 mg by mouth in the morning and at bedtime.  10/18/2024 at  7:30 AM   timolol  (TIMOPTIC ) 0.5 % ophthalmic solution Place 1 drop into both eyes in the morning.   10/18/2024 at  7:30 AM   Vitamin D, Ergocalciferol, (DRISDOL) 1.25 MG (50000 UNIT) CAPS capsule Take 50,000 Units by mouth every Sunday.   10/06/2024   zinc  gluconate 50 MG tablet Take 50 mg by mouth in the morning.   10/18/2024 at  7:30 AM   ACCU-CHEK GUIDE TEST test strip 1 each by Other route.      Accu-Chek Softclix Lancets lancets 1 each daily.      oxaprozin (DAYPRO) 600 MG tablet Take 600 mg by mouth daily as needed (Gout).   More than a month   Allergies: Allergies[1]  Family History  Problem Relation Age of Onset   Stroke Mother    Hypertension Mother    Diabetes Mother    Hyperlipidemia Mother    Colon cancer Father    Healthy Sister    Social History:  reports that he has quit smoking. His smoking use included cigarettes. He has never been exposed to tobacco smoke. He has never used smokeless tobacco. He reports current alcohol use. He reports that he does not use drugs.  ROS: A  complete review of systems was performed.  All systems are negative except for pertinent findings as noted. ROS   Physical Exam:  Vital signs in last 24 hours: Temp:  [98.5 F (36.9 C)] (P) 98.5 F (36.9 C) (12/19 1021) Pulse Rate:  [60-65] 65 (12/19 1100) Resp:  [18] (P) 18 (12/19 1021) BP: (139-184)/(99-111) 184/99 (12/19 1100) SpO2:  [95 %-96 %] 96 % (12/19 1100) Weight:  [117 kg] 117 kg (12/19 1018) General:  Alert and oriented, No acute distress HEENT: Normocephalic, atraumatic Neck: No JVD or lymphadenopathy Cardiovascular: Regular rate and rhythm Lungs: Regular rate and effort Abdomen: Soft, nontender, nondistended, no abdominal masses Back: No CVA tenderness Extremities: No edema Neurologic: Grossly intact  Laboratory Data:  Results for orders placed or performed during the hospital encounter of 10/18/24 (from the past 24 hours)  Basic metabolic panel per protocol     Status: Abnormal   Collection Time: 10/18/24 11:02 AM  Result Value Ref Range   Sodium 140 135 - 145 mmol/L   Potassium 3.3 (L) 3.5 - 5.1 mmol/L   Chloride 95 (L) 98 - 111 mmol/L   CO2 32 22 - 32 mmol/L   Glucose, Bld 185 (H) 70 - 99 mg/dL   BUN 24 (H) 8 - 23 mg/dL   Creatinine, Ser 8.60 (H) 0.61 - 1.24 mg/dL   Calcium  9.5 8.9 - 10.3 mg/dL   GFR, Estimated 53 (L) >60 mL/min   Anion gap 13 5 - 15  CBC per protocol     Status: Abnormal   Collection Time: 10/18/24 11:02 AM  Result Value Ref Range   WBC 11.7 (H) 4.0 - 10.5 K/uL   RBC 3.91 (L) 4.22 - 5.81 MIL/uL   Hemoglobin 11.4 (L) 13.0 - 17.0 g/dL   HCT 63.9 (L) 60.9 - 47.9 %   MCV 92.1 80.0 - 100.0 fL   MCH 29.2 26.0 - 34.0 pg   MCHC 31.7 30.0 - 36.0 g/dL   RDW 84.8 88.4 - 84.4 %   Platelets 235 150 - 400 K/uL   nRBC 0.0 0.0 - 0.2 %   No results found for this or any previous visit (from the past 240 hours). Creatinine: Recent Labs    10/14/24 1159 10/18/24 1102  CREATININE  1.44* 1.39*    Impression/Assessment:  Urinary  retention  Plan:  Proceed with SPT upsizing to 77F by dilating SPT tract  Sherwood JONETTA Edison, III 10/18/2024, 12:28 PM      [1]  Allergies Allergen Reactions   Victoza [Liraglutide]     CAN NOT TAKE BECAUSE A SIDE EFFECT IS PANCREATITIS AND HE HAS A HISTORY Contraindicated due to pancreatitis    Hydrocodone     MAKES FEEL SPACEY AND LIGHT HEADED   Other     BANDAID  CAUSES RASH   Tape Other (See Comments)    Skin irritation, welts   Metronidazole Nausea And Vomiting     GI Upset (intolerance) AND DEPRESSION ;  TABLETS Does fine w/ IV Flagyl

## 2024-10-18 NOTE — Op Note (Signed)
 Operative Note  Preoperative diagnosis:  1.  Urinary retention  Postoperative diagnosis: 1.  Urinary retention  Procedure(s): 1.  Cystoscopy 2.  Suprapubic tube dilation with upsizing of suprapubic catheter  Surgeon: Sherwood Edison, MD  Assistants: None  Anesthesia: General  Complications: None immediate  EBL: Minimal  Specimens: 1.  None  Drains/Catheters: 1.  18 French council tip catheter  Intraoperative findings: 1.  Normal anterior urethra 2.  Obstructing prostate 3.  Bladder mucosa with some catheter edema but no tumors stones or masses.  Suprapubic tube that was replaced was in proper position.  Indication: 73 year old male with urinary retention managed with a suprapubic tube desires upsizing  Description of procedure:  The patient was identified and consent was obtained.  The patient was taken to the operating room and placed in the supine position.  The patient was placed under general anesthesia.  Perioperative antibiotics were administered.  Patient was prepped and draped in a standard sterile fashion and a timeout was performed.  A wire was advanced through the suprapubic tube tract.  I then dilated with sounds over the wire from 14 French up to 20 French.  I then passed an 33 French council tip catheter over the wire and into the bladder and remove the wire.  10 cc of sterile water was instilled into the catheter balloon.  I performed a cystoscopy with the flexible cystoscope with the findings noted above.  Suprapubic tube was in good position and I did not see any tumors stones or masses.  This concluded the operation.  Patient tolerated the procedure well was stable postoperatively.  Plan: Follow-up in 4 to 6 weeks for suprapubic tube exchange in the clinic

## 2024-10-18 NOTE — Transfer of Care (Signed)
 Immediate Anesthesia Transfer of Care Note  Patient: [facility]  Procedure(s) Performed: CYSTOSCOPY, WITH URETHRAL DILATION (Abdomen)  Patient Location: PACU  Anesthesia Type:MAC  Level of Consciousness: sedated  Airway & Oxygen Therapy: Patient Spontanous Breathing and Patient connected to face mask oxygen  Post-op Assessment: Report given to RN and Post -op Vital signs reviewed and stable  Post vital signs: Reviewed and stable  Last Vitals:  Vitals Value Taken Time  BP    Temp    Pulse 75 10/18/24 13:13  Resp 14 10/18/24 13:13  SpO2 90 % 10/18/24 13:13  Vitals shown include unfiled device data.  Last Pain:  Vitals:   10/18/24 1021  TempSrc: (P) Oral         Complications: No notable events documented.

## 2024-10-18 NOTE — Discharge Instructions (Signed)
 You may have some blood in the urine. This is ok as long as the catheter drains well

## 2024-10-21 ENCOUNTER — Encounter (HOSPITAL_COMMUNITY): Payer: Self-pay | Admitting: Urology

## 2024-10-21 NOTE — Anesthesia Postprocedure Evaluation (Signed)
"   Anesthesia Post Note  Patient: [facility]  Procedure(s) Performed: CYSTOSCOPY, WITH URETHRAL DILATION (Abdomen)     Patient location during evaluation: PACU Anesthesia Type: MAC Level of consciousness: awake and alert Pain management: pain level controlled Vital Signs Assessment: post-procedure vital signs reviewed and stable Respiratory status: spontaneous breathing, nonlabored ventilation, respiratory function stable and patient connected to nasal cannula oxygen Cardiovascular status: blood pressure returned to baseline and stable Postop Assessment: no apparent nausea or vomiting Anesthetic complications: no   No notable events documented.  Last Vitals:  Vitals:   10/18/24 1416 10/18/24 1433  BP: (!) 194/92 (!) 179/92  Pulse:    Resp:    Temp:    SpO2:      Last Pain:  Vitals:   10/18/24 1357  TempSrc:   PainSc: 0-No pain                 Ariel Dimitri L Therin Vetsch      "

## 2024-10-23 LAB — GLUCOSE, CAPILLARY: Glucose-Capillary: 180 mg/dL — ABNORMAL HIGH (ref 70–99)

## 2024-11-18 ENCOUNTER — Other Ambulatory Visit: Payer: Self-pay | Admitting: Oncology

## 2024-11-18 NOTE — Progress Notes (Unsigned)
 " [facility] [address] [place],  [place]  [address] [phone]  Clinic Day:  11/19/2024  Referring physician: Magdaline Debby HERO, MD   HISTORY OF PRESENT ILLNESS:  The patient is a 74 y.o. male with anemia secondary to mild renal insufficiency, as well as previous B12 deficiency.  He comes in today for routine follow-up.  Since his last visit, the patient has been doing fairly well.  He continues to take monthly B12 injections to ensure adequate fortification of his cobalamin stores.  He denies having increased fatigue or any overt forms of blood loss which concern him for worsening anemia.  PHYSICAL EXAM:  Blood pressure (!) 169/86, pulse 68, temperature (!) 97.5 F (36.4 C), temperature source Oral, resp. rate 16, height 5' 11 (1.803 m), SpO2 96%. Wt Readings from Last 3 Encounters:  10/18/24 258 lb (117 kg)  10/07/24 258 lb (117 kg)  08/21/24 258 lb (117 kg)   Body mass index is 35.98 kg/m. Performance status (ECOG): 2 - Symptomatic, <50% confined to bed Physical Exam Constitutional:      Appearance: Normal appearance. He is not ill-appearing.     Comments: A pleasant older gentleman who is in a wheelchair.  He has a chronically ill appearance.  HENT:     Mouth/Throat:     Mouth: Mucous membranes are moist.     Pharynx: Oropharynx is clear. No oropharyngeal exudate or posterior oropharyngeal erythema.  Cardiovascular:     Rate and Rhythm: Normal rate and regular rhythm.     Heart sounds: No murmur heard.    No friction rub. No gallop.  Pulmonary:     Effort: Pulmonary effort is normal. No respiratory distress.     Breath sounds: Normal breath sounds. No wheezing, rhonchi or rales.  Abdominal:     General: Bowel sounds are normal. There is no distension.     Palpations: Abdomen is soft. There is no mass.     Tenderness: There is no abdominal tenderness.  Musculoskeletal:        General: No swelling.     Right lower leg: No edema.      Left lower leg: No edema.  Lymphadenopathy:     Cervical: No cervical adenopathy.     Upper Body:     Right upper body: No supraclavicular or axillary adenopathy.     Left upper body: No supraclavicular or axillary adenopathy.     Lower Body: No right inguinal adenopathy. No left inguinal adenopathy.  Skin:    General: Skin is warm.     Coloration: Skin is not jaundiced.     Findings: No lesion or rash.  Neurological:     General: No focal deficit present.     Mental Status: He is alert and oriented to person, place, and time. Mental status is at baseline.  Psychiatric:        Mood and Affect: Mood normal.        Behavior: Behavior normal.        Thought Content: Thought content normal.    LABS:      Latest Ref Rng & Units 11/19/2024   12:54 PM 10/18/2024   11:02 AM 08/21/2024    1:44 PM  CBC  WBC 4.0 - 10.5 K/uL 10.8  11.7  11.8   Hemoglobin 13.0 - 17.0 g/dL 89.0  88.5  89.1   Hematocrit 39.0 - 52.0 % 34.3  36.0  34.8   Platelets 150 - 400 K/uL  204  235  205       Latest Ref Rng & Units 11/19/2024   12:54 PM 10/18/2024   11:02 AM 10/14/2024   11:59 AM  CMP  Glucose 70 - 99 mg/dL 812  814  835   BUN 8 - 23 mg/dL 20  24  19    Creatinine 0.61 - 1.24 mg/dL 8.49  8.60  8.55   Sodium 135 - 145 mmol/L 139  140  142   Potassium 3.5 - 5.1 mmol/L 3.9  3.3  3.9   Chloride 98 - 111 mmol/L 95  95  92   CO2 22 - 32 mmol/L 32  32  26   Calcium  8.9 - 10.3 mg/dL 89.9  9.5  9.8   Total Protein 6.5 - 8.1 g/dL 7.5     Total Bilirubin 0.0 - 1.2 mg/dL 0.4     Alkaline Phos 38 - 126 U/L 51     AST 15 - 41 U/L 19     ALT 0 - 44 U/L 8      Iron  B12 folate levels pending  ASSESSMENT & PLAN:  Assessment/Plan:  A 74 y.o. male with anemia secondary to  medical problems, including mild renal insufficiency and previous B12 deficiency.  I am pleased as his hemoglobin remains above 10.  He knows to continue taking his B12 shots on a monthly basis to ensure adequate fortification of his  cobalamin stores.  Clinically, he appears to be doing fairly well.  I will see him back in 6 months for repeat clinical assessment.  The patient understands all the plans discussed today and is in agreement with them.    Misha Antonini DELENA Kerns, MD       "

## 2024-11-19 ENCOUNTER — Other Ambulatory Visit: Payer: Self-pay | Admitting: Oncology

## 2024-11-19 ENCOUNTER — Inpatient Hospital Stay

## 2024-11-19 ENCOUNTER — Inpatient Hospital Stay: Attending: Oncology | Admitting: Oncology

## 2024-11-19 VITALS — BP 169/86 | HR 68 | Temp 97.5°F | Resp 16 | Ht 71.0 in

## 2024-11-19 DIAGNOSIS — D500 Iron deficiency anemia secondary to blood loss (chronic): Secondary | ICD-10-CM

## 2024-11-19 DIAGNOSIS — N289 Disorder of kidney and ureter, unspecified: Secondary | ICD-10-CM | POA: Diagnosis present

## 2024-11-19 DIAGNOSIS — E538 Deficiency of other specified B group vitamins: Secondary | ICD-10-CM | POA: Insufficient documentation

## 2024-11-19 DIAGNOSIS — D649 Anemia, unspecified: Secondary | ICD-10-CM | POA: Insufficient documentation

## 2024-11-19 LAB — CBC WITH DIFFERENTIAL (CANCER CENTER ONLY)
Abs Immature Granulocytes: 0.07 K/uL (ref 0.00–0.07)
Basophils Absolute: 0.1 K/uL (ref 0.0–0.1)
Basophils Relative: 1 %
Eosinophils Absolute: 0.3 K/uL (ref 0.0–0.5)
Eosinophils Relative: 3 %
HCT: 34.3 % — ABNORMAL LOW (ref 39.0–52.0)
Hemoglobin: 10.9 g/dL — ABNORMAL LOW (ref 13.0–17.0)
Immature Granulocytes: 1 %
Lymphocytes Relative: 11 %
Lymphs Abs: 1.2 K/uL (ref 0.7–4.0)
MCH: 28.9 pg (ref 26.0–34.0)
MCHC: 31.8 g/dL (ref 30.0–36.0)
MCV: 91 fL (ref 80.0–100.0)
Monocytes Absolute: 0.9 K/uL (ref 0.1–1.0)
Monocytes Relative: 8 %
Neutro Abs: 8.3 K/uL — ABNORMAL HIGH (ref 1.7–7.7)
Neutrophils Relative %: 76 %
Platelet Count: 204 K/uL (ref 150–400)
RBC: 3.77 MIL/uL — ABNORMAL LOW (ref 4.22–5.81)
RDW: 15 % (ref 11.5–15.5)
WBC Count: 10.8 K/uL — ABNORMAL HIGH (ref 4.0–10.5)
nRBC: 0 % (ref 0.0–0.2)

## 2024-11-19 LAB — VITAMIN B12: Vitamin B-12: 667 pg/mL (ref 180–914)

## 2024-11-19 LAB — IRON AND TIBC
Iron: 44 ug/dL — ABNORMAL LOW (ref 45–182)
Saturation Ratios: 14 % — ABNORMAL LOW (ref 17.9–39.5)
TIBC: 325 ug/dL (ref 250–450)
UIBC: 281 ug/dL

## 2024-11-19 LAB — CMP (CANCER CENTER ONLY)
ALT: 8 U/L (ref 0–44)
AST: 19 U/L (ref 15–41)
Albumin: 4 g/dL (ref 3.5–5.0)
Alkaline Phosphatase: 51 U/L (ref 38–126)
Anion gap: 12 (ref 5–15)
BUN: 20 mg/dL (ref 8–23)
CO2: 32 mmol/L (ref 22–32)
Calcium: 10 mg/dL (ref 8.9–10.3)
Chloride: 95 mmol/L — ABNORMAL LOW (ref 98–111)
Creatinine: 1.5 mg/dL — ABNORMAL HIGH (ref 0.61–1.24)
GFR, Estimated: 49 mL/min — ABNORMAL LOW
Glucose, Bld: 187 mg/dL — ABNORMAL HIGH (ref 70–99)
Potassium: 3.9 mmol/L (ref 3.5–5.1)
Sodium: 139 mmol/L (ref 135–145)
Total Bilirubin: 0.4 mg/dL (ref 0.0–1.2)
Total Protein: 7.5 g/dL (ref 6.5–8.1)

## 2024-11-19 LAB — FERRITIN: Ferritin: 74 ng/mL (ref 24–336)

## 2024-11-19 LAB — FOLATE: Folate: 16.6 ng/mL

## 2024-11-19 MED ORDER — CYANOCOBALAMIN 1000 MCG/ML IJ SOLN: 1000.0000 ug | INTRAMUSCULAR | 11 refills | Status: AC

## 2024-11-20 ENCOUNTER — Other Ambulatory Visit: Payer: Self-pay | Admitting: Oncology

## 2024-11-20 ENCOUNTER — Encounter: Payer: Self-pay | Admitting: Oncology

## 2024-11-20 ENCOUNTER — Telehealth: Payer: Self-pay | Admitting: Oncology

## 2024-11-20 ENCOUNTER — Telehealth: Payer: Self-pay

## 2024-11-20 NOTE — Telephone Encounter (Signed)
 Dr Ezzard: please let pt know his iron  levels are trending lower to where I believe he needs IV iron . Please arrange for him to get IV iron  and have him see me back in 4 months. thx    Latest Reference Range & Units 11/19/24 12:54 11/19/24 12:55  Iron  45 - 182 ug/dL  44 (L)  UIBC ug/dL  718  TIBC 749 - 549 ug/dL  674  Saturation Ratios 17.9 - 39.5 %  14 (L)  Ferritin 24 - 336 ng/mL  74  Folate >5.9 ng/mL  16.6  Vitamin B12 180 - 914 pg/mL 667   (L): Data is abnormally low

## 2024-11-20 NOTE — Telephone Encounter (Signed)
 Patient has been scheduled. Aware of appt date and time.   Scheduling Message Entered by [place], AMY W on 11/20/2024 at 10:10 AM Priority: High INFUSION 1HR30MIN (90)  Department: [facility]  Provider: Ezzard Valaria LABOR, MD  Appointment Notes:  Wife requests that we give the iron  as least doses/days as possible- as she has to use [place] lift to get him up & ready. It is an ordeal. She prefers appt @ least 2 wks out from today.  I notified Devere P,RPH of above.

## 2024-12-09 ENCOUNTER — Ambulatory Visit: Admitting: Podiatry

## 2024-12-10 ENCOUNTER — Inpatient Hospital Stay: Attending: Oncology

## 2024-12-11 ENCOUNTER — Inpatient Hospital Stay

## 2024-12-12 ENCOUNTER — Inpatient Hospital Stay

## 2024-12-13 ENCOUNTER — Inpatient Hospital Stay

## 2024-12-17 ENCOUNTER — Inpatient Hospital Stay

## 2025-01-03 ENCOUNTER — Ambulatory Visit: Admitting: Podiatry

## 2025-02-17 ENCOUNTER — Ambulatory Visit

## 2025-05-20 ENCOUNTER — Inpatient Hospital Stay: Admitting: Oncology

## 2025-05-20 ENCOUNTER — Inpatient Hospital Stay
